# Patient Record
Sex: Male | Born: 1960 | Race: White | Hispanic: No | Marital: Single | Smoking: Former smoker
Health system: Southern US, Community
[De-identification: ages and names within clinical notes are randomized; demographics above are authoritative.]

## PROBLEM LIST (undated history)

## (undated) DIAGNOSIS — T50902A Poisoning by unspecified drugs, medicaments and biological substances, intentional self-harm, initial encounter: Secondary | ICD-10-CM

## (undated) DIAGNOSIS — I10 Essential (primary) hypertension: Secondary | ICD-10-CM

## (undated) DIAGNOSIS — F329 Major depressive disorder, single episode, unspecified: Secondary | ICD-10-CM

## (undated) DIAGNOSIS — K219 Gastro-esophageal reflux disease without esophagitis: Secondary | ICD-10-CM

## (undated) DIAGNOSIS — F32A Depression, unspecified: Secondary | ICD-10-CM

## (undated) DIAGNOSIS — G8929 Other chronic pain: Secondary | ICD-10-CM

## (undated) DIAGNOSIS — J449 Chronic obstructive pulmonary disease, unspecified: Secondary | ICD-10-CM

## (undated) DIAGNOSIS — G709 Myoneural disorder, unspecified: Secondary | ICD-10-CM

## (undated) DIAGNOSIS — F419 Anxiety disorder, unspecified: Secondary | ICD-10-CM

## (undated) HISTORY — PX: ANKLE SURGERY: SHX546

## (undated) HISTORY — PX: HERNIA REPAIR: SHX51

## (undated) HISTORY — PX: OTHER SURGICAL HISTORY: SHX169

---

## 1999-01-15 ENCOUNTER — Ambulatory Visit (HOSPITAL_COMMUNITY): Admission: RE | Admit: 1999-01-15 | Discharge: 1999-01-15 | Payer: Self-pay | Admitting: Gastroenterology

## 1999-07-02 ENCOUNTER — Other Ambulatory Visit: Admission: RE | Admit: 1999-07-02 | Discharge: 1999-07-02 | Payer: Self-pay

## 1999-11-01 ENCOUNTER — Encounter: Payer: Self-pay | Admitting: Neurosurgery

## 1999-11-05 ENCOUNTER — Encounter: Payer: Self-pay | Admitting: Neurological Surgery

## 1999-11-05 ENCOUNTER — Inpatient Hospital Stay (HOSPITAL_COMMUNITY): Admission: RE | Admit: 1999-11-05 | Discharge: 1999-11-05 | Payer: Self-pay | Admitting: Neurological Surgery

## 1999-11-21 ENCOUNTER — Encounter: Payer: Self-pay | Admitting: Neurological Surgery

## 1999-11-21 ENCOUNTER — Encounter: Admission: RE | Admit: 1999-11-21 | Discharge: 1999-11-21 | Payer: Self-pay | Admitting: Neurological Surgery

## 1999-12-27 ENCOUNTER — Encounter: Payer: Self-pay | Admitting: Neurological Surgery

## 1999-12-27 ENCOUNTER — Encounter: Admission: RE | Admit: 1999-12-27 | Discharge: 1999-12-27 | Payer: Self-pay | Admitting: Neurological Surgery

## 2000-02-11 ENCOUNTER — Encounter: Payer: Self-pay | Admitting: Neurological Surgery

## 2000-02-11 ENCOUNTER — Ambulatory Visit (HOSPITAL_COMMUNITY): Admission: RE | Admit: 2000-02-11 | Discharge: 2000-02-11 | Payer: Self-pay | Admitting: Neurological Surgery

## 2000-11-13 ENCOUNTER — Encounter: Admission: RE | Admit: 2000-11-13 | Discharge: 2000-11-13 | Payer: Self-pay | Admitting: Neurological Surgery

## 2000-11-13 ENCOUNTER — Encounter: Payer: Self-pay | Admitting: Neurological Surgery

## 2000-11-28 ENCOUNTER — Ambulatory Visit (HOSPITAL_COMMUNITY): Admission: RE | Admit: 2000-11-28 | Discharge: 2000-11-28 | Payer: Self-pay | Admitting: Neurological Surgery

## 2000-11-28 ENCOUNTER — Encounter: Payer: Self-pay | Admitting: Neurological Surgery

## 2001-07-07 ENCOUNTER — Encounter: Admission: RE | Admit: 2001-07-07 | Discharge: 2001-10-05 | Payer: Self-pay

## 2001-09-12 ENCOUNTER — Encounter: Payer: Self-pay | Admitting: Emergency Medicine

## 2001-09-12 ENCOUNTER — Emergency Department (HOSPITAL_COMMUNITY): Admission: EM | Admit: 2001-09-12 | Discharge: 2001-09-12 | Payer: Self-pay | Admitting: Emergency Medicine

## 2001-09-16 ENCOUNTER — Ambulatory Visit (HOSPITAL_BASED_OUTPATIENT_CLINIC_OR_DEPARTMENT_OTHER): Admission: RE | Admit: 2001-09-16 | Discharge: 2001-09-16 | Payer: Self-pay | Admitting: Urology

## 2001-09-16 ENCOUNTER — Encounter: Payer: Self-pay | Admitting: Urology

## 2002-12-12 ENCOUNTER — Emergency Department (HOSPITAL_COMMUNITY): Admission: EM | Admit: 2002-12-12 | Discharge: 2002-12-12 | Payer: Self-pay | Admitting: Emergency Medicine

## 2003-03-14 ENCOUNTER — Emergency Department (HOSPITAL_COMMUNITY): Admission: EM | Admit: 2003-03-14 | Discharge: 2003-03-14 | Payer: Self-pay | Admitting: Emergency Medicine

## 2003-03-23 ENCOUNTER — Emergency Department (HOSPITAL_COMMUNITY): Admission: EM | Admit: 2003-03-23 | Discharge: 2003-03-23 | Payer: Self-pay | Admitting: Emergency Medicine

## 2003-09-28 ENCOUNTER — Emergency Department (HOSPITAL_COMMUNITY): Admission: EM | Admit: 2003-09-28 | Discharge: 2003-09-28 | Payer: Self-pay

## 2003-10-11 ENCOUNTER — Emergency Department (HOSPITAL_COMMUNITY): Admission: EM | Admit: 2003-10-11 | Discharge: 2003-10-11 | Payer: Self-pay | Admitting: Emergency Medicine

## 2003-10-17 ENCOUNTER — Encounter: Admission: RE | Admit: 2003-10-17 | Discharge: 2003-10-17 | Payer: Self-pay | Admitting: Urology

## 2003-10-18 ENCOUNTER — Ambulatory Visit (HOSPITAL_BASED_OUTPATIENT_CLINIC_OR_DEPARTMENT_OTHER): Admission: RE | Admit: 2003-10-18 | Discharge: 2003-10-18 | Payer: Self-pay | Admitting: Urology

## 2003-10-18 ENCOUNTER — Ambulatory Visit (HOSPITAL_COMMUNITY): Admission: RE | Admit: 2003-10-18 | Discharge: 2003-10-18 | Payer: Self-pay | Admitting: Urology

## 2004-09-16 ENCOUNTER — Emergency Department (HOSPITAL_COMMUNITY): Admission: EM | Admit: 2004-09-16 | Discharge: 2004-09-16 | Payer: Self-pay | Admitting: *Deleted

## 2004-09-18 ENCOUNTER — Emergency Department (HOSPITAL_COMMUNITY): Admission: EM | Admit: 2004-09-18 | Discharge: 2004-09-18 | Payer: Self-pay | Admitting: Family Medicine

## 2004-09-23 ENCOUNTER — Emergency Department (HOSPITAL_COMMUNITY): Admission: EM | Admit: 2004-09-23 | Discharge: 2004-09-23 | Payer: Self-pay | Admitting: Emergency Medicine

## 2004-10-03 ENCOUNTER — Emergency Department (HOSPITAL_COMMUNITY): Admission: EM | Admit: 2004-10-03 | Discharge: 2004-10-03 | Payer: Self-pay | Admitting: Emergency Medicine

## 2004-10-13 ENCOUNTER — Emergency Department (HOSPITAL_COMMUNITY): Admission: EM | Admit: 2004-10-13 | Discharge: 2004-10-13 | Payer: Self-pay | Admitting: Emergency Medicine

## 2005-11-04 ENCOUNTER — Observation Stay (HOSPITAL_COMMUNITY): Admission: EM | Admit: 2005-11-04 | Discharge: 2005-11-04 | Payer: Self-pay | Admitting: Emergency Medicine

## 2006-01-21 ENCOUNTER — Emergency Department (HOSPITAL_COMMUNITY): Admission: EM | Admit: 2006-01-21 | Discharge: 2006-01-21 | Payer: Self-pay | Admitting: Emergency Medicine

## 2006-02-15 ENCOUNTER — Emergency Department (HOSPITAL_COMMUNITY): Admission: EM | Admit: 2006-02-15 | Discharge: 2006-02-15 | Payer: Self-pay | Admitting: Emergency Medicine

## 2006-03-29 ENCOUNTER — Emergency Department (HOSPITAL_COMMUNITY): Admission: EM | Admit: 2006-03-29 | Discharge: 2006-03-29 | Payer: Self-pay | Admitting: Emergency Medicine

## 2006-06-05 ENCOUNTER — Emergency Department (HOSPITAL_COMMUNITY): Admission: EM | Admit: 2006-06-05 | Discharge: 2006-06-06 | Payer: Self-pay | Admitting: Emergency Medicine

## 2006-06-20 ENCOUNTER — Emergency Department (HOSPITAL_COMMUNITY): Admission: EM | Admit: 2006-06-20 | Discharge: 2006-06-21 | Payer: Self-pay | Admitting: Emergency Medicine

## 2006-07-13 ENCOUNTER — Ambulatory Visit (HOSPITAL_COMMUNITY): Admission: RE | Admit: 2006-07-13 | Discharge: 2006-07-13 | Payer: Self-pay | Admitting: Gastroenterology

## 2006-07-13 ENCOUNTER — Encounter (INDEPENDENT_AMBULATORY_CARE_PROVIDER_SITE_OTHER): Payer: Self-pay | Admitting: Specialist

## 2006-07-13 ENCOUNTER — Emergency Department (HOSPITAL_COMMUNITY): Admission: EM | Admit: 2006-07-13 | Discharge: 2006-07-13 | Payer: Self-pay | Admitting: Emergency Medicine

## 2006-10-08 ENCOUNTER — Emergency Department (HOSPITAL_COMMUNITY): Admission: EM | Admit: 2006-10-08 | Discharge: 2006-10-09 | Payer: Self-pay | Admitting: Emergency Medicine

## 2006-11-18 ENCOUNTER — Emergency Department (HOSPITAL_COMMUNITY): Admission: EM | Admit: 2006-11-18 | Discharge: 2006-11-18 | Payer: Self-pay | Admitting: Emergency Medicine

## 2007-06-21 ENCOUNTER — Emergency Department (HOSPITAL_COMMUNITY): Admission: EM | Admit: 2007-06-21 | Discharge: 2007-06-22 | Payer: Self-pay | Admitting: Emergency Medicine

## 2007-12-13 ENCOUNTER — Emergency Department (HOSPITAL_COMMUNITY): Admission: EM | Admit: 2007-12-13 | Discharge: 2007-12-13 | Payer: Self-pay | Admitting: Emergency Medicine

## 2008-01-05 ENCOUNTER — Emergency Department (HOSPITAL_COMMUNITY): Admission: EM | Admit: 2008-01-05 | Discharge: 2008-01-06 | Payer: Self-pay | Admitting: Emergency Medicine

## 2008-01-06 ENCOUNTER — Ambulatory Visit: Payer: Self-pay | Admitting: Psychiatry

## 2008-01-06 ENCOUNTER — Inpatient Hospital Stay (HOSPITAL_COMMUNITY): Admission: RE | Admit: 2008-01-06 | Discharge: 2008-01-17 | Payer: Self-pay | Admitting: Psychiatry

## 2008-04-13 ENCOUNTER — Emergency Department (HOSPITAL_COMMUNITY): Admission: EM | Admit: 2008-04-13 | Discharge: 2008-04-13 | Payer: Self-pay | Admitting: *Deleted

## 2008-06-22 ENCOUNTER — Emergency Department (HOSPITAL_COMMUNITY): Admission: EM | Admit: 2008-06-22 | Discharge: 2008-06-23 | Payer: Self-pay | Admitting: *Deleted

## 2008-06-23 ENCOUNTER — Ambulatory Visit: Payer: Self-pay | Admitting: Psychiatry

## 2008-06-23 ENCOUNTER — Inpatient Hospital Stay (HOSPITAL_COMMUNITY): Admission: RE | Admit: 2008-06-23 | Discharge: 2008-07-07 | Payer: Self-pay | Admitting: Psychiatry

## 2008-07-11 ENCOUNTER — Emergency Department (HOSPITAL_COMMUNITY): Admission: EM | Admit: 2008-07-11 | Discharge: 2008-07-11 | Payer: Self-pay | Admitting: Emergency Medicine

## 2008-07-11 ENCOUNTER — Inpatient Hospital Stay (HOSPITAL_COMMUNITY): Admission: RE | Admit: 2008-07-11 | Discharge: 2008-07-26 | Payer: Self-pay | Admitting: Psychiatry

## 2009-04-03 ENCOUNTER — Emergency Department (HOSPITAL_COMMUNITY): Admission: EM | Admit: 2009-04-03 | Discharge: 2009-04-03 | Payer: Self-pay | Admitting: Emergency Medicine

## 2009-04-03 ENCOUNTER — Ambulatory Visit: Payer: Self-pay | Admitting: *Deleted

## 2009-04-04 ENCOUNTER — Inpatient Hospital Stay (HOSPITAL_COMMUNITY): Admission: RE | Admit: 2009-04-04 | Discharge: 2009-04-11 | Payer: Self-pay | Admitting: *Deleted

## 2009-04-11 ENCOUNTER — Emergency Department (HOSPITAL_COMMUNITY): Admission: EM | Admit: 2009-04-11 | Discharge: 2009-04-12 | Payer: Self-pay | Admitting: Emergency Medicine

## 2009-04-12 ENCOUNTER — Inpatient Hospital Stay (HOSPITAL_COMMUNITY): Admission: RE | Admit: 2009-04-12 | Discharge: 2009-04-25 | Payer: Self-pay | Admitting: *Deleted

## 2009-04-12 ENCOUNTER — Ambulatory Visit: Payer: Self-pay | Admitting: *Deleted

## 2009-06-01 ENCOUNTER — Emergency Department (HOSPITAL_COMMUNITY): Admission: EM | Admit: 2009-06-01 | Discharge: 2009-06-02 | Payer: Self-pay | Admitting: Emergency Medicine

## 2009-06-06 ENCOUNTER — Ambulatory Visit: Payer: Self-pay | Admitting: Vascular Surgery

## 2009-06-06 ENCOUNTER — Encounter (INDEPENDENT_AMBULATORY_CARE_PROVIDER_SITE_OTHER): Payer: Self-pay | Admitting: Internal Medicine

## 2009-06-06 ENCOUNTER — Inpatient Hospital Stay (HOSPITAL_COMMUNITY): Admission: EM | Admit: 2009-06-06 | Discharge: 2009-06-08 | Payer: Self-pay | Admitting: Emergency Medicine

## 2011-01-10 LAB — COMPREHENSIVE METABOLIC PANEL
ALT: 26 U/L (ref 0–53)
ALT: 27 U/L (ref 0–53)
AST: 18 U/L (ref 0–37)
AST: 19 U/L (ref 0–37)
Albumin: 3.8 g/dL (ref 3.5–5.2)
Alkaline Phosphatase: 65 U/L (ref 39–117)
BUN: 17 mg/dL (ref 6–23)
CO2: 28 mEq/L (ref 19–32)
CO2: 31 mEq/L (ref 19–32)
Calcium: 8.7 mg/dL (ref 8.4–10.5)
Calcium: 8.8 mg/dL (ref 8.4–10.5)
Chloride: 101 mEq/L (ref 96–112)
Chloride: 102 mEq/L (ref 96–112)
Creatinine, Ser: 1.05 mg/dL (ref 0.4–1.5)
GFR calc Af Amer: >60 mL/min (ref >60)
GFR calc Af Amer: >60 mL/min (ref >60)
GFR calc non Af Amer: >60 mL/min (ref >60)
GFR calc non Af Amer: >60 mL/min (ref >60)
Glucose, Bld: 103 mg/dL — ABNORMAL HIGH (ref 70–99)
Glucose, Bld: 106 mg/dL — ABNORMAL HIGH (ref 70–99)
Potassium: 3.8 mEq/L (ref 3.5–5.1)
Sodium: 137 mEq/L (ref 135–145)
Sodium: 138 mEq/L (ref 135–145)
Total Bilirubin: 0.5 mg/dL (ref 0.3–1.2)
Total Bilirubin: 0.6 mg/dL (ref 0.3–1.2)
Total Protein: 6.9 g/dL (ref 6.0–8.3)

## 2011-01-10 LAB — CARDIOLIPIN ANTIBODIES, IGG, IGM, IGA
Anticardiolipin IgA: <10 APL U/mL — ABNORMAL LOW (ref <10)
Anticardiolipin IgG: <10 GPL U/mL — ABNORMAL LOW (ref <10)
Anticardiolipin IgM: <10 MPL U/mL — ABNORMAL LOW (ref <10)

## 2011-01-10 LAB — CBC
HCT: 44.6 % (ref 39.0–52.0)
Hemoglobin: 14.2 g/dL (ref 13.0–17.0)
Hemoglobin: 14.9 g/dL (ref 13.0–17.0)
MCHC: 33.4 g/dL (ref 30.0–36.0)
MCHC: 34.1 g/dL (ref 30.0–36.0)
MCV: 91 fL (ref 78.0–100.0)
MCV: 91.3 fL (ref 78.0–100.0)
Platelets: 199 10*3/uL (ref 150–400)
RBC: 4.58 MIL/uL (ref 4.22–5.81)
RBC: 4.88 MIL/uL (ref 4.22–5.81)
RDW: 13.5 % (ref 11.5–15.5)
WBC: 5.7 10*3/uL (ref 4.0–10.5)
WBC: 5.9 10*3/uL (ref 4.0–10.5)

## 2011-01-10 LAB — LUPUS ANTICOAGULANT PANEL
DRVVT: 46.3 secs — ABNORMAL HIGH (ref 34.7–40.5)
Lupus Anticoagulant: NOT DETECTED
PTT Lupus Anticoagulant: 200 secs — ABNORMAL HIGH (ref 32.0–43.4)
PTTLA 4:1 Mix: 141.8 secs — ABNORMAL HIGH (ref 36.3–48.8)
PTTLA Confirmation: 0.4 secs (ref <8.0)
dRVVT Incubated 1:1 Mix: 36.8 secs (ref 36.1–47.0)

## 2011-01-10 LAB — BETA-2-GLYCOPROTEIN I ABS, IGG/M/A
Beta-2 Glyco I IgG: <9 SGU (ref <=20)
Beta-2-Glycoprotein I IgA: <9 SAU (ref <=20)
Beta-2-Glycoprotein I IgM: <9 SMU (ref <=20)

## 2011-01-10 LAB — LIPID PANEL
Cholesterol: 210 mg/dL — ABNORMAL HIGH (ref 0–200)
HDL: 32 mg/dL — ABNORMAL LOW (ref >39)
Total CHOL/HDL Ratio: 6.6 RATIO

## 2011-01-10 LAB — ANTITHROMBIN III: AntiThromb III Func: 110 % (ref 76–126)

## 2011-01-10 LAB — PROTIME-INR
INR: 0.9 (ref 0.00–1.49)
Prothrombin Time: 12.3 seconds (ref 11.6–15.2)
Prothrombin Time: 12.7 seconds (ref 11.6–15.2)
Prothrombin Time: 13.2 seconds (ref 11.6–15.2)

## 2011-01-10 LAB — TSH: TSH: 1.283 u[IU]/mL (ref 0.350–4.500)

## 2011-01-10 LAB — PROTEIN C ACTIVITY: Protein C Activity: 190 % — ABNORMAL HIGH (ref 75–133)

## 2011-01-10 LAB — APTT: aPTT: 28 seconds (ref 24–37)

## 2011-01-10 LAB — GLUCOSE, CAPILLARY: Glucose-Capillary: 114 mg/dL — ABNORMAL HIGH (ref 70–99)

## 2011-01-10 LAB — HEMOGLOBIN A1C
Hgb A1c MFr Bld: 5.5 % (ref 4.6–6.1)
Mean Plasma Glucose: 111 mg/dL

## 2011-01-10 LAB — FACTOR 5 LEIDEN

## 2011-01-10 LAB — HOMOCYSTEINE: Homocysteine: 10.2 umol/L (ref 4.0–15.4)

## 2011-01-10 LAB — PROTEIN S ACTIVITY: Protein S Activity: 102 % (ref 69–129)

## 2011-01-10 LAB — PROTEIN C, TOTAL: Protein C, Total: 120 % (ref 70–140)

## 2011-01-10 LAB — PROTEIN S, TOTAL: Protein S Ag, Total: 95 % (ref 70–140)

## 2011-01-11 LAB — DIFFERENTIAL
Basophils Absolute: 0.1 10*3/uL (ref 0.0–0.1)
Basophils Relative: 2 % — ABNORMAL HIGH (ref 0–1)
Eosinophils Absolute: 0.2 10*3/uL (ref 0.0–0.7)
Lymphocytes Relative: 23 % (ref 12–46)
Lymphocytes Relative: 31 % (ref 12–46)
Lymphs Abs: 3 10*3/uL (ref 0.7–4.0)
Monocytes Relative: 5 % (ref 3–12)
Neutro Abs: 4.8 10*3/uL (ref 1.7–7.7)
Neutrophils Relative %: 70 % (ref 43–77)
Neutrophils Relative %: 56 % (ref 43–77)

## 2011-01-11 LAB — URINALYSIS, ROUTINE W REFLEX MICROSCOPIC
Bilirubin Urine: NEGATIVE
Glucose, UA: NEGATIVE mg/dL
Hgb urine dipstick: NEGATIVE
Specific Gravity, Urine: 1.009 (ref 1.005–1.030)
pH: 5.5 (ref 5.0–8.0)

## 2011-01-11 LAB — RAPID URINE DRUG SCREEN, HOSP PERFORMED
Amphetamines: NOT DETECTED
Barbiturates: NOT DETECTED
Benzodiazepines: NOT DETECTED
Cocaine: NOT DETECTED
Opiates: NOT DETECTED
Tetrahydrocannabinol: NOT DETECTED

## 2011-01-11 LAB — POCT CARDIAC MARKERS
CKMB, poc: 1.5 ng/mL (ref 1.0–8.0)
Myoglobin, poc: 99.2 ng/mL (ref 12–200)
Troponin i, poc: <0.05 ng/mL (ref 0.00–0.09)

## 2011-01-11 LAB — COMPREHENSIVE METABOLIC PANEL
Albumin: 3.6 g/dL (ref 3.5–5.2)
Alkaline Phosphatase: 61 U/L (ref 39–117)
BUN: 16 mg/dL (ref 6–23)
CO2: 26 mEq/L (ref 19–32)
Chloride: 106 mEq/L (ref 96–112)
Creatinine, Ser: 1.12 mg/dL (ref 0.4–1.5)
GFR calc non Af Amer: >60 mL/min (ref >60)
Glucose, Bld: 104 mg/dL — ABNORMAL HIGH (ref 70–99)
Total Bilirubin: 0.4 mg/dL (ref 0.3–1.2)

## 2011-01-11 LAB — CBC
HCT: 45.5 % (ref 39.0–52.0)
HCT: 44.6 % (ref 39.0–52.0)
Hemoglobin: 15.2 g/dL (ref 13.0–17.0)
MCV: 90.5 fL (ref 78.0–100.0)
MCV: 90.5 fL (ref 78.0–100.0)
Platelets: 200 10*3/uL (ref 150–400)
RBC: 5.03 MIL/uL (ref 4.22–5.81)
WBC: 13.1 10*3/uL — ABNORMAL HIGH (ref 4.0–10.5)
WBC: 8.5 10*3/uL (ref 4.0–10.5)

## 2011-01-11 LAB — BASIC METABOLIC PANEL
Chloride: 105 mEq/L (ref 96–112)
Creatinine, Ser: 1.13 mg/dL (ref 0.4–1.5)
Potassium: 4 mEq/L (ref 3.5–5.1)

## 2011-01-11 LAB — LIPASE, BLOOD: Lipase: 41 U/L (ref 11–59)

## 2011-01-12 LAB — DIFFERENTIAL
Basophils Absolute: 0.1 10*3/uL (ref 0.0–0.1)
Eosinophils Relative: 1 % (ref 0–5)
Lymphocytes Relative: 30 % (ref 12–46)
Lymphs Abs: 2.4 10*3/uL (ref 0.7–4.0)
Monocytes Absolute: 0.5 10*3/uL (ref 0.1–1.0)
Monocytes Relative: 7 % (ref 3–12)

## 2011-01-12 LAB — BASIC METABOLIC PANEL
Calcium: 9.2 mg/dL (ref 8.4–10.5)
GFR calc non Af Amer: 54 mL/min — ABNORMAL LOW (ref >60)
Glucose, Bld: 116 mg/dL — ABNORMAL HIGH (ref 70–99)
Potassium: 4 mEq/L (ref 3.5–5.1)
Sodium: 138 mEq/L (ref 135–145)

## 2011-01-12 LAB — RAPID URINE DRUG SCREEN, HOSP PERFORMED
Amphetamines: NOT DETECTED
Tetrahydrocannabinol: NOT DETECTED

## 2011-01-12 LAB — CBC
HCT: 47 % (ref 39.0–52.0)
Hemoglobin: 15.8 g/dL (ref 13.0–17.0)
RDW: 13.4 % (ref 11.5–15.5)

## 2011-01-12 LAB — ETHANOL: Alcohol, Ethyl (B): <5 mg/dL (ref 0–10)

## 2011-01-13 LAB — CBC
MCHC: 33.7 g/dL (ref 30.0–36.0)
MCV: 88.7 fL (ref 78.0–100.0)
Platelets: 229 10*3/uL (ref 150–400)
RDW: 12.7 % (ref 11.5–15.5)

## 2011-01-13 LAB — URINALYSIS, ROUTINE W REFLEX MICROSCOPIC
Glucose, UA: NEGATIVE mg/dL
Leukocytes, UA: NEGATIVE
Nitrite: NEGATIVE
Protein, ur: 30 mg/dL — AB

## 2011-01-13 LAB — BASIC METABOLIC PANEL
BUN: 18 mg/dL (ref 6–23)
CO2: 22 mEq/L (ref 19–32)
Chloride: 110 mEq/L (ref 96–112)
Creatinine, Ser: 1.56 mg/dL — ABNORMAL HIGH (ref 0.4–1.5)
Glucose, Bld: 117 mg/dL — ABNORMAL HIGH (ref 70–99)

## 2011-01-13 LAB — DIFFERENTIAL
Eosinophils Absolute: 0 10*3/uL (ref 0.0–0.7)
Lymphs Abs: 2.1 10*3/uL (ref 0.7–4.0)
Monocytes Absolute: 0.7 10*3/uL (ref 0.1–1.0)
Neutrophils Relative %: 71 % (ref 43–77)

## 2011-01-13 LAB — RAPID URINE DRUG SCREEN, HOSP PERFORMED
Barbiturates: NOT DETECTED
Cocaine: NOT DETECTED
Opiates: NOT DETECTED
Tetrahydrocannabinol: NOT DETECTED

## 2011-01-13 LAB — URINE MICROSCOPIC-ADD ON

## 2011-02-18 NOTE — Discharge Summary (Signed)
NAME:  Jeffrey [name], Jeffrey [name]             ACCOUNT NO.:  [id number]   MEDICAL RECORD NO.:  [id number]         PATIENT TYPE:  COBC   LOCATION:                               FACILITY:  [facility]   PHYSICIAN:  Peggye Pitt, M.D. DATE OF BIRTH:  1961/08/19   DATE OF ADMISSION:  06/06/2009  DATE OF DISCHARGE:  06/07/2009                               DISCHARGE SUMMARY   DISCHARGE DIAGNOSES:  1. Acute pulmonary embolism.  2. Hypertension.  3. Major depressive disorder.  4. Chronic back and neck pain.  5. History of cocaine abuse.  6. Hyperlipidemia.   His discharge medications include:  1. Coumadin 10 mg daily until instructed differently by Dr. Earlene Plater.  2. Lovenox 150 mg injected subcutaneously daily until directed by Dr.      Earlene Plater.  3. Oxycodone 5 mg every 6 hours as needed for pain, I have dispensed      #15 tablets.  4. Neurontin 800 mg 3 times a day.  5. Aspirin 81 mg daily.  6. Hydroxyzine 25 mg daily and 50 mg at bedtime.  7. Abilify 2 mg daily.  8. Asacol 400 mg 3 times a day.  9. Pristiq 100 mg daily.  10.Trazodone 50 mg at bedtime.  11.Omeprazole 20 mg daily.  12.Multivitamin 1 tablet daily.  13.Fish oil 1200 mg 1 tablet daily.  14.Vitamin D 5000 units weekly on Thursdays.  15.Senokot 2 tablets at bedtime.  16.Tramadol 50 mg every 8 hours as needed.  17.Soma 350 mg 1 tablet 3 times a day as needed for muscle spasm.  18.Red yeast rice 600 mg 2 times a day.  19.Melatonin 3 mg at bedtime.  20.Hydrocortisone cream 2.5% apply rectally to hemorrhoids as needed.   DISPOSITION AND FOLLOWUP:  Jeffrey [name] will be discharged home today in  stable condition.  Via our case manager, we have arranged home health to  come out to his house to draw his PT/INRs every other day starting  tomorrow.  These will be called in to his primary care physician, Dr.  Louanna Raw, who will then give further instructions regarding the  Lovenox and Coumadin dosing.   CONSULTATION IN THIS  HOSPITALIZATION:  None.   Images and procedures include:  1. A chest x-ray on June 05, 2009, that showed bibasilar atelectasis      and COPD.  2. A CT angiogram of the chest on June 06, 2009, that showed      pulmonary emboli in the right lower lobe.  Stable, scattered,      subcentimeter pulmonary nodules stable for 3-1/2 years and      therefore, are likely benign.   HISTORY AND PHYSICAL EXAMINATION:  For full details, please refer to  dictation on June 06, 2009, by Dr. Ninfa Linden, but in brief, Jeffrey [name]  is a pleasant 50 year old Caucasian gentleman with a history of major  depressive disorder and multiple admissions to behavioral health, who  presented to the hospital with a 3-hour history of a right-sided chest  pain that he described as acute onset, worse with exertion that improved  by sublingual nitroglycerin given in the emergency department.  A D-  dimer was drawn which was elevated and hence his chest was scanned which  showed evidence of pulmonary emboli and hence we were called to admit  him for further evaluation and management.   HOSPITAL COURSE:  1. Pulmonary embolism.  He has been started on therapeutic doses of      Lovenox at 150 mg injected subcutaneously once daily as well as      Coumadin 10 mg daily.  His INR on the date of discharge is still      subtherapeutic at 1.0.  He is on Lovenox day #2.  I would continue      the Lovenox for at least 5 days, but also would continue 2 days      after INR has become therapeutic on Coumadin.  As stated above,      [facility] will be drawing the PT/INRs every other day      starting tomorrow and we will be calling this in to his primary      care physician, Dr. Earlene Plater, who will be making the dosing      adjustments.  2. His hypertension has been well controlled throughout this      hospitalization.  3. For his depression, we have continued all of his psychiatric      medications.  Rest of chronic medical  conditions have been stable      in this hospitalization.  None of his home medications have been      altered.   VITAL SIGNS ON DAY OF DISCHARGE:  Blood pressure 132/76, heart rate 73,  respirations 20, O2 sats 95% on room air with a temperature of 97.6.   LABORATORY DATA ON DAY OF DISCHARGE:  Sodium 137, potassium 4.3,  chloride 101, bicarb 31, BUN 14, creatinine 1.06 with a glucose of 106  and albumin of 3.3.  WBCs 5.7, hemoglobin 14.2, and a platelet count of  179.  An INR is 1.0.      Peggye Pitt, M.D.  Electronically Signed     EH/MEDQ  D:  06/07/2009  T:  06/08/2009  Job:  [id number]   cc:   Louanna Raw
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
## 2011-02-18 NOTE — H&P (Signed)
NAME:  Jeffrey [name], BOVEY NO.:  [id number]   MEDICAL RECORD NO.:  [id number]          PATIENT TYPE:  IPS   LOCATION:  0304                          FACILITY:  [facility]   PHYSICIAN:  Geoffery Lyons, M.D.      DATE OF BIRTH:  [birth year]   DATE OF ADMISSION:  06/23/2008  DATE OF DISCHARGE:                       PSYCHIATRIC ADMISSION ASSESSMENT   Jeffrey [name] presented to the emergency department at [facility].  This  was a little after midnight last night.  Jeffrey [name] stated that his depression  was increasing.  Jeffrey [name] states Jeffrey [name] cannot sleep, eat, concentrate. Jeffrey [name]  appeared very anxious and nervous.  Jeffrey [name] denied any suicidal ideation but  Jeffrey [name] acknowledged feeling homicidal towards this brother.  Jeffrey [name] denied any  auditory or visual hallucinations. Jeffrey [name] denied any substance abuse.  At  that point in time Jeffrey [name] denied any symptoms of migraine and Jeffrey [name] stated that  his depression started increasing approximately three weeks ago.  Mr.  Schaner was last with [place] 01/06/2008 to 01/17/2008.  Jeffrey [name] was at [facility] back in October of 2008. Jeffrey [name] states at that time Jeffrey [name] was treated  with Prozac and Celexa.  Jeffrey [name] stated that these medications made him feel  nervous. Jeffrey [name] was discharged from our facility on Effexor.  The dose has  been lowered however Jeffrey [name] states the Effexor was very helpful towards his  anxiety. Jeffrey [name] acknowledges that Jeffrey [name] may in fact need a mood stabilizer and  we will be looking at that on this admission.   SOCIAL HISTORY:  Jeffrey [name] reports having two B.S. degrees. Jeffrey [name] was awarded  [employer] disability for an injury nine years ago. Jeffrey [name]  continues to have neck pain and migraines from this injury.  Jeffrey [name] did have  a C6/C7 fusion. Currently Jeffrey [name] is without income. Jeffrey [name] states Jeffrey [name] has had no  income since 07/01/2006. Jeffrey [name] has a 17 year old son and Jeffrey [name] lives with his  mother whom Jeffrey [name] helps take care of. Jeffrey [name] reports his mother has dementia.   FAMILY HISTORY:  Jeffrey [name] denies alcohol and drug history.  Jeffrey [name] denies using  any  substances.   His primary care Eliska Hamil is Dr. Louanna Raw.  Jeffrey [name] is followed  psychiatrically at [facility] under the direct of Dr.  Lelon Perla and his neurologist is Dr. Isabell Jarvis in [place].   MEDICAL PROBLEMS:  Chronic pain and migraine headaches.   DRUG ALLERGIES:  Jeffrey [name] states that Valium did not agree with him,  Levaquin gave him diarrhea, acetaminophen, ketoprofen and the Levaquin  also gave him GI bleeds.   POSITIVE PHYSICAL FINDINGS:  Jeffrey [name] was medically cleared with the Emergency  Department at [facility]. Jeffrey [name] had no alcohol. His UDS was negative.  His  vital signs on admission to the unit show that Jeffrey [name] is 5'11, weighs 201.  His temperature is 98.3, blood pressure was 136/96 to 139/99.  Respirations were 16 and pulse was 89.  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 In 1972 and 1974 Jeffrey [name] had  orthopedic stabilization of his right ankle. In 1986 right ankle  and  right leg fracture.  In 2002, 2004, 2007 kidney stones and 2001 was his  cervical fusion.  Today Jeffrey [name] reports that although Jeffrey [name] has had his prostate  evaluated by Dr. Earlene Plater, Jeffrey [name] is still having issues with his stream and Jeffrey [name]  wonders if this is not a side effect to the Effexor.   MENTAL STATUS EXAM:  Today Jeffrey [name] is alert and oriented.  Jeffrey [name] is  appropriately groomed, dressed and nourished. His speech is not  pressured. His mood is somewhat irritable and anxious. Jeffrey [name] reports that  Jeffrey [name] had a migraine headache earlier today and Jeffrey [name] is finally getting  relief from his Frova.  Thought processes are clear, rational and goal  oriented. Jeffrey [name] knows Jeffrey [name] needs to get his meds adjusted.  Judgment and  insight are intact. Concentration and memory superficially at least are  intact.  Intelligence is at least average.  Jeffrey [name] denies being suicidal. Jeffrey [name]  does report having felt that Jeffrey [name] could be homicidal towards his brother  and Jeffrey [name] denies any auditory or visual hallucinations.   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  Axis II:  Rule out personality disorder.  Axis III: C6/C7 fusion, history for multiple kidney stones, migraine  headaches.  Axis V:  Economic issues, occupational issues, problems with primary  support.  Axis V:  39.   The plan is to admit for safety and stabilization, to evaluate starting  a mood stabilizer and to evaluate whether to continue with Effexor or  not as the patient thinks that Jeffrey [name] has side effects.  Estimated length of  stay is three to five days.      Mickie Leonarda Salon, P.A.-C.      Geoffery Lyons, M.D.  Electronically Signed    MD/MEDQ  D:  06/24/2008  T:  06/24/2008  Job:  [id number]
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
## 2011-02-21 NOTE — Consult Note (Signed)
[facility]  Patient:    SAVION, WASHAM Visit Number: [id number] MRN: [id number]          Service Type: PMG Location: TPC Attending Physician:  Sondra Come Dictated by:   Jewel Baize Stevphen Rochester, M.D. Admit Date:  07/07/2001   CC:         Varney Biles, [facility] rehabilitation case manager; fax [phone]                          Consultation Report  Andrew Blasius is a patient referred to [place] by Dr. Dorna Bloom and I have personally discussed him with Dr. Quintella Reichert.  Specifically, Commodore Bellew was seen on August 03, 2001 and after an extensive consultation and agreement as to directed goal as well as our treatment profile, he reported to his primary care physicians office and requested pain medications.  We had decided within the benefit of the doubt to provide him with ______, perform an interventional procedure, and move through our decision tree to enhance functional indexes and quality of life indexes, but more importantly goal directed care.  Inappropriate illness behavior is demonstrated as the patient is self directing his own care.  This is a persistent pattern in Mr. Andersons presentation and he has been noncompliant with our intentions at his best interest.  As I discussed with Dr. Quintella Reichert, Mr. Hammond had signed a patient care agreement and had verbalized with [place] that he understood the implications of this agreement and he had expressly stated that he would not move in a self directed fashion.  When he did not receive his hydrocodone he apparently sought this drug and this is in violation of our patient/physician relationship.  Mr. Mckeithan did accept a prescription from Dr. Quintella Reichert and I am not sure that he was forthright with Dr. Quintella Reichert in this regard.  We have only the best interest of Mr. Hudspeth in mind.  I do think that Mr. Rossman should consider our efforts to return him to work, as clearly he is not a disability candidate, and elements  of self directed care and symptom magnification appear to be present.  It is also mandatory that anger management or some type of psychological assistance be integrated in our plan as he has shown his unpredictable nature on many occasions and has been aggressive toward staff as well as well meaning and intention to individuals such as his case Production designer, theatre/television/film.  We will certainly leave the door open to him but as I understand, Mr. Sorg is seeking care outside this clinic, specifically looking toward Dr. Metta Clines to be performing interventional procedures and I think that this is outside of the workmans compensation arena.  Dr. Metta Clines is a fine practitioner and can offer many services to this individual but as the importance of a workmans compensation patient/physician relationship, especially one involving prescription medications dictates, a reliable and consistent treatment plan should be enforced.  We would strongly support return to work environment and as it appears that Mr. Crocket is self directing care and working outside of our [employer], I would imagine we could only state that he is at [facility].  We will see him in follow-up if he requests. Dictated by:   Jewel Baize Stevphen Rochester, M.D. Attending Physician:  Sondra Come DD:  08/24/01 TD:  08/24/01 Job: [id number]

## 2011-02-21 NOTE — Discharge Summary (Signed)
[facility]  Patient:    [name]                     MRN: [id number] Adm. Date:  [id number] Disc. Date: [id number] Attending:  Jonne Ply Dictator:   Stefani Dama, M.D.                           Discharge Summary  ADMITTING DIAGNOSIS: 1.  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  DISCHARGE DIAGNOSIS: 1.  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  OPERATION: 1.  Anterior cervical diskectomy and arthrodesis, C5-6 and C6-7.  DISCHARGE CONDITION:  Improving.  HOSPITAL COURSE:  The patient is a 50 year old individual who complained of neck, shoulder, arm pain, and tinnitus since a work-related accident several months ago. The patient had significant spondylitic disease at C5-6 and C6-7 with an additional herniated nucleus pulposus and cervical radiculopathic complaints.  He underwent anterior diskectomy and arthrodesis and tolerated the procedure well.  He notes  postoperatively that his tinnitus is gone from his ear.  He is pleased with his  progress at this time.  He is taking oral medication and ambulating.  He has voided without difficulty.  DISCHARGE MEDICATIONS: 1.  Prescription for Valium and Vicodin as-needed for pain.  DISPOSITION:  He has been advised as to his activities and will be seen in the office in three weeks time. DD:  11/05/99 TD:  11/06/99 Job: [id number]

## 2011-02-21 NOTE — Op Note (Signed)
NAME:  [name], [name]                       ACCOUNT NO.:  [id number]   MEDICAL RECORD NO.:  [id number]                   PATIENT TYPE:  AMB   LOCATION:  NESC                                 FACILITY:  [facility]   PHYSICIAN:  Rozanna Boer., M.D.      DATE OF BIRTH:  January 23, 1961   DATE OF PROCEDURE:  10/18/2003  DATE OF DISCHARGE:                                 OPERATIVE REPORT   PREOPERATIVE DIAGNOSIS:  Left distal ureteral stone with obstruction.   POSTOPERATIVE DIAGNOSIS:  Left distal ureteral stone with obstruction.   OPERATION:  Left ureteroscopy with stone removal.   BRIEF HISTORY:  This 50 year old disabled white male is admitted with  persistent left distal ureteral stone that has been present since September 28, 2003, slowly moving down the ureter.  He has had two visits to the ER,  and for non-movement of the stone he enters now for removal.  He had a C6-7  fusion in 2001 for a work-related injury and is disabled from this at this  time.  He had a previous stone December 2002.   The patient was placed on the operating table in a dorsal lithotomy  position, after satisfactory induction of general anesthesia was prepped and  draped with Betadine in the usual sterile fashion.  He was given IV  antibiotics.  No anterior strictures were seen.  The posterior urethra was  nonobstructing and the bladder was entered.  No bladder mucosal lesions were  seen.  A stone was seen just inside the orifice about a centimeter or two up  in the orifice.  I could not pass a guidewire past the stone, so I then  passed a Glidewire and then passed the open-ended catheter beyond that and  was able to remove the Glidewire and inserted a guidewire.  In removing the  scope the guidewire inadvertently fell out but was able to be reinserted.  I  then dilated the distal ureter with a 4 cm ureteral balloon at 12  atmospheres pressure for three timed minutes.  After this the short 6  ureteroscope  was able to be negotiated past the stone and then with a basket  was able to entrap the stone and remove it intact.  The scope easily passed  in and out from the orifice, which had been dilated by the balloon, and I  did not think he needed  a stent placement.  The bladder was drained, a B&O  suppository inserted, and the patient taken to the recovery room in good  condition, and to be later discharged as an outpatient.                                               Rozanna Boer., M.D.    HMK/MEDQ  D:  10/18/2003  T:  10/18/2003  Job:  [id number]

## 2011-02-21 NOTE — Discharge Summary (Signed)
NAME:  [name], [name] NO.:  [id number]   MEDICAL RECORD NO.:  [id number]          PATIENT TYPE:  IPS   LOCATION:  0503                          FACILITY:  [facility]   PHYSICIAN:  Geoffery Lyons, M.D.      DATE OF BIRTH:  Sep 02, 1961   DATE OF ADMISSION:  01/06/2008  DATE OF DISCHARGE:  01/17/2008                               DISCHARGE SUMMARY   CHIEF COMPLAINT AND HISTORY OF PRESENT ILLNESS:  This was the first  admission to [facility] for this 50 year old male  divorced, voluntarily admitted.  Presented being in the ED complaining  of significant depression.  He could not be safe at home, has been  having suicidal thoughts on and off for about a week.  Endorsed for the  past two weeks increasing insomnia, feeling more edgy, irritable,  concentration decreased, generally dysphoric.  Some psychosocial  stressors, continued unemployment since workman's comp accident in 2000,  some financial stressors, difficulty with the workers compensation  company setting his case, caring for his 23 year old mother, history of  prior suicidal thoughts with an active attempt in October 2008. At that  time, he was taking Prozac which initially worked and then cause  sleepiness.   PAST PSYCHIATRIC HISTORY:  First time at [employer].  He was in  [facility] from October 15 to July 24, 2007.  At that time, he had  become agitated with his brother, anxious, fear he would hurt him, was  started on Prozac which subsequently he stopped.  He is also taking  Celexa and Lexapro.  He had a good response to Effexor. He was also  taking Seroquel in the past in 2003 with Effexor and seemed to have  worked well.  He denies active use of any substances.   MEDICAL HISTORY:  Chronic neck pain status post cervical fusion,  migraine headaches, ulcerative colitis.   MEDICATIONS:  Prevacid 30 mg per day, Ultram 300 mg daily, Asacol 400 mg  3 pills twice a day, Neurontin 300 mg two  tabs three times a day, Soma  250 one three times a day as needed, aspirin 81 mg per day, Crestor 20  mg per day, tramadol 50 mg one every 6-8 hours as needed for break  through pain, melatonin 3 mg at bedtime as needed,  Frova 2.5 one tab at  onset of headache, Proctosol HC foam, Omega fish oil, Peri-Colace 2 tabs  at night.   PHYSICAL EXAMINATION:  Physical exam failed to show any acute findings.   LABORATORY WORK:  CBC with white blood cells 8.2, hemoglobin 17.  UDS  negative for all substances.  Sodium 140, potassium 4, BUN 15,  creatinine 1.4, glucose 100, hemoglobin 18.4.   MENTAL STATUS EXAM:  Reveals an alert cooperative male.  Speech is  normal in rate, ten point production.  Mood is depressed.  Affect is  anxious.  Thought process logical, coherent, and relevant.  He is  deliberate in his review of his medications.  Lots of concerns about  going on new medication given the fact that he is on a lot.  Feeling  depressed, becomes irritable easy.  Endorsing he has some irritability.  He has had base suicidal thoughts in the course of the past two weeks.  No active delusions.  No active homicidal ideations.  No hallucinations.  Cognition well preserved.   ADMISSION DIAGNOSIS:  AXIS I:       Major depression, recurrent.  AXIS II:    No diagnosis.  AXIS III:   Chronic neck pain, migraine headaches.  AXIS IV:    Moderate.  AXIS V:     On admission 48, highest in the last year 70.   COURSE IN THE HOSPITAL:  The patient was admitted.  He was started in  individual and group psychotherapy.  He was maintained on his  medication. He was restarted on Effexor XR that he started tolerating  quite well. As already stated, he was in a job injury cervical whiplash,  cervical fusion, has had kidney stones, placed on opiates, mood  relaxants, headache medications, anticonvulsants, no income for over two  years, has nerve damage.  He continued to deal with the depression  dealing with the  frustration of having to deal with the workman's comp  people. Would like to continue to see a counselor to deal with his  issues.  We worked on Pharmacologist. As the hospitalization progressed,  he started feeling better mood wise and less anxious.  He continued to  worry about the depression.  He wanted to get the medications adjusted.  He had some swelling of his feet.  He felt that he overdid it activity  wise. He continued to tolerate the Effexor, only some blurred vision but  he started getting used to it. Continued to ruminate about his life, his  pain, his trauma, his demands. We increased the Effexor. On April 9,  endorsed that he might be starting to get a little bit more clear  headed, not as much difficulty with the memory Effexor was up to 112.5  mg per day.  Continued to ruminate about the antidepressants. There was  some sweating but over admission, got better. Continued to ruminate  about the situation with the workman's comp.  Eventually, the swelling  got better.  We were able to get up to Effexor 150 mg per day. On April  13, he was in full contact with reality.  There were no active suicidal  ideas, no hallucinations or delusions.  Objectively better, less worry,  less ruminative, mood improved, affect brighter.  We went ahead and  discharged to outpatient follow-up.   DISCHARGE DIAGNOSIS:  AXIS I:       Major depression, recurrent, anxiety  disorder not otherwise specified.  AXIS II:    No diagnosis.  AXIS III:   Chronic neck pain, migraine headaches.  AXIS IV:    Moderate.  AXIS V:     On discharge, 55-60.   Discharged on Effexor XR 150 mg per day, Prevacid 30 mg per day, Ultram  ER 300 mg per day, Asacol 400 mg three times a day, Neurontin 300, 2-3  times a day, aspirin 81 mg per day, Crestor 20 mg per day, Omega fish  oil, Peri-Colace two at night, melatonin 3-6 mg at night, Metamucil,  Soma as needed, Frova for headaches. Follow up with Dr. Lang Snow and Francie Massing for counseling.      Geoffery Lyons, M.D.  Electronically Signed     IL/MEDQ  D:  02/16/2008  T:  02/16/2008  Job:  [id number]

## 2011-02-21 NOTE — Op Note (Signed)
NAME:  [name], Jeffrey [name]             ACCOUNT NO.:  [id number]   MEDICAL RECORD NO.:  [id number]          PATIENT TYPE:  AMB   LOCATION:  ENDO                         FACILITY:  [facility]   PHYSICIAN:  John C. Madilyn Fireman, M.D.    DATE OF BIRTH:  11/16/1960   DATE OF PROCEDURE:  07/13/2006  DATE OF DISCHARGE:  07/13/2006                                 OPERATIVE REPORT   INDICATIONS FOR PROCEDURE:  Dyspepsia refractory proton-pump inhibitors, and  the patient also undergoing colonoscopy today due to rectal bleeding and  family history of colon cancer.   PROCEDURE:  The patient was placed in the left lateral decubitus position  and placed on the pulse monitor with continuous low-flow oxygen delivered by  nasal cannula.  He was sedated with 100 mcg IV fentanyl, 8 mg IV Versed and  25 mg IV Phenergan.  The Olympus video endoscope was advanced under direct  vision into the oropharynx and esophagus.  The esophagus was straight and of  normal caliber with the squamocolumnar line at 38 cm.  There was no visible  hiatal hernia, ring stricture or other abnormality of the GE junction.  Stomach was entered, and a small amount of liquid secretions were suctioned  from the fundus.  Retroflexed view of the cardia was unremarkable.  The  fundus and body appeared normal.  The antrum showed some mild patchy  erythema and granularity consistent with mild antral gastritis with no focal  erosions or ulcers.  The pylorus was not deformed and easily allowed passage  of the endoscope tip into the duodenum.  Both the bulb and second portion  were well inspected and appeared be within normal limits.  The scope was  then withdrawn back into the stomach and a CLO-test obtained.  The scope was  then withdrawn, and the patient returned to the recovery room in stable  condition.  He tolerated the procedure well.  There were no immediate  complications.   IMPRESSION:  Antral gastritis.   PLAN:  Await CLO-test.     ______________________________  Everardo All. Madilyn Fireman, M.D.     JCH/MEDQ  D:  07/13/2006  T:  07/14/2006  Job:  [id number]   cc:   Louanna Raw

## 2011-02-21 NOTE — Discharge Summary (Signed)
NAME:  [name], Jeffrey [name] NO.:  [id number]   MEDICAL RECORD NO.:  [id number]          PATIENT TYPE:  IPS   LOCATION:  0500                          FACILITY:  [facility]   PHYSICIAN:  Jasmine Pang, M.D. DATE OF BIRTH:  02/09/1961   DATE OF ADMISSION:  04/12/2009  DATE OF DISCHARGE:  04/25/2009                               DISCHARGE SUMMARY   IDENTIFICATION:  This is a 50 year old single white male who was  admitted on a voluntary basis on April 12, 2009.   HISTORY OF PRESENT ILLNESS:  This is one of several [facility] admissions for  this 50 year old with a history of major depression with anxiety  features.  He is followed as an outpatient by [facility].  He presented in the emergency room 2 hours after being  discharged from [facility].  He had been on our unit for a week dealing with  anxiety and depression.  He was also having some anger towards his  brother.  He had been feeling that his medications were not working and  needed to be adjusted.  We added 37.5 mg of Effexor a day to his daily  dose of Pristiq 50 mg daily.  After discharge, he went home and found  out that his brother with him he had significant conflict, had moved out  all of his belongings to the street. His mother with him he lives was  being transferred to an assisted living.  The patient fears now that he  is homeless and unable to have access to the home that he has occupied.  There is no history of substance abuse.  The patient is followed by  Belva Agee at [facility].  He was recently  discharged from [facility] on April 11, 2009.  He has a history of major  depression with anxiety features.  He has a history of previous suicide  attempts including suicide attempt by overdose, depression onset dates  back to a work accident in 2001, which left him disabled due to a neck  injury.  For further admission information, see psychiatric admission  assessment.  Initially an axis  I diagnosis of major depression,  recurrent, severe without psychosis was given.  On axis III, he was  diagnosed with migraine headache and gastroesophageal reflux disease.   HOSPITAL COURSE:  Upon admission, the patient was restarted on his home  medications of Ultram ER 300 mg daily; Frova 2.5 mg 1 at the onset of  headache and 1 in 2 hours if headache continues, not to exceed 2 in 24  hours; he was also restarted on his Proctosol-HC 2.5% cream b.i.d.;  Asacol 400 mg 1 in the morning and 1 at bedtime; gabapentin 800 mg p.o.  t.i.d., Soma 350 mg p.o. t.i.d.; Prevacid 30 mg daily; Colace 100 mg 2  tablets at night; Tramadol 50 mg q.8 h. p.r.n. pain; Vistaril 50 mg  b.i.d. p.r.n. anxiety; multivitamin 1 tablet p.o. daily; aspirin enteric-  coated 81 mg daily; melatonin 3 mg p.o. 2 tablets at bedtime; Metamucil  6 capsules daily; red yeast and fish  oils; trazodone 50 mg p.o. q.h.s.;  he was also restarted on Pristiq 50 mg daily and started on Effexor XR  37.5 mg daily; however, the Pristiq was later discontinued and he was  started on Effexor XR 75 mg p.o. q. a.m. and 37.5 mg p.o. q.3 p.m.  He  was also started on Vistaril 25 mg p.o. b.i.d. p.r.n. anxiety.  Initially in individual sessions, the patient was disheveled.  He had  absent eye contact.  He was lying in bed and refused to look at me.  There was positive psychomotor retardation.  His speech was normal rate  and flow.  He was very drowsy and lethargic.  He was oriented x4,  however.  Mood was depressed and anxious.  Affect was consistent with  mood.  He denied suicidal ideation.  At this point, he also denied  homicidal ideation.  There was no evidence of a thought disorder or  psychosis.  As hospitalization progressed, he continued to be depressed  and anxious.  He was very worried about where he will live.  He was  upset about not seeing his mother since she is being moved into an  assisted living.  He was quite anxious and his  Vistaril was increased to  25 mg p.o. q. a.m. and 50 mg p.o. q.h.s.  He continued be depressed and  anxious on April 17, 2009.  On the stay, he stated he would kill himself  if he left the hospital by jumping in front of car or jumping off a  bridge or overdosing.  He continued to feel hopeless.  He continued to  deny that he had been threatening or harm to his mother, which the  brother was claiming.  He was very upset and felt hopeless about his  situation.  On April 23, 2009, mood was improving somewhat.  He was less  anxious.  He still had a lot of concerns about where he was going to  live.  On April 25, 2009, mental status had improved.  The patient was  less depressed, less anxious.  Psychomotor activity was within normal  limits.  Speech was normal, rate and flow.  Eye contact was good.  He  was casually and neatly dressed.  He had no suicidal or homicidal  ideation.  No auditory or visual hallucinations.  No paranoia or  delusions.  Thoughts were logical and goal directed.  Thought content,  no predominant theme.  A taxi was arranged to take him to homeless  shelter in [place].  The patient called and spoke with the staff.  He had some concerns about keeping his pain medications secure at night  while in the shelter, but the staff assured him that his medications can  be locked up at night.  He was given directions to mental health center  in public health in [facility] for medication management.  The  patient was felt to be safe for discharge and was agreeable with this  transition to the homeless shelter.   DISCHARGE DIAGNOSES:  Axis I:.  Major depressive disorder, recurrent,  severe without psychosis.  Axis II:  Borderline personality disorder.  Axis III:  Migraine headache, gastroesophageal reflux disease.  Axis IV:  Severe (issues with housing and sibling conflict, burden of  psychiatric illness, and burden of medical problems).  Axis V:  Global assessment of  functioning was 50 upon discharge.  Global  assessment of functioning was 40 upon admission.  Global assessment of  functioning highest  past year was 13.   DISCHARGE PLAN:  There were no specific activity level or dietary  restrictions.   POST HOSPITAL CARE PLANS:  The patient will go to [facility] in [name] on April 27, 2009, at 8 a.m.   DISCHARGE MEDICATIONS:  1. Colace 100 mg at bedtime.  2. Prevacid 30 mg daily.  3. Gabapentin 800 mg t.i.d.  4. Aspirin 81 mg daily.  5. Continue Metamucil and melatonin as he was taking at home.  6. Multivitamins daily.  7. Pristiq 50 mg every a.m.  8. Effexor XR 37.5 mg at 3 p.m.  9. Asacol 400 mg 3 times a day.  10.Vistaril 50 mg at bedtime and 25 mg in the morning  11.Ultram 100 mg at bedtime and twice a day.  12.Frova 2.5 mg continue as prescribed.  13.Anusol cream continue as prescribed for hemorrhoids.  14.Continue Soma as prescribed.  15.Trazodone 50 mg at bedtime.      Jasmine Pang, M.D.  Electronically Signed     BHS/MEDQ  D:  05/02/2009  T:  05/03/2009  Job:  [id number]

## 2011-02-21 NOTE — Op Note (Signed)
[facility]  Patient:    Jeffrey [name], Jeffrey [name] Visit Number: [id number] MRN: [id number]          Service Type: NES Location: NESC Attending Physician:  Monica Becton Dictated by:   Claudette Laws, M.D. Proc. Date: 09/16/01 Admit Date:  09/16/2001                             Operative Report  PREOPERATIVE DIAGNOSIS:  1 cm distal right ureteral calculus with renal colic.  POSTOPERATIVE DIAGNOSIS:  1 cm distal right ureteral calculus with renal colic.  OPERATION PERFORMED:  1. Cystoscopy and right retrograde pyeloureterogram.  2. Rigid ureteroscopy and holmium laser lithotripsy of right ureteral     stone.  3. Insertion of a 6 French 26 cm double J stent.  SURGEON:  Claudette Laws, M.D.  HISTORY OF PRESENT ILLNESS:  This is a 50 year old man who presented in the emergency room over the weekend with an obstructing 1 cm distal right ureteral calculus visualized by CAT scan. I saw him the next day in the office. A KUB x-ray confirmed persistent stone. He was having some colic and desired removal of the stone. We discussed treatment options and I thought the best option would be ureteroscopy with holmium laser lithotripsy. I explained the procedure to him carefully, how we do including complications such as postop bleeding, rare instance of ureteral stricture, inadvertent perforation of the ureter and sometimes inability to break up the stone and the fact that he would need a double J stent postop for one to two days postop and that I would leave the string on the end and he could remove the stent himself in about two days anticipating that whatever residual gravel we generated would pass spontaneously. He understands and agrees to the proposed surgery.  DESCRIPTION OF PROCEDURE:  The patient was prepped and draped in the dorsal lithotomy position under LMA anesthesia. Cystoscopy was performed with a 22 French rigid cystoscopy. There is a normal  anterior urethra, small prostate, normal appearing bladder, no tumors, no calculi. There was some edema around the right ureteral orifice no doubt due to the stone.  Initially I passed up a 0.038 Glidewire through a 6 [place] open ended ureteral catheter using fluoroscopic control. We then obtained retrograde studies showing a moderate right hydroureteral nephrosis. The guidewire was then positioned in the renal pelvis. I backed out the open ended catheter and then along side the guidewire, I passed up a long 6.5 [place] rigid Wolf ureteroscope. This was passed up into the ureter without much difficulty. We engaged the stone and then using the 325 laser fiber, holmium laser lithotripsy was performed upon the calculus breaking it up into multiple fine particles. Appropriate pictures were taken.  I backed out the ureteroscope and then backloaded the guidewire through an open ended 6 French catheter through a cystoscope and then backed out the open ended catheter and over the guidewire passed a 6 French 26 cm double J ureteral stent with the string attached. This was positioned and at the end it appeared to be either at the right renal pelvis or possibly in the right proximal ureter but in any event I thought it was in satisfactory position for our purposes. The bladder was emptied. The string was then brought out the urethra and attached to the penis with tape. Then 20 cc of xylocaine jelly was passed up the urethra for urethral anesthesia and a  B&O suppository was placed per rectum for bladder spasms.  He was then taken back to the recovery room in satisfactory condition. Dictated by:   Claudette Laws, M.D. Attending Physician:  Monica Becton DD:  09/16/01 TD:  09/16/01 Job: [id number]

## 2011-02-21 NOTE — Op Note (Signed)
[place]  Patient:    Jeffrey [name]                     MRN: [id number] Proc. Date: 11/05/99 Adm. Date:  [id number] Disc. Date: [id number] Attending:  Jonne Ply                           Operative Report  PREOPERATIVE DIAGNOSIS:  C5-6 and C6-7 cervical spondylosis with herniated nucleus pulposus and cervical radiculopathy.  DISCHARGE DIAGNOSIS:  C5-6 and C6-7 cervical spondylosis with herniated nucleus  pulposus and cervical radiculopathy.  PROCEDURE:  Anterior cervical diskectomy and arthrodesis C5-6 and C6-7.  SURGEON:  Stefani Dama, M.D.  ASSISTANT:  Alanson Aly. Roxan Hockey, M.D.  ANESTHESIA:  General endotracheal.  INDICATIONS:   The patient is a 50 year old individual, who has had significant  neck, shoulder and arm pain with dysesthesias into the fingertips.  He has had significant trials of conservative management with physical therapy, having failed this, he was advised regarding a two level anterior diskectomy and arthrodesis.   PROCEDURE:  The patient is brought to the operating room, placed on table in supine position.  After smooth induction of general endotracheal anesthesia, he was placed in 5 pounds of halter traction.  The neck was shaved, prepped with Duraprep and  draped in a sterile fashion.  Transverse incision was created on the left side f the neck and carried down through the platysma and a plane between the sternocleidomastoid and the strap muscles was dissected bluntly until the prevertebral space was reached.  First identifiable disk space was noted to be hat of C5-6.  Diskectomy was then performed at C5-6 after removing ventral osteophytes. A self-retaining retractor was placed in the wound to facilitate retraction of he soft tissues and the esophagus.  Diskectomy was completed and as the posterior longitudinal ligament was reached on the left side, there was noted to be significant  subligamentous disk material herniated into the region of the foramen. This was resected, the foramen was opened by removing some small uncinate spurs  that were evident in the left lateral recess and also in the right lateral recess. The disk was decompressed well in these lateral recesses.  The common dural tube was decompressed centrally.  Once this decompression was obtained, the endplates were ground smooth.  A round 6 mm fibular graft was placed into the interspace. A similar procedure was then carried out at C6-7.  Diskectomy was performed and the space was decompressed.  The interspace was dissected free and again, subligamentous disk material was encountered more on the left side than on the right side.  Once this was decompressed, the area was checked for hemostasis and then a 6 mm round fibular graft was placed into the interspace.  Traction was removed and the neck was placed in slight flexion.  A 34 mm Synthes plate was placed over the vertebral bodies and contoured appropriately.  Six locking 4 x 14 mm screws were placed and locked appropriately.  The area was then copiously irrigated with antibiotic irrigating solution.  The cervical fascia was then closed while I closed the platysma with 2-0 Vicryl interrupted fashion and 3-0 Vicryl as used in the subcuticularly.  The patient tolerated the procedure well. DD:  11/05/99 TD:  11/05/99 Job: [id number]

## 2011-02-21 NOTE — H&P (Signed)
NAME:  VONTAE, COURT NO.:  [id number]   MEDICAL RECORD NO.:  [id number]          PATIENT TYPE:  EMS   LOCATION:  MAJO                         FACILITY:  [facility]   PHYSICIAN:  Hettie Holstein, D.O.    DATE OF BIRTH:  1961/04/04   DATE OF ADMISSION:  11/03/2005  DATE OF DISCHARGE:                                HISTORY & PHYSICAL   PRIMARY CARE PHYSICIAN:  Dr. Louanna Raw   CHIEF COMPLAINT:  Chest pain.   HISTORY OF PRESENT ILLNESS:  Mr. Saldivar is a 50 year old Caucasian male  who lives with his mother who has been in his usual state of health up until  around 8:30 p.m. this past evening where he developed some left parasternal  chest sharp stabbing pain while at rest that subsequently radiated to his  right arm down into his right hand and fourth and fifth digits.  He stated  that this was severe and lasted two to three hours.  Did experience some  relief with nitroglycerin.  He did not state he had any pleuritic component  and he expressed this to his mother who subsequently called 911.  In the  emergency department he was given nitroglycerin and subsequently had relief  with the nitroglycerin.  In the emergency department his EKG revealed normal  sinus rhythm.  He underwent CT scan.  There was no evidence of PE.  Mr.  Loper is quite anxious and concerned.  He is pain-free now.  He continues  to be quite anxious.  He is being admitted for 23-hour observation and  possibly if he does rule out has requested this be scheduled to follow up  with Dr. Patty Sermons for further risk stratification studies as this is his  mom's cardiologist.   PAST MEDICAL HISTORY:  1.  As noted above.  2.  Chronic neck problems status post anterior cervical diskectomy and      arthrodesis at C5-6 and C6-7 by Dr. Danielle Dess in 2001.  3.  History of cluster headaches.  4.  History of ureteral stones managed by Dr. Aldean Ast in the outpatient      setting.  5.  History of right leg injury  as a child, right ankle surgery in the      distant past.  6.  He has undergone no stress testing before.  7.  He has had a history of GI bleed related to nonsteroidal anti-      inflammatories.  He has been scoped by Dr. Dorena Cookey in 2000 which he      stated he had polyps at the time.   MEDICATIONS:  1.  Ultram ER 100 mg daily.  2.  Urocit K 1080 mg t.i.d.  3.  Gabapentin 100 mg t.i.d.  4.  Propecia 1 mg daily.  5.  Zomig 5 mg p.r.n. for cluster headaches.   ALLERGIES:  He has no known drug allergies but he does report some drug  intolerances including NSAIDs and TYLENOL that causes GI bleed and VALIUM he  states just causes him to feel unwell.   SOCIAL HISTORY:  Patient smoked tobacco  over 20 years ago; however, he  denies it at this point.  Denies alcohol.  He is single.  He lives with his  mom.  He is currently on disability due to work-related injury.   FAMILY HISTORY:  His mother is alive and well at age 64 with heart problems  and colon cancer.  His father died at age 66 with an MI.   REVIEW OF SYSTEMS:  Been in his usual state of health with the exception of  the above described in the HPI.  Has had no nausea, vomiting, or diarrhea.  No appetite change or weight loss.   PHYSICAL EXAMINATION:  VITAL SIGNS:  Blood pressure 117/74, heart rate 74,  temperature 96.9, O2 saturation 98%.  GENERAL:  Patient is alert, in no acute distress.  Oriented x3.  NECK:  Supple, nontender.  No palpable thyromegaly or mass.  CARDIOVASCULAR:  Normal S1, S2 without S3, S4, appreciable murmur.  LUNGS:  Clear.  Exhibited normal effort with no dullness to percussion.  ABDOMEN:  Mildly obese.  There was no tenderness or rebound.  Bowel sounds  were normoactive.  EXTREMITIES:  Lower extremities revealed no evidence of edema.  NEUROLOGIC:  Euthymic.  He was a bit anxious; however, there was no focal  neurologic deficit.   LABORATORY DATA:  His point of care markers were unremarkable.  His  EKG  revealed normal sinus rhythm.  Urine drug screen revealed only positive  opiates.  Urinalysis was negative.  Sodium 139, potassium 3.7, BUN 10,  creatinine 1.1, glucose 91.  WBC 7.1, hemoglobin 16.2, platelet count 186,  MCV 88.  INR 0.9.  CT of chest as noted above.  Revealed no evidence of PE.  There was small bilateral upper lobe nodules with radiologist's  recommendations for follow-up CT scan in three months to reassess.   ASSESSMENT:  1.  Chest pain.  2.  Chronic neck pain.  3.  Abnormal chest CT.   PLAN:  Will cycle markers.  Place Mr. Rease on 23-hour observation.  Check his fasting lipid panel and potentially set up outpatient further  cardiac risk stratification if he rules out for acute ischemia and we will  recommend follow-up CT scan in three months.      Hettie Holstein, D.O.  Electronically Signed     ESS/MEDQ  D:  11/04/2005  T:  11/04/2005  Job:  [id number]   cc:   Louanna Raw  Fax: [phone]   Cassell Clement, M.D.  Fax: [phone]

## 2011-02-21 NOTE — Discharge Summary (Signed)
NAME:  Jeffrey [name], Jeffrey [name]             ACCOUNT NO.:  [id number]   MEDICAL RECORD NO.:  [id number]          PATIENT TYPE:  IPS   LOCATION:  0506                          FACILITY:  [facility]   PHYSICIAN:  Geoffery Lyons, M.D.      DATE OF BIRTH:  [birth year]   DATE OF ADMISSION:  06/23/2008  DATE OF DISCHARGE:  07/07/2008                               DISCHARGE SUMMARY   CHIEF COMPLAINT/PRESENT ILLNESS:  This was the second admission for Mr.  Bursch to [facility], who was admitted due to  increased depression.  Endorsed he could not sleep, eat, concentrate.  Appeared very anxious and nervous upon initial assessment.  He denied  any suicidal thoughts, but did acknowledge feeling homicidal towards his  brother.  The depression has been getting worse for 3 weeks.   PAST PSYCHIATRIC HISTORY:  He was at [facility] January 06, 2008 to January 17, 2008 and he was in [facility] back in  October 2008.  He was treated with Prozac and Celexa.  These medications  made him feel that abuse.  He was discharged from [facility] on  Effexor.   Alcohol:  No history.  Denies history of any substance abuse.   MEDICAL HISTORY:  Migraine headache, chronic pain, status post injury 9  years ago at work.  He had a C6-C7 fusion.   MEDICATIONS:  1. He was taking Ultram ER 300 mg daily.  2. Tramadol 50 mg 3 times as needed.  3. Carisoprodol 350 mg 3 times a day.  4. Neurontin 300 mg 3 times a day.  5. Proctosol 2.5% using it as needed.  6. Prevacid 30 mg per day.  7. Asacort 400 mg 3 twice a day.  8. Effexor XR 112.5 mg per day.  9. Crestor 10 mg per day.  10.Also uses Frova 2.5, one tablet at the onset of headache,  1 in 2      hours if needed.   Physical showed to fail show any acute findings.   LABORATORY WORKUP:  Results are not available in the chart.   Remainder of  exam reveals an alert, cooperative male appropriately  groomed, dressed and nourished.  Speech  was normal in rate, tempo and  production.  Mood was irritable, anxious and though he had been having a  migraine headache earlier, he was finally getting some relief from the  Frova.  Thought processes were clear, rational and goal oriented.  He  was requesting that his medications were adjusted.  No active delusions.  No suicidal ideas, but did endorsed thoughts of hurting the brother, who  he claims is a substance abuser.  Also endorsed a lot of stress taking  care of his mother that she can he claims has dementia.  No delusions.  No hallucinations.  Cognition well-preserved.   AXIS I: Major depressive disorder.  AXIS II: No diagnosis.  AXIS III:  C6-C7 fusion, migraine headaches, chronic pain.  AXIS IV: Moderate.  AXIS:  On admission 35, GAF in the last year 60.   COURSE IN THE HOSPITAL:  He  was admitted and started on individual and  group psychotherapy.  As already stated, a 50 year old male who endorsed  that the depression has been getting worse.  Endorsed that the anxiety  was better, but his mood continued to go down at home, mostly in bed.  He said that he is dealing with his 77 year old mother who has dementia.  Also endorsed that his brother is creating a lot of stress for them.  He  is also dealing with pain in his neck, foot, as well as acute migraine  headaches.  Endorsed that he could not continued to feel like this.  A  lot of ruminations.  Effexor was decreased from 150-112.5 and then to  25.  He was trying to switch antidepressants.  He did endorse that the  Effexor dosages were causing urinary retention, so it was decreased to  possibly changing to Pristiq.  This was with his outpatient Martice Doty  plan and we went ahead and pursued this plan.  We decreased the Effexor  to 37.5, then tried Pristiq.  We used melatonin for sleep.  There was  some restlessness as he switched from Effexor to Pristiq, as well as  some confusion.  There was also increased sweating.  We  continued to  adjust the medications.  Gradually, the side effects got better.  Upon  discharge, he was going to stay with his mom.  Eventually, he wanted to  get a place for himself.  In the next 48 hours, he continued to improve.  No side effects to the Pristiq, endorsed improvement, so we went ahead  and discharged to outpatient follow-up.   AXIS I:  Anxiety disorder not otherwise specified.  AXIS II:  No diagnosis.  AXIS III:  Migraine headache.  C6-C7 fusion, chronic pain.  AXIS IV: Moderate.  AXIS V:  Upon discharge 55-60.   Discharged on:  1. Ultram ER 300 mg in the morning.  2. Prevacid 30 mg per day, Asacol 1200 mg twice a day.  3. Neurontin 900 mg twice a day.  4. Crestor 10 mg per day.  5. Pristiq 50 mg per day.  6. Ultram 50 mg 3 times a day.  7. Soma 350 mg 3 times a day.  8. Colace 100 mg twice a day.  9. Melatonin 3 mg two at night.  10.Frova 2.5 one as needed for headache.  11.Ambien 10 mg as needed for sleep.      Geoffery Lyons, M.D.  Electronically Signed     IL/MEDQ  D:  08/02/2008  T:  08/02/2008  Job:  [id number]   cc:   [facility]

## 2011-02-21 NOTE — Op Note (Signed)
NAME:  Jeffrey [name], Jeffrey [name]             ACCOUNT NO.:  [id number]   MEDICAL RECORD NO.:  [id number]          PATIENT TYPE:  AMB   LOCATION:  ENDO                         FACILITY:  [facility]   PHYSICIAN:  John C. Madilyn Fireman, M.D.    DATE OF BIRTH:  1960-10-20   DATE OF PROCEDURE:  07/13/2006  DATE OF DISCHARGE:                                 OPERATIVE REPORT   INDICATIONS FOR PROCEDURE:  Rectal bleeding, family history of colon cancer  in 2 second-degree relatives.   PROCEDURE:  The patient was placed in the left lateral decubitus position  and placed on the pulse monitor with continuous low-flow oxygen delivered by  nasal cannula.  He was sedated with 50 mcg IV fentanyl and 2 mg IV Versed.  The Olympus video colonoscope was inserted into the rectum and advanced to  cecum, confirmed by transillumination of McBurney's point and visualization  of ileocecal valve and appendiceal orifice.  Prep was excellent.  The cecum,  ascending, transverse and descending colon all appeared normal with no  masses, polyps, diverticula or other mucosal abnormalities.  There were a  few scattered sigmoid diverticula.  In the sigmoid colon and proximal  rectum, there were patches of erythema, granularity and small punctate  erosions with exudate in a patchy distribution.  It was consistent with a  mild focal colitis.  The distal rectum seemed relatively spared.  Retroflexed view of the anus did reveal some small internal hemorrhoids.  Biopsies were taken of the apparently inflamed areas.  The scope was then  withdrawn, and the patient returned to the recovery room in stable  condition.  He tolerated the procedure well.  There were no immediate  complications.   IMPRESSION:  1. Apparent mild colitis of the sigmoid and rectum.  2. Scattered diverticulosis.  3. Small internal hemorrhoids.   PLAN:  Await biopsy results to confirm or exclude colitis.           ______________________________  Everardo All. Madilyn Fireman,  M.D.     JCH/MEDQ  D:  07/13/2006  T:  07/14/2006  Job:  [id number]
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
## 2011-02-21 NOTE — Consult Note (Signed)
[facility]  Patient:    Jeffrey [name], Jeffrey [name] Visit Number: [id number] MRN: [id number]          Service Type: PMG Location: TPC Attending Physician:  Sondra Come Dictated by:   Jewel Baize Stevphen Rochester, M.D. Admit Date:  07/07/2001   CC:         Wayne C. Dorna Bloom, M.D.   Consultation Report  HISTORY OF PRESENT ILLNESS:  Jeffrey [name] comes to the center of pain management today.  I reviewed health and history form and 14-point review of systems.  This is an extensive consultation in excess of 30 minutes face-to-face.  1. The patient comes in today head-on prescription, claiming that he has    discussed with the nurses and was told it was okay.  In fact, extensive    documentation refutes this.  In a compassionate care arena,    nonconfrontational, but directed at his best interest and with safety    considerations foremost, we informed him that self-directed care will not    be tolerated. 2. We also discussed issues that might be related to some variant    pseudoaddiction.  I do not think that he is really an individual that I    would want to escalate too far on his narcotics, but I am going to go ahead    and give him the benefit of the doubt at this time and I do think a    pharmacokinetically long-acting drug will help [place] here.  In fact, I do    think he needs to return to the work arena, and at most a month or two of    narcotic-based pain medication should be considered, and I will trial    Kadian and full disclosure of this medication is given, he understands the    implications of misusing this medication, abusing this medication, and    elements of patient care agreement. 3. His case manager is introduced to the patient after the patient is    discussed after medication issues have been discussed with the patient.  At    no time did I violate appropriate statutes or patient confidentiality as    the patient requests, as it relates to his Workmans  Compensation case    issues.  Objectively, he has no significant change in neurologic or musculoskeletal presentation.  He has diffuse suprascapular paracervical myofascial discomfort, impaired flexion-extension in the cervical spine.  Positive cervical facetal compression test but he is distractable.  No new neurological features.  Motor/sensory reflexes.  IMPRESSION:  Cervicalgia and myofascial pain syndrome.  PLAN: 1. I think it reasonable to perform an interventional procedure at this point,    as really we need to move this case forward.  I do not think he is a    surgical candidate and I believe either a facetal injection followed by RF    or possibly even something as straight forward as a cervical epidural would    be helpful here.  I will review his MRI. 2. Kadian will be prescribed, at least for the next month to month and a half    with full intent to move to non-narcotic medication alternatives. 3. Biomechanical assessment, consider muscle stimulator. 4. I do not believe this patient is a disability candidate.  In fact, quite    the opposite.  He needs to be enabled.  He has quite a bit of anger and    hostility and he needs to control this, and  this is really not a Pharmacist, hospital.  It is not even a pain management issue, this    individual is demonstrating elements of personality that I would wonder if    were in a borderline category.  I again emphasized that I would like him to    see a psychologist or psychiatrist to help with anger and stress    management, and he declines.  We will see him in follow-up.  Consider interventional procedure and any further injections based on need and responsiveness.  His behavior is more appropriate at discharge, but be mindful for explosive bursts of anger, and management in a proactive role, emphasizing compliance and health-related issues.  Patient care agreement reviewed.  Extensive consultation with  nurses sitting discussing elements of their documentation with the patient, and this is a chaperoned interview. Dictated by:   Jewel Baize Stevphen Rochester, M.D. Attending Physician:  Sondra Come DD:  08/03/01 TD:  08/04/01 Job: [id number]

## 2011-06-30 LAB — URINALYSIS, ROUTINE W REFLEX MICROSCOPIC
Glucose, UA: NEGATIVE
Nitrite: NEGATIVE
Protein, ur: NEGATIVE
Urobilinogen, UA: 0.2

## 2011-06-30 LAB — CBC
HCT: 47.6
MCHC: 34.8
MCV: 88.7
Platelets: 186
RDW: 13.6
WBC: 7.2

## 2011-06-30 LAB — BASIC METABOLIC PANEL
BUN: 12
Chloride: 105
Creatinine, Ser: 1.18
Glucose, Bld: 99
Potassium: 3.9

## 2011-06-30 LAB — DIFFERENTIAL
Basophils Absolute: 0
Basophils Relative: 0
Eosinophils Absolute: 0.1
Eosinophils Relative: 1
Lymphs Abs: 3.1
Neutrophils Relative %: 48

## 2011-07-01 LAB — DIFFERENTIAL
Basophils Absolute: 0
Basophils Relative: 1
Lymphocytes Relative: 37
Monocytes Relative: 7
Neutro Abs: 4.5
Neutrophils Relative %: 56

## 2011-07-01 LAB — POCT CARDIAC MARKERS
CKMB, poc: <1 — ABNORMAL LOW
Myoglobin, poc: 46.6
Operator id: 257131

## 2011-07-01 LAB — URINALYSIS, ROUTINE W REFLEX MICROSCOPIC
Glucose, UA: NEGATIVE
Nitrite: NEGATIVE
Specific Gravity, Urine: 1.027
pH: 7

## 2011-07-01 LAB — POCT I-STAT, CHEM 8
BUN: 15
Chloride: 105
HCT: 54 — ABNORMAL HIGH
Potassium: 4

## 2011-07-01 LAB — HEPATIC FUNCTION PANEL
ALT: 31
Bilirubin, Direct: 0.1
Indirect Bilirubin: 0.6
Total Protein: 7

## 2011-07-01 LAB — RAPID URINE DRUG SCREEN, HOSP PERFORMED: Tetrahydrocannabinol: NOT DETECTED

## 2011-07-01 LAB — ETHANOL: Alcohol, Ethyl (B): <5

## 2011-07-01 LAB — CBC
Hemoglobin: 17
MCHC: 33.5
RBC: 5.61

## 2011-07-03 LAB — BASIC METABOLIC PANEL
Chloride: 104
Creatinine, Ser: 1.21
GFR calc Af Amer: >60
Potassium: 4.2
Sodium: 137

## 2011-07-03 LAB — POCT I-STAT, CHEM 8
Creatinine, Ser: 1.1
Glucose, Bld: 119 — ABNORMAL HIGH
Hemoglobin: 18 — ABNORMAL HIGH
TCO2: 20

## 2011-07-03 LAB — CBC
HCT: 50.5
Hemoglobin: 17.1 — ABNORMAL HIGH
MCV: 89.3
RBC: 5.66
WBC: 13.9 — ABNORMAL HIGH

## 2011-07-03 LAB — URINALYSIS, ROUTINE W REFLEX MICROSCOPIC
Bilirubin Urine: NEGATIVE
Hgb urine dipstick: NEGATIVE
Ketones, ur: 15 — AB
Nitrite: NEGATIVE
pH: 8.5 — ABNORMAL HIGH

## 2011-07-03 LAB — RAPID URINE DRUG SCREEN, HOSP PERFORMED
Cocaine: POSITIVE — AB
Opiates: NOT DETECTED
Tetrahydrocannabinol: NOT DETECTED

## 2011-07-03 LAB — POCT CARDIAC MARKERS
CKMB, poc: 1.3
CKMB, poc: 1.6
Troponin i, poc: <0.05

## 2011-07-03 LAB — D-DIMER, QUANTITATIVE: D-Dimer, Quant: 0.24

## 2011-07-03 LAB — PROTIME-INR: INR: 0.9

## 2011-07-07 LAB — RAPID URINE DRUG SCREEN, HOSP PERFORMED
Amphetamines: NOT DETECTED
Benzodiazepines: NOT DETECTED
Cocaine: NOT DETECTED
Cocaine: NOT DETECTED
Opiates: NOT DETECTED
Opiates: NOT DETECTED
Tetrahydrocannabinol: NOT DETECTED
Tetrahydrocannabinol: NOT DETECTED

## 2011-07-07 LAB — BASIC METABOLIC PANEL
CO2: 28
Calcium: 9.3
GFR calc Af Amer: >60
GFR calc non Af Amer: 59 — ABNORMAL LOW
Sodium: 140

## 2011-07-07 LAB — DIFFERENTIAL
Lymphocytes Relative: 27
Lymphs Abs: 2.6
Monocytes Absolute: 0.7
Monocytes Relative: 7
Neutro Abs: 6
Neutrophils Relative %: 64

## 2011-07-07 LAB — CBC
Hemoglobin: 17.6 — ABNORMAL HIGH
RBC: 5.74
WBC: 9.4

## 2011-07-07 LAB — POCT I-STAT, CHEM 8
BUN: 11
Calcium, Ion: 1.26
Chloride: 110
Glucose, Bld: 91
TCO2: 22

## 2011-07-07 LAB — URINALYSIS, ROUTINE W REFLEX MICROSCOPIC
Hgb urine dipstick: NEGATIVE
Ketones, ur: NEGATIVE
Nitrite: NEGATIVE
Urobilinogen, UA: 0.2

## 2011-07-07 LAB — POCT CARDIAC MARKERS: Troponin i, poc: <0.05

## 2011-07-07 LAB — ETHANOL
Alcohol, Ethyl (B): <5
Alcohol, Ethyl (B): <5

## 2011-07-07 LAB — TRICYCLICS SCREEN, URINE: TCA Scrn: NOT DETECTED

## 2011-07-17 LAB — RAPID URINE DRUG SCREEN, HOSP PERFORMED
Amphetamines: NOT DETECTED
Barbiturates: NOT DETECTED
Benzodiazepines: NOT DETECTED
Cocaine: NOT DETECTED
Opiates: NOT DETECTED
Tetrahydrocannabinol: NOT DETECTED

## 2011-07-17 LAB — POCT CARDIAC MARKERS
CKMB, poc: <1 — ABNORMAL LOW
Myoglobin, poc: 85.5
Operator id: 277751
Troponin i, poc: <0.05

## 2013-03-29 ENCOUNTER — Encounter (HOSPITAL_COMMUNITY): Payer: Self-pay

## 2013-03-29 ENCOUNTER — Emergency Department (HOSPITAL_COMMUNITY)
Admission: EM | Admit: 2013-03-29 | Discharge: 2013-03-30 | Disposition: A | Discharge status: Other Acute Inpatient Hospital | Payer: Medicaid Other | Attending: Emergency Medicine | Admitting: Emergency Medicine

## 2013-03-29 DIAGNOSIS — M542 Cervicalgia: Secondary | ICD-10-CM | POA: Insufficient documentation

## 2013-03-29 DIAGNOSIS — F319 Bipolar disorder, unspecified: Secondary | ICD-10-CM | POA: Insufficient documentation

## 2013-03-29 DIAGNOSIS — F332 Major depressive disorder, recurrent severe without psychotic features: Secondary | ICD-10-CM

## 2013-03-29 DIAGNOSIS — R45851 Suicidal ideations: Secondary | ICD-10-CM | POA: Insufficient documentation

## 2013-03-29 DIAGNOSIS — Z79899 Other long term (current) drug therapy: Secondary | ICD-10-CM | POA: Insufficient documentation

## 2013-03-29 DIAGNOSIS — F322 Major depressive disorder, single episode, severe without psychotic features: Secondary | ICD-10-CM | POA: Diagnosis present

## 2013-03-29 DIAGNOSIS — R413 Other amnesia: Secondary | ICD-10-CM | POA: Insufficient documentation

## 2013-03-29 DIAGNOSIS — K219 Gastro-esophageal reflux disease without esophagitis: Secondary | ICD-10-CM | POA: Insufficient documentation

## 2013-03-29 DIAGNOSIS — G4700 Insomnia, unspecified: Secondary | ICD-10-CM | POA: Insufficient documentation

## 2013-03-29 DIAGNOSIS — F3289 Other specified depressive episodes: Secondary | ICD-10-CM | POA: Insufficient documentation

## 2013-03-29 DIAGNOSIS — F411 Generalized anxiety disorder: Secondary | ICD-10-CM | POA: Insufficient documentation

## 2013-03-29 DIAGNOSIS — F329 Major depressive disorder, single episode, unspecified: Secondary | ICD-10-CM | POA: Insufficient documentation

## 2013-03-29 HISTORY — DX: Other chronic pain: G89.29

## 2013-03-29 HISTORY — DX: Anxiety disorder, unspecified: F41.9

## 2013-03-29 HISTORY — DX: Major depressive disorder, single episode, unspecified: F32.9

## 2013-03-29 HISTORY — DX: Depression, unspecified: F32.A

## 2013-03-29 HISTORY — DX: Poisoning by unspecified drugs, medicaments and biological substances, intentional self-harm, initial encounter: T50.902A

## 2013-03-29 LAB — COMPREHENSIVE METABOLIC PANEL
ALT: 28 U/L (ref 0–53)
Alkaline Phosphatase: 68 U/L (ref 39–117)
BUN: 10 mg/dL (ref 6–23)
CO2: 26 mEq/L (ref 19–32)
Calcium: 9.4 mg/dL (ref 8.4–10.5)
GFR calc Af Amer: >90 mL/min (ref >90)
GFR calc non Af Amer: >90 mL/min (ref >90)
Glucose, Bld: 89 mg/dL (ref 70–99)
Sodium: 139 mEq/L (ref 135–145)
Total Protein: 7.4 g/dL (ref 6.0–8.3)

## 2013-03-29 LAB — CBC
HCT: 44.5 % (ref 39.0–52.0)
Hemoglobin: 15.4 g/dL (ref 13.0–17.0)
MCH: 30.4 pg (ref 26.0–34.0)
MCHC: 34.6 g/dL (ref 30.0–36.0)
RBC: 5.06 MIL/uL (ref 4.22–5.81)

## 2013-03-29 LAB — RAPID URINE DRUG SCREEN, HOSP PERFORMED
Barbiturates: NOT DETECTED
Opiates: NOT DETECTED
Tetrahydrocannabinol: NOT DETECTED

## 2013-03-29 LAB — ETHANOL: Alcohol, Ethyl (B): <11 mg/dL (ref 0–11)

## 2013-03-29 MED ORDER — HYDROXYZINE HCL 25 MG PO TABS
50.0000 mg | ORAL_TABLET | Freq: Every day | ORAL | Status: DC
  Administered 2013-03-29: 50 mg via ORAL
  Filled 2013-03-29 (×2): qty 1

## 2013-03-29 MED ORDER — SIMVASTATIN 10 MG PO TABS
10.0000 mg | ORAL_TABLET | Freq: Every day | ORAL | Status: DC
  Administered 2013-03-29: 10 mg via ORAL
  Filled 2013-03-29 (×2): qty 1

## 2013-03-29 MED ORDER — IBUPROFEN 600 MG PO TABS: 600.0000 mg | ORAL_TABLET | Freq: Three times a day (TID) | ORAL | Status: DC | PRN

## 2013-03-29 MED ORDER — TRAMADOL HCL 50 MG PO TABS
50.0000 mg | ORAL_TABLET | Freq: Every day | ORAL | Status: DC
  Administered 2013-03-29: 50 mg via ORAL
  Filled 2013-03-29: qty 1

## 2013-03-29 MED ORDER — TRAZODONE HCL 50 MG PO TABS
150.0000 mg | ORAL_TABLET | Freq: Every day | ORAL | Status: DC
  Administered 2013-03-29: 150 mg via ORAL
  Filled 2013-03-29: qty 1

## 2013-03-29 MED ORDER — ONDANSETRON HCL 4 MG PO TABS: 4.0000 mg | ORAL_TABLET | Freq: Three times a day (TID) | ORAL | Status: DC | PRN

## 2013-03-29 MED ORDER — SUMATRIPTAN SUCCINATE 50 MG PO TABS
50.0000 mg | ORAL_TABLET | ORAL | Status: DC | PRN
  Filled 2013-03-29: qty 1

## 2013-03-29 MED ORDER — LIDOCAINE 5 % EX PTCH
1.0000 | MEDICATED_PATCH | CUTANEOUS | Status: DC
  Filled 2013-03-29 (×2): qty 1

## 2013-03-29 MED ORDER — LIDOCAINE 5 % EX PTCH
1.0000 | MEDICATED_PATCH | CUTANEOUS | Status: DC
  Administered 2013-03-30: 1 via TRANSDERMAL
  Filled 2013-03-29: qty 1

## 2013-03-29 MED ORDER — NICOTINE 21 MG/24HR TD PT24
21.0000 mg | MEDICATED_PATCH | Freq: Every day | TRANSDERMAL | Status: DC
  Filled 2013-03-29: qty 1

## 2013-03-29 MED ORDER — VENLAFAXINE HCL ER 150 MG PO CP24
150.0000 mg | ORAL_CAPSULE | Freq: Every day | ORAL | Status: DC
  Administered 2013-03-30: 150 mg via ORAL
  Filled 2013-03-29: qty 1

## 2013-03-29 MED ORDER — ACETAMINOPHEN 325 MG PO TABS: 650.0000 mg | ORAL_TABLET | ORAL | Status: DC | PRN

## 2013-03-29 MED ORDER — PROPRANOLOL HCL 10 MG PO TABS
10.0000 mg | ORAL_TABLET | Freq: Every day | ORAL | Status: DC
  Administered 2013-03-30: 10 mg via ORAL
  Filled 2013-03-29 (×3): qty 1

## 2013-03-29 MED ORDER — GABAPENTIN 400 MG PO CAPS
800.0000 mg | ORAL_CAPSULE | Freq: Three times a day (TID) | ORAL | Status: DC
  Administered 2013-03-29 – 2013-03-30 (×2): 800 mg via ORAL
  Filled 2013-03-29 (×5): qty 2

## 2013-03-29 MED ORDER — ALUM & MAG HYDROXIDE-SIMETH 200-200-20 MG/5ML PO SUSP: 30.0000 mL | ORAL | Status: DC | PRN

## 2013-03-29 MED ORDER — LORAZEPAM 1 MG PO TABS: 1.0000 mg | ORAL_TABLET | Freq: Three times a day (TID) | ORAL | Status: DC | PRN

## 2013-03-29 MED ORDER — HYDROXYZINE HCL 25 MG PO TABS
25.0000 mg | ORAL_TABLET | Freq: Every day | ORAL | Status: DC
  Administered 2013-03-30: 25 mg via ORAL
  Filled 2013-03-29: qty 1

## 2013-03-29 MED ORDER — CYCLOBENZAPRINE HCL 10 MG PO TABS
5.0000 mg | ORAL_TABLET | Freq: Three times a day (TID) | ORAL | Status: DC | PRN
  Administered 2013-03-29: 5 mg via ORAL
  Filled 2013-03-29: qty 1

## 2013-03-29 MED ORDER — PANTOPRAZOLE SODIUM 40 MG PO TBEC
80.0000 mg | DELAYED_RELEASE_TABLET | Freq: Every day | ORAL | Status: DC
  Administered 2013-03-30: 80 mg via ORAL
  Filled 2013-03-29: qty 2

## 2013-03-29 MED ORDER — ZOLPIDEM TARTRATE 5 MG PO TABS: 5.0000 mg | ORAL_TABLET | Freq: Every evening | ORAL | Status: DC | PRN

## 2013-03-29 MED ORDER — DIPHENHYDRAMINE HCL 25 MG PO TABS
25.0000 mg | ORAL_TABLET | Freq: Every day | ORAL | Status: DC
  Filled 2013-03-29: qty 1

## 2013-03-29 MED ORDER — HYDROXYZINE PAMOATE 25 MG PO CAPS: 25.0000 mg | ORAL_CAPSULE | Freq: Two times a day (BID) | ORAL | Status: DC

## 2013-03-29 MED ORDER — GABAPENTIN 800 MG PO TABS: 800.0000 mg | ORAL_TABLET | Freq: Three times a day (TID) | ORAL | Status: DC

## 2013-03-29 MED ORDER — DIPHENHYDRAMINE HCL 25 MG PO CAPS
25.0000 mg | ORAL_CAPSULE | Freq: Every day | ORAL | Status: DC
  Administered 2013-03-29: 25 mg via ORAL
  Filled 2013-03-29: qty 1

## 2013-03-29 MED ORDER — LORATADINE 10 MG PO TABS
10.0000 mg | ORAL_TABLET | Freq: Every day | ORAL | Status: DC
  Administered 2013-03-29 – 2013-03-30 (×2): 10 mg via ORAL
  Filled 2013-03-29 (×2): qty 1
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
## 2013-03-29 NOTE — Consult Note (Signed)
Reason for Consult:Eval for IP psychiatric Eval Referring Physician: [facility] ED[facility]  Jeffrey [name] is an 52 y.o. male.  HPI: Pt is a 52 y/o male with long standing hx of mood d/o, last IP admission at [facility] 08/2011, presenting voluntarily with SI ": thoughts of taking a drug O/D" and HI towards a person with whom he resides with. Pt does endorse some substance abuse lately i.e. crack cocaine. Pt denies any AVH or paranoia but cannot contract for safety at this time.  Past Medical History  Diagnosis Date  . Chronic pain   . Depression   . Anxiety   . Suicidal overdose     Past Surgical History  Procedure Laterality Date  . Ankle surgery    . Hernia repair    . Neck fusion      No family history on file.  Social History:  reports that he has been smoking.  He does not have any smokeless tobacco history on file. He reports that he does not drink alcohol or use illicit drugs.  Allergies:  Allergies  Allergen Reactions  . Abilify (Aripiprazole) Swelling  . Ativan (Lorazepam)     Extreme nervousness  . Celexa (Citalopram)     Nervousness  . Effexor (Venlafaxine)     Trouble urinating  . Levaquin (Levofloxacin) Diarrhea  . Lipitor (Atorvastatin)     Muscle weakness  . Prozac (Fluoxetine)     Difficulty walking  . Toradol (Ketorolac Tromethamine) Nausea And Vomiting  . Tylenol (Acetaminophen)     GI Bleeds, tolerates ibuprofen  . Valium (Diazepam)     Nervousness    Medications: I have reviewed the patient's current medications.  Results for orders placed during the hospital encounter of 03/29/13 (from the past 48 hour(s))  ACETAMINOPHEN LEVEL     Status: None   Collection Time    03/29/13  5:20 PM      Result Value Range   Acetaminophen (Tylenol), Serum <15.0  10 - 30 ug/mL   Comment:            THERAPEUTIC CONCENTRATIONS VARY     SIGNIFICANTLY. A RANGE OF 10-30     ug/mL MAY BE AN EFFECTIVE     CONCENTRATION FOR MANY PATIENTS.     HOWEVER, SOME ARE BEST  TREATED     AT CONCENTRATIONS OUTSIDE THIS     RANGE.     ACETAMINOPHEN CONCENTRATIONS     >150 ug/mL AT 4 HOURS AFTER     INGESTION AND >50 ug/mL AT 12     HOURS AFTER INGESTION ARE     OFTEN ASSOCIATED WITH TOXIC     REACTIONS.  CBC     Status: None   Collection Time    03/29/13  5:20 PM      Result Value Range   WBC 6.2  4.0 - 10.5 K/uL   RBC 5.06  4.22 - 5.81 MIL/uL   Hemoglobin 15.4  13.0 - 17.0 g/dL   HCT 16.1  09.6 - 04.5 %   MCV 87.9  78.0 - 100.0 fL   MCH 30.4  26.0 - 34.0 pg   MCHC 34.6  30.0 - 36.0 g/dL   RDW 40.9  81.1 - 91.4 %   Platelets 193  150 - 400 K/uL  COMPREHENSIVE METABOLIC PANEL     Status: None   Collection Time    03/29/13  5:20 PM      Result Value Range   Sodium 139  135 - 145  mEq/L   Potassium 3.8  3.5 - 5.1 mEq/L   Chloride 104  96 - 112 mEq/L   CO2 26  19 - 32 mEq/L   Glucose, Bld 89  70 - 99 mg/dL   BUN 10  6 - 23 mg/dL   Creatinine, Ser 1.61  0.50 - 1.35 mg/dL   Calcium 9.4  8.4 - 09.6 mg/dL   Total Protein 7.4  6.0 - 8.3 g/dL   Albumin 3.7  3.5 - 5.2 g/dL   AST 22  0 - 37 U/L   ALT 28  0 - 53 U/L   Alkaline Phosphatase 68  39 - 117 U/L   Total Bilirubin 0.3  0.3 - 1.2 mg/dL   GFR calc non Af Amer >90  >90 mL/min   GFR calc Af Amer >90  >90 mL/min   Comment:            The eGFR has been calculated     using the CKD EPI equation.     This calculation has not been     validated in all clinical     situations.     eGFR's persistently     <90 mL/min signify     possible Chronic Kidney Disease.  ETHANOL     Status: None   Collection Time    03/29/13  5:20 PM      Result Value Range   Alcohol, Ethyl (B) <11  0 - 11 mg/dL   Comment:            LOWEST DETECTABLE LIMIT FOR     SERUM ALCOHOL IS 11 mg/dL     FOR MEDICAL PURPOSES ONLY  SALICYLATE LEVEL     Status: Abnormal   Collection Time    03/29/13  5:20 PM      Result Value Range   Salicylate Lvl <2.0 (*) 2.8 - 20.0 mg/dL  URINE RAPID DRUG SCREEN (HOSP PERFORMED)     Status:  None   Collection Time    03/29/13  6:05 PM      Result Value Range   Opiates NONE DETECTED  NONE DETECTED   Cocaine NONE DETECTED  NONE DETECTED   Benzodiazepines NONE DETECTED  NONE DETECTED   Amphetamines NONE DETECTED  NONE DETECTED   Tetrahydrocannabinol NONE DETECTED  NONE DETECTED   Barbiturates NONE DETECTED  NONE DETECTED   Comment:            DRUG SCREEN FOR MEDICAL PURPOSES     ONLY.  IF CONFIRMATION IS NEEDED     FOR ANY PURPOSE, NOTIFY LAB     WITHIN 5 DAYS.                LOWEST DETECTABLE LIMITS     FOR URINE DRUG SCREEN     Drug Class       Cutoff (ng/mL)     Amphetamine      1000     Barbiturate      200     Benzodiazepine   200     Tricyclics       300     Opiates          300     Cocaine          300     THC              50    No results found.  Review of Systems  Constitutional: Negative.   HENT: Negative.  Eyes: Negative.   Respiratory: Negative.   Cardiovascular: Negative.   Gastrointestinal: Negative.   Genitourinary: Negative.   Musculoskeletal: Negative.   Skin: Negative.   Neurological: Negative for weakness.  Endo/Heme/Allergies: Negative.   Psychiatric/Behavioral: Positive for depression, suicidal ideas and memory loss. Negative for hallucinations and substance abuse. The patient is nervous/anxious and has insomnia.        Patient endorsing HI and SI cannot contract for safety   Blood pressure 133/83, pulse 90, temperature 98.4 F (36.9 C), temperature source Oral, resp. rate 20, SpO2 94.00%. Physical Exam  Nursing note and vitals reviewed. Constitutional: He is oriented to person, place, and time. He appears well-developed and well-nourished.  HENT:  Head: Normocephalic.  Eyes: Pupils are equal, round, and reactive to light.  Neck: No thyromegaly present.  Cardiovascular: Normal rate.   Respiratory: Breath sounds normal.  GI: Soft. Bowel sounds are normal.  Genitourinary:  Exam deferred  Neurological: He is alert and oriented to  person, place, and time.  Skin: Skin is warm and dry.  Psychiatric:  Endorses SI may take O/D of pills, also HI towards someone living in the home where he resides    Assessment/Plan: 1) recommend placement at [facility] 500 hall pending bed for safety, crises mgmt and stabilization 2) Intensive IP psychotherapy, and psychotropic mgmt 3) Social work to facilitate and aid in OP stabilization and support services to decrease chance for relapse and frequent re admissions. 4) mgmt of co-morbid conditions  [name],Jeffrey E 03/29/2013, 11:10 PM   Follow up evaluation for inpatient treatment:  Face to face interview and consult with Dr. Lolly Mustache  Assessment/Plan:  Axis I: Major Depression, Recurrent severe Axis II: Deferred Axis IV: other psychosocial or environmental problems and problems related to social environment Axis V: 11-20 some danger of hurting self or others possible OR occasionally fails to maintain minimal personal hygiene OR gross impairment in communication  Patient states the he continues to be depressed with suicidal thoughts; with plan to overdose  Recommendation:  Inpatient treatment.  Agree with previous assessment by Donell Sievert PA-C Patient accepted to [facility]  Shuvon B. Rankin FNP-BC Family Nurse Practitioner, Board Certified  03/30/2013 11:30 AM

## 2013-03-30 ENCOUNTER — Inpatient Hospital Stay (HOSPITAL_COMMUNITY)
Admission: AD | Admit: 2013-03-30 | Discharge: 2013-04-06 | DRG: 885 | Disposition: A | Discharge status: Home / Self Care | Payer: Medicaid Other | Source: Intra-hospital | Attending: Psychiatry | Admitting: Psychiatry

## 2013-03-30 ENCOUNTER — Encounter (HOSPITAL_COMMUNITY): Payer: Self-pay | Admitting: Intensive Care

## 2013-03-30 ENCOUNTER — Encounter (HOSPITAL_COMMUNITY): Payer: Self-pay | Admitting: Registered Nurse

## 2013-03-30 DIAGNOSIS — F322 Major depressive disorder, single episode, severe without psychotic features: Secondary | ICD-10-CM | POA: Diagnosis present

## 2013-03-30 DIAGNOSIS — F411 Generalized anxiety disorder: Secondary | ICD-10-CM | POA: Diagnosis present

## 2013-03-30 DIAGNOSIS — F332 Major depressive disorder, recurrent severe without psychotic features: Principal | ICD-10-CM | POA: Diagnosis present

## 2013-03-30 DIAGNOSIS — R45851 Suicidal ideations: Secondary | ICD-10-CM

## 2013-03-30 DIAGNOSIS — G894 Chronic pain syndrome: Secondary | ICD-10-CM | POA: Diagnosis present

## 2013-03-30 DIAGNOSIS — K219 Gastro-esophageal reflux disease without esophagitis: Secondary | ICD-10-CM | POA: Diagnosis present

## 2013-03-30 DIAGNOSIS — Z79899 Other long term (current) drug therapy: Secondary | ICD-10-CM

## 2013-03-30 DIAGNOSIS — R45850 Homicidal ideations: Secondary | ICD-10-CM

## 2013-03-30 HISTORY — DX: Gastro-esophageal reflux disease without esophagitis: K21.9

## 2013-03-30 MED ORDER — CYCLOBENZAPRINE HCL 10 MG PO TABS
10.0000 mg | ORAL_TABLET | Freq: Three times a day (TID) | ORAL | Status: DC | PRN
  Administered 2013-03-31: 10 mg via ORAL
  Filled 2013-03-30: qty 1

## 2013-03-30 MED ORDER — TRAZODONE HCL 150 MG PO TABS
150.0000 mg | ORAL_TABLET | Freq: Every day | ORAL | Status: DC
  Administered 2013-03-30: 150 mg via ORAL
  Filled 2013-03-30: qty 1
  Filled 2013-03-30: qty 3
  Filled 2013-03-30 (×2): qty 1

## 2013-03-30 MED ORDER — DIPHENHYDRAMINE HCL 25 MG PO CAPS
25.0000 mg | ORAL_CAPSULE | Freq: Every evening | ORAL | Status: DC | PRN
  Administered 2013-03-30 – 2013-03-31 (×2): 25 mg via ORAL

## 2013-03-30 MED ORDER — ALUM & MAG HYDROXIDE-SIMETH 200-200-20 MG/5ML PO SUSP: 30.0000 mL | ORAL | Status: DC | PRN

## 2013-03-30 MED ORDER — NICOTINE 21 MG/24HR TD PT24
21.0000 mg | MEDICATED_PATCH | Freq: Every day | TRANSDERMAL | Status: DC
  Filled 2013-03-30 (×4): qty 1

## 2013-03-30 MED ORDER — MAGNESIUM HYDROXIDE 400 MG/5ML PO SUSP: 30.0000 mL | Freq: Every day | ORAL | Status: DC | PRN

## 2013-03-30 NOTE — ED Notes (Signed)
D/C to [facility]. Belongings bag x2 given to hospital security. Ambulatory without difficulty. Pain score is a 4-states this is his normal. He has chronic neck pain. Escorted by hospital security and mental health tech to [facility].

## 2013-03-30 NOTE — BHH Counselor (Signed)
This pt has been evaluated by Donell Sievert, PA and accepted to [facility] for treatment, Dr Elsie Saas attending, [phone]

## 2013-03-30 NOTE — BHH Counselor (Signed)
Patient accepted to [facility] by Donell Sievert Room to Dr. Elsie Saas # [phone]. EDP-[facility] was notified of patient's disposition. Patient's nurse was also notified of patient's disposition. The nursing report # at [facility] is [phone]. Support paperwork was completed and faxed to [facility].
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
## 2013-03-30 NOTE — Tx Team (Signed)
Initial Interdisciplinary Treatment Plan  PATIENT STRENGTHS: (choose at least two) Ability for insight Active sense of humor Average or above average intelligence Capable of independent living Communication skills General fund of knowledge  PATIENT STRESSORS: Financial difficulties Health problems Medication change or noncompliance Occupational concerns Substance abuse   PROBLEM LIST: Problem List/Patient Goals Date to be addressed Date deferred Reason deferred Estimated date of resolution  Depression 03/30/13                                                      DISCHARGE CRITERIA:  Ability to meet basic life and health needs Adequate post-discharge living arrangements Improved stabilization in mood, thinking, and/or behavior Medical problems require only outpatient monitoring  PRELIMINARY DISCHARGE PLAN: Attend aftercare/continuing care group Outpatient therapy  PATIENT/FAMIILY INVOLVEMENT: This treatment plan has been presented to and reviewed with the patient, Jeffrey [name].  The patient has been given the opportunity to ask questions and make suggestions.  Nestor Ramp [facility] 03/30/2013, 5:35 PM

## 2013-03-30 NOTE — BH Assessment (Signed)
Assessment Note   Jeffrey [name] is a 52 y.o. male presenting to [facility] with depression and SI.  Pt has plan to overdose on medications.  Pt states he's been SI for 1 month.  Pt reports the following precip event: pt is living with a difficult roommate and doesn't feel safe in this current situation.  Pt admits that he had thoughts of harming his roommate, but no longer endorses those thoughts.  Pt says he feels hopeless and helpless in his living arrangement. Pt has 2 past suicide attempts by overdose and has several inpt admissions with [facility], [facility], [facility] and [place].  Pt has current outpatient services with [facility] mental health.    Pt denies any SA, however admitted to using crack 3 wks ago.  Pt endorses: hopelessness, helplessness, poor appetite and insomnia for 3-4 wks.  Pt is unable to contract for safety.      Axis I: Major Depression, Recurrent severe Axis II: Deferred Axis III:  Past Medical History  Diagnosis Date  . Chronic pain   . Depression   . Anxiety   . Suicidal overdose    Axis IV: housing problems, other psychosocial or environmental problems, problems related to social environment and problems with primary support group Axis V: 31-40 impairment in reality testing  Past Medical History:  Past Medical History  Diagnosis Date  . Chronic pain   . Depression   . Anxiety   . Suicidal overdose     Past Surgical History  Procedure Laterality Date  . Ankle surgery    . Hernia repair    . Neck fusion      Family History: No family history on file.  Social History:  reports that he has been smoking.  He does not have any smokeless tobacco history on file. He reports that he does not drink alcohol or use illicit drugs.  Additional Social History:  Alcohol / Drug Use Pain Medications: See MAR  Prescriptions: See MAR  Over the Counter: See MAR  History of alcohol / drug use?: No history of alcohol / drug abuse  CIWA: CIWA-Ar BP:  114/77 mmHg Pulse Rate: 80 COWS:    Allergies:  Allergies  Allergen Reactions  . Abilify (Aripiprazole) Swelling  . Ativan (Lorazepam)     Extreme nervousness  . Celexa (Citalopram)     Nervousness  . Effexor (Venlafaxine)     Trouble urinating  . Levaquin (Levofloxacin) Diarrhea  . Lipitor (Atorvastatin)     Muscle weakness  . Prozac (Fluoxetine)     Difficulty walking  . Toradol (Ketorolac Tromethamine) Nausea And Vomiting  . Tylenol (Acetaminophen)     GI Bleeds, tolerates ibuprofen  . Valium (Diazepam)     Nervousness    Home Medications:  (Not in a hospital admission)  OB/GYN Status:  No LMP for male patient.  General Assessment Data [facility]: [facility] Living Arrangements: Other (Comment) (Lives with roommate ) Can pt return to current living arrangement?: Yes Admission Status: Voluntary Is patient capable of signing voluntary admission?: Yes Transfer from: Acute Hospital Referral Source: MD  Education Status Is patient currently in school?: No Current Grade: None  Highest grade of school patient has completed: None  Name of school: None  Contact person: None   Risk to self Suicidal Ideation: Yes-Currently Present Suicidal Intent: Yes-Currently Present Is patient at risk for suicide?: Yes Suicidal Plan?: Yes-Currently Present Specify Current Suicidal Plan: Overdose on medications  Access to Means:  Yes Specify Access to Suicidal Means: Medications  What has been your use of drugs/alcohol within the last 12 months?: Pt denies  Previous Attempts/Gestures: Yes How many times?: 2 Other Self Harm Risks: None  Triggers for Past Attempts: Unpredictable Intentional Self Injurious Behavior: None Family Suicide History: No Recent stressful life event(s): Conflict (Comment) (Issues with current roommate ) Persecutory voices/beliefs?: No Depression: Yes Depression Symptoms: Loss of interest in usual pleasures;Insomnia;Despondent;Feeling  angry/irritable Substance abuse history and/or treatment for substance abuse?: No Suicide prevention information given to non-admitted patients: Not applicable  Risk to Others Homicidal Ideation: No Thoughts of Harm to Others: No Current Homicidal Intent: No Current Homicidal Plan: No Access to Homicidal Means: No Identified Victim: None  History of harm to others?: No Assessment of Violence: None Noted Violent Behavior Description: None  Does patient have access to weapons?: No Criminal Charges Pending?: No Does patient have a court date: No  Psychosis Hallucinations: None noted Delusions: None noted  Mental Status Report Appear/Hygiene: Disheveled Eye Contact: Fair Motor Activity: Unremarkable Speech: Logical/coherent Level of Consciousness: Alert Mood: Depressed;Helpless;Sad Affect: Depressed;Sad Anxiety Level: None Thought Processes: Coherent;Relevant Judgement: Impaired Orientation: Person;Place;Time;Situation Obsessive Compulsive Thoughts/Behaviors: None  Cognitive Functioning Concentration: Normal Memory: Recent Intact;Remote Intact IQ: Average Insight: Poor Impulse Control: Poor Appetite: Fair Weight Loss: 0 Weight Gain: 0 Sleep: Decreased Total Hours of Sleep: 3 Vegetative Symptoms: None  ADLScreening [facility] Assessment Services) Patient's cognitive ability adequate to safely complete daily activities?: Yes Patient able to express need for assistance with ADLs?: Yes Independently performs ADLs?: Yes (appropriate for developmental age)  Abuse/Neglect [employer]) Physical Abuse: Denies Verbal Abuse: Denies Sexual Abuse: Denies  Prior Inpatient Therapy Prior Inpatient Therapy: Yes Prior Therapy Dates: 2012,2010,2009,2004 Prior Therapy Facilty/Provider(s): [facility], [facility], [facility], [facility] Reason for Treatment: SI/Depression/Anxiety   Prior Outpatient Therapy Prior Outpatient Therapy: Yes Prior Therapy Dates: Current  Prior Therapy  Facilty/Provider(s): [facility]  Reason for Treatment: Medication Mgt   ADL Screening (condition at time of admission) Patient's cognitive ability adequate to safely complete daily activities?: Yes Patient able to express need for assistance with ADLs?: Yes Independently performs ADLs?: Yes (appropriate for developmental age) Weakness of Legs: None Weakness of Arms/Hands: None  Home Assistive Devices/Equipment Home Assistive Devices/Equipment: None  Therapy Consults (therapy consults require a physician order) PT Evaluation Needed: No OT Evalulation Needed: No SLP Evaluation Needed: No Abuse/Neglect Assessment (Assessment to be complete while patient is alone) Physical Abuse: Denies Verbal Abuse: Denies Sexual Abuse: Denies Exploitation of patient/patient's resources: Denies Self-Neglect: Denies Values / Beliefs Cultural Requests During Hospitalization: None Spiritual Requests During Hospitalization: None Consults Spiritual Care Consult Needed: No Social Work Consult Needed: No Merchant navy officer (For Healthcare) Advance Directive: Patient does not have advance directive;Patient would not like information Pre-existing out of facility DNR order (yellow form or pink MOST form): No Nutrition Screen- [facility] Adult/[facility] Patient's home diet: Regular Have you recently lost weight without trying?: No Have you been eating poorly because of a decreased appetite?: No Malnutrition Screening Tool Score: 0  Additional Information 1:1 In Past 12 Months?: No CIRT Risk: No Elopement Risk: No Does patient have medical clearance?: Yes     Disposition:  Disposition Initial Assessment Completed for this Encounter: Yes Disposition of Patient: Inpatient treatment program;Referred to (Pt accepted to [facility] by Donell Sievert, PA, pending 500 hall be) Type of inpatient treatment program: Adult Patient referred to: Other (Comment) (Pt accepted by Donell Sievert, PA, pending 500 hall  bed)  On Site Evaluation by:  Reviewed with Physician:     Murrell Redden 03/30/2013 5:20 AM

## 2013-03-30 NOTE — ED Notes (Signed)
Report called to [facility]. Will transfer to [facility] now.

## 2013-03-31 DIAGNOSIS — Z9119 Patient's noncompliance with other medical treatment and regimen: Secondary | ICD-10-CM

## 2013-03-31 DIAGNOSIS — G894 Chronic pain syndrome: Secondary | ICD-10-CM | POA: Diagnosis present

## 2013-03-31 DIAGNOSIS — F1910 Other psychoactive substance abuse, uncomplicated: Secondary | ICD-10-CM

## 2013-03-31 MED ORDER — GABAPENTIN 800 MG PO TABS
800.0000 mg | ORAL_TABLET | Freq: Three times a day (TID) | ORAL | Status: DC
  Administered 2013-03-31 – 2013-04-06 (×18): 800 mg via ORAL
  Filled 2013-03-31 (×23): qty 1

## 2013-03-31 MED ORDER — TRAZODONE HCL 150 MG PO TABS
150.0000 mg | ORAL_TABLET | Freq: Every day | ORAL | Status: DC
  Administered 2013-03-31 – 2013-04-05 (×6): 150 mg via ORAL
  Filled 2013-03-31 (×8): qty 1

## 2013-03-31 MED ORDER — PANTOPRAZOLE SODIUM 40 MG PO TBEC
40.0000 mg | DELAYED_RELEASE_TABLET | Freq: Every day | ORAL | Status: DC
  Administered 2013-03-31 – 2013-04-06 (×7): 40 mg via ORAL
  Filled 2013-03-31 (×9): qty 1

## 2013-03-31 MED ORDER — HYDROXYZINE HCL 50 MG PO TABS
50.0000 mg | ORAL_TABLET | Freq: Four times a day (QID) | ORAL | Status: DC | PRN
  Administered 2013-04-01 – 2013-04-06 (×8): 50 mg via ORAL
  Filled 2013-03-31: qty 1

## 2013-03-31 MED ORDER — CYCLOBENZAPRINE HCL 10 MG PO TABS
5.0000 mg | ORAL_TABLET | Freq: Three times a day (TID) | ORAL | Status: DC | PRN
  Administered 2013-04-01: 5 mg via ORAL
  Filled 2013-03-31: qty 1

## 2013-03-31 MED ORDER — DESVENLAFAXINE SUCCINATE ER 100 MG PO TB24
100.0000 mg | ORAL_TABLET | Freq: Every day | ORAL | Status: DC
  Administered 2013-03-31 – 2013-04-06 (×7): 100 mg via ORAL
  Filled 2013-03-31 (×4): qty 1

## 2013-03-31 MED ORDER — SIMVASTATIN 10 MG PO TABS
10.0000 mg | ORAL_TABLET | Freq: Every day | ORAL | Status: DC
  Administered 2013-03-31 – 2013-04-05 (×6): 10 mg via ORAL
  Filled 2013-03-31 (×8): qty 1

## 2013-03-31 MED ORDER — PROPRANOLOL HCL 10 MG PO TABS: 10.0000 mg | ORAL_TABLET | Freq: Two times a day (BID) | ORAL | Status: DC

## 2013-03-31 MED ORDER — IBUPROFEN 800 MG PO TABS
800.0000 mg | ORAL_TABLET | Freq: Three times a day (TID) | ORAL | Status: DC | PRN
  Administered 2013-04-01: 800 mg via ORAL
  Filled 2013-03-31: qty 1

## 2013-03-31 NOTE — [facility] Suicide Risk Assessment (Signed)
Suicide Risk Assessment  Admission Assessment     Nursing information obtained from:    Demographic factors:    Current Mental Status:    Loss Factors:    Historical Factors:    Risk Reduction Factors:     CLINICAL FACTORS:   Severe Anxiety and/or Agitation Depression:   Aggression Anhedonia Comorbid alcohol abuse/dependence Hopelessness Impulsivity Recent sense of peace/wellbeing Severe Alcohol/Substance Abuse/Dependencies Personality Disorders:   Comorbid depression Chronic Pain Unstable or Poor Therapeutic Relationship Previous Psychiatric Diagnoses and Treatments Medical Diagnoses and Treatments/Surgeries  COGNITIVE FEATURES THAT CONTRIBUTE TO RISK:  Closed-mindedness Loss of executive function Polarized thinking    SUICIDE RISK:   Moderate:  Frequent suicidal ideation with limited intensity, and duration, some specificity in terms of plans, no associated intent, good self-control, limited dysphoria/symptomatology, some risk factors present, and identifiable protective factors, including available and accessible social support.  PLAN OF CARE: Admit to [facility] from [facility] for depression with suicidal and homicidal ideation and non compliance with medication.  I certify that inpatient services furnished can reasonably be expected to improve the patient's condition.  Declyn Delsol,JANARDHAHA R. 03/31/2013, 1:31 PM

## 2013-03-31 NOTE — [facility] Group Notes (Signed)
[facility] LCSW Group Therapy  [facility] 1:15 - 2:30 PM  03/31/2013. 2:40 PM   Type of Therapy:  Group Therapy  Participation Level:  Did Not Attend  Wynn Banker 03/31/2013  2:40 PM

## 2013-03-31 NOTE — Progress Notes (Signed)
D: Patient denies auditory and visual hallucinations. The patient is having passive SI but verbally contracts for safety. The patient is also having HI towards his previous roommate but pt states that he will "go somewhere else to stay" when he is discharged. The patient is attending groups and interacting appropriately within the milieu. The patient also reports to RN that he is having occasional muscle spasms.  A: Patient given emotional support from RN. Patient encouraged to come to staff with concerns and/or questions. Patient's medication routine continued. Patient's orders and plan of care reviewed.  R: Patient remains cooperative. Will continue to monitor patient q15 minutes for safety.
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
## 2013-03-31 NOTE — [facility] Group Notes (Deleted)
[facility] LCSW Group Therapy  [facility] 1:15 - 2:30 PM  03/31/2013. 2:41 PM   Type of Therapy:  Group Therapy  Participation Level:  Active  Participation Quality:  Attentive  Affect:  Appropriate  Cognitive:  Appropriate  Insight:  Developing/Improving and Engaged  Engagement in Therapy:  Developing/Improving Engaged  Modes of Intervention:  Discussion, Education, Exploration, Problem-Solving, Rapport Building, Support   Summary of Progress/Problems:  Patient listened attentively to speaker from [facility]. She asked questions about the speaker's experience and shared she would be interesting in their programming.  Wynn Banker 03/31/2013  2:41 PM

## 2013-03-31 NOTE — Progress Notes (Addendum)
D: Pt is depressed in affect and mood. Pt is also expressing some anxiety.  Pt observed with minimum interaction within the milieu. Pt is concerned about the discontinuation of some of his home meds. A: Writer was able to obtain a prn order for Vistaril and Ibuprofen. Pt was using these medications prior to admission.Writer encouraged pt to speak with his Doctor about his medication concerns. Writer administered scheduled medications to pt. Continued support and availability as needed was extended to this pt. Writer encouraged pt to attend group.  Staff continue to monitor pt with q110min checks.  R: No adverse drug reactions noted. Pt attended group this evening. Pt receptive to treatment. Pt remains safe at this time.

## 2013-03-31 NOTE — BHH Group Notes (Signed)
[facility] LCSW Aftercare Discharge Planning Group Note   03/31/2013 10:29 AM  Participation Quality:  Did not attend group.  Carlea Badour, Joesph July

## 2013-03-31 NOTE — Progress Notes (Signed)
Patient ID: ACEA YAGI, male   DOB: June 15, 1961, 52 y.o.   MRN: [id number] Pt did not attend group.
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
## 2013-04-01 MED ORDER — LIDOCAINE 5 % EX PTCH
1.0000 | MEDICATED_PATCH | CUTANEOUS | Status: DC
  Administered 2013-04-01: 1 via TRANSDERMAL
  Filled 2013-04-01 (×2): qty 1

## 2013-04-01 MED ORDER — PROPRANOLOL HCL 10 MG PO TABS
10.0000 mg | ORAL_TABLET | Freq: Two times a day (BID) | ORAL | Status: DC
  Administered 2013-04-01 – 2013-04-06 (×10): 10 mg via ORAL
  Filled 2013-04-01 (×14): qty 1

## 2013-04-01 MED ORDER — TRAMADOL HCL 50 MG PO TABS
50.0000 mg | ORAL_TABLET | Freq: Every evening | ORAL | Status: DC | PRN
  Administered 2013-04-01 – 2013-04-05 (×5): 50 mg via ORAL
  Filled 2013-04-01 (×5): qty 1

## 2013-04-01 NOTE — BHH Group Notes (Signed)
[facility] LCSW Group Therapy  Feelings Around Relapse 1:15 -2:30        04/01/2013  12:56 PM   Type of Therapy:  Group Therapy  Participation Level:  Appropriate  Participation Quality:  Appropriate  Affect:  Appropriate  Cognitive:  Attentive Appropriate  Insight:  Engaged  Engagement in Therapy:  Engaged  Modes of Intervention:  Discussion Exploration Problem-Solving Supportive  Summary of Progress/Problems:  The topic for today was feelings around relapse.    Patient processed feelings toward relapse and was able to relate to peers. Patient identified coping skills that can be used to prevent a relapse.Patient shares he has to stay away from people, places and things that lead to relapse on alcohol and violence.   Wynn Banker 04/01/2013 12:56 PM

## 2013-04-01 NOTE — Tx Team (Addendum)
Interdisciplinary Treatment Plan Update   Date Reviewed:  04/01/2013  Time Reviewed:  8:40 AM  Progress in Treatment:   Attending groups: Yes Participating in groups: Yes Taking medication as prescribed: Yes  Tolerating medication: Yes Family/Significant other contact made: No, but will ask patient for consent for collateral contact Patient understands diagnosis: Yes  Discussing patient identified problems/goals with staff: Yes Medical problems stabilized or resolved: Yes Denies suicidal/homicidal ideation: Yes Patient has not harmed self or others: Yes  For review of initial/current patient goals, please see plan of care.  Estimated Length of Stay:  3-5 days  Reasons for Continued Hospitalization:  Anxiety Depression Medication stabilization Suicidal ideation Homicidal ideation  New Problems/Goals identified:    Discharge Plan or Barriers:   Home with outpatient follow up at [facility] [address]  Additional Comments:  Patient continues to endorses SI/HI.  He contracts for safety while in the hospital but unable to contract outside of the hospital  Attendees:  Patient:  04/01/2013 8:40 AM   Signature: Mervyn Gay, MD 04/01/2013 8:40 AM  Signature: 04/01/2013 8:40 AM  Signature: Harold Barban, RN 04/01/2013 8:40 AM  Signature:  Nestor Ramp, RN 04/01/2013 8:40 AM  Signature:  Neill Loft RN 04/01/2013 8:40 AM  Signature:  Juline Patch, LCSW 04/01/2013 8:40 AM  Signature:  Dede Query, PA 04/01/2013 8:40 AM  Signature:  Maseta Dorley,Care Coordinator 04/01/2013 8:40 AM  Signature: Fransisca Kaufmann, [facility] 04/01/2013 8:40 AM  Signature:    Signature:    Signature:      Scribe for Treatment Team:   Juline Patch,  04/01/2013 8:40 AM

## 2013-04-01 NOTE — Progress Notes (Signed)
Adult Psychoeducational Group Note  Date:  04/01/2013 Time:  8:00PM Group Topic/Focus:  Wrap-Up Group:   The focus of this group is to help patients review their daily goal of treatment and discuss progress on daily workbooks.  Participation Level:  Active  Participation Quality:  Appropriate and Attentive  Affect:  Appropriate  Cognitive:  Alert and Appropriate  Insight: Appropriate  Engagement in Group:  Engaged  Modes of Intervention:  Discussion  Additional Comments:  Pt. Was attentive and appropriate during tonight's group. Pt stated that he had had visitors today which is his support group. Pt stated that he his feeling good and enjoys today social work group.   Bing Plume D 04/01/2013, 8:53 PM
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
## 2013-04-01 NOTE — Progress Notes (Signed)
Patient ID: Jeffrey [name], male   DOB: 11-08-1960, 52 y.o.   MRN: [id number]  D: Patient pleasant and cooperative on assessment but does endorse depression. Pt also c/o passive SI. Pt attending group session. A: Q 15 minute safety checks, encourage staff/peer interaction and group participation. Administer medications as ordered by MD. R: Pt verbally contracts for safety; no s/s of distress noted.

## 2013-04-01 NOTE — Progress Notes (Signed)
D: Patient denies auditory and visual hallucinations. The patient is having passive SI but verbally contracts for safety. The patient is also still having HI towards his previous roommate but pt states that he "will not act on these thoughts" when he gets discharged. The patient is attending some groups but does isolate to his room at times. The patient is requesting that the MD prescribe him "pain medications and lidocaine patches."  A: Patient given emotional support from RN. Patient encouraged to come to staff with concerns and/or questions. Patient's medication routine continued. Patient's orders and plan of care reviewed. Patient referred to MD for medication requests and NP made aware as well.  R: Patient remains cooperative. Will continue to monitor patient q15 minutes for safety.

## 2013-04-01 NOTE — Progress Notes (Signed)
[facility] MD Progress Note  04/01/2013 2:24 PM Jeffrey [name]  MRN:  [id number] Subjective:   Patient observed resting in bed. He rates his depression at nine and anxiety at seven. Reports passive SI with plan to overdose. Patient talked about how his living situation with his roommate that is Bipolar was very unhealthy for him. He feels as though he needs a new situation and plans to live with another friend after discharge. He admits to being noncompliant with medications "skipping days here and there" prior to admission.   Diagnosis:   Axis I: Major Depression, Recurrent severe and Substance Abuse Axis II: Deferred Axis III:  Past Medical History  Diagnosis Date  . Chronic pain   . Depression   . Anxiety   . Suicidal overdose   . GERD (gastroesophageal reflux disease)    Axis IV: economic problems, educational problems, housing problems, occupational problems, other psychosocial or environmental problems and problems related to social environment Axis V: 41-50 serious symptoms  ADL's:  Intact  Sleep: Fair  Appetite:  Fair  Suicidal Ideation:  Plan:  Overdose on medications Intent:  Present but contracts Means:  Safety maintained in the hospital Homicidal Ideation:  Denies AEB (as evidenced by):  Psychiatric Specialty Exam: Review of Systems  Constitutional: Negative.   HENT: Negative.   Eyes: Negative.   Respiratory: Negative.   Cardiovascular: Negative.   Gastrointestinal: Negative.   Genitourinary: Negative.   Musculoskeletal: Positive for back pain.  Skin: Negative.   Neurological: Negative.   Endo/Heme/Allergies: Negative.   Psychiatric/Behavioral: Positive for depression and suicidal ideas. Negative for hallucinations, memory loss and substance abuse. The patient is nervous/anxious. The patient does not have insomnia.     Blood pressure 148/86, pulse 86, temperature 98.5 F (36.9 C), temperature source Oral, resp. rate 17, height 5\' 10"  (1.778 m), weight  102.059 kg (225 lb), SpO2 97.00%.Body mass index is 32.28 kg/(m^2).  General Appearance: Casual and Disheveled  Eye Contact::  Fair  Speech:  Clear and Coherent  Volume:  Normal  Mood:  Depressed, Dysphoric and Hopeless  Affect:  Depressed and Flat  Thought Process:  Circumstantial  Orientation:  Full (Time, Place, and Person)  Thought Content:  WDL  Suicidal Thoughts:  Yes.  with intent/plan  Homicidal Thoughts:  No  Memory:  Immediate;   Good Recent;   Fair Remote;   Fair  Judgement:  Impaired  Insight:  Shallow  Psychomotor Activity:  Normal  Concentration:  Good  Recall:  Fair  Akathisia:  No  Handed:  Right  AIMS (if indicated):     Assets:  Communication Skills Desire for Improvement Housing Leisure Time Physical Health Resilience  Sleep:  Number of Hours: 6.75   Current Medications: Current Facility-Administered Medications  Medication Dose Route Frequency Provider Last Rate Last Dose  . alum & mag hydroxide-simeth (MAALOX/MYLANTA) 200-200-20 MG/5ML suspension 30 mL  30 mL Oral Q4H PRN Fransisca Kaufmann, NP      . cyclobenzaprine (FLEXERIL) tablet 5 mg  5 mg Oral TID PRN Nehemiah Settle, MD   5 mg at 04/01/13 0750  . desvenlafaxine (PRISTIQ) 24 hr tablet 100 mg  100 mg Oral Daily Nehemiah Settle, MD   100 mg at 04/01/13 0750  . diphenhydrAMINE (BENADRYL) capsule 25 mg  25 mg Oral QHS PRN Kerry Hough, PA-C   25 mg at 03/31/13 2141  . gabapentin (NEURONTIN) tablet 800 mg  800 mg Oral TID Nehemiah Settle, MD   800 mg at  04/01/13 1153  . hydrOXYzine (ATARAX/VISTARIL) tablet 50 mg  50 mg Oral Q6H PRN Kerry Hough, PA-C      . ibuprofen (ADVIL,MOTRIN) tablet 800 mg  800 mg Oral Q8H PRN Kerry Hough, PA-C   800 mg at 04/01/13 1154  . lidocaine (LIDODERM) 5 % 1 patch  1 patch Transdermal Q24H Fransisca Kaufmann, NP      . magnesium hydroxide (MILK OF MAGNESIA) suspension 30 mL  30 mL Oral Daily PRN Fransisca Kaufmann, NP      . pantoprazole (PROTONIX) EC  tablet 40 mg  40 mg Oral Daily Nehemiah Settle, MD   40 mg at 04/01/13 0750  . propranolol (INDERAL) tablet 10 mg  10 mg Oral BID Fransisca Kaufmann, NP      . simvastatin (ZOCOR) tablet 10 mg  10 mg Oral q1800 Nehemiah Settle, MD   10 mg at 03/31/13 1705  . traMADol (ULTRAM) tablet 50 mg  50 mg Oral QHS PRN Fransisca Kaufmann, NP      . traZODone (DESYREL) tablet 150 mg  150 mg Oral QHS Nehemiah Settle, MD   150 mg at 03/31/13 2140    Lab Results: No results found for this or any previous visit (from the past 48 hour(s)).  Physical Findings: AIMS: Facial and Oral Movements Muscles of Facial Expression: None, normal Lips and Perioral Area: None, normal Jaw: None, normal Tongue: None, normal,Extremity Movements Upper (arms, wrists, hands, fingers): None, normal Lower (legs, knees, ankles, toes): None, normal, Trunk Movements Neck, shoulders, hips: None, normal, Overall Severity Severity of abnormal movements (highest score from questions above): None, normal Incapacitation due to abnormal movements: None, normal Patient's awareness of abnormal movements (rate only patient's report): No Awareness, Dental Status Current problems with teeth and/or dentures?: No Does patient usually wear dentures?: No  CIWA:    COWS:     Treatment Plan Summary: Daily contact with patient to assess and evaluate symptoms and progress in treatment Medication management  Plan: Continue crisis management and stabilization.  Medication management: Reviewed with patient who stated no untoward effects. Continue Inderal for high blood pressure. Order Lidocaine patch and Ultram at hs prn pain.  Encouraged patient to attend groups and participate in group counseling sessions and activities.  Discharge plan in progress.  Address health issues: Vitals reviewed and stable.  Continue current treatment plan.   Medical Decision Making Problem Points:  Established problem, stable/improving (1) and  Review of psycho-social stressors (1) Data Points:  Review of medication regiment & side effects (2)  I certify that inpatient services furnished can reasonably be expected to improve the patient's condition.   Ainsley Sanguinetti NP-C 04/01/2013, 2:24 PM

## 2013-04-02 MED ORDER — LIDOCAINE 5 % EX PTCH
1.0000 | MEDICATED_PATCH | Freq: Every day | CUTANEOUS | Status: DC
  Administered 2013-04-02 – 2013-04-06 (×5): 1 via TRANSDERMAL
  Filled 2013-04-02 (×7): qty 1

## 2013-04-02 NOTE — B[facility] Group Notes (Signed)
[facility] Group Notes: (Clinical Social Work)   04/02/2013      Type of Therapy:  Group Therapy   Participation Level:  Did Not Attend    Ambrose Mantle, LCSW 04/02/2013, 4:56 PM

## 2013-04-02 NOTE — Progress Notes (Signed)
Patient ID: Jeffrey [name], male   DOB: 02-10-1961, 52 y.o.   MRN: [id number] [facility] MD Progress Note  04/02/2013 2:19 PM KEYLON LABELLE  MRN:  [id number] Subjective:   Patient reports that he is "doing a little better." He is still reporting SI with plan to overdose and HI toward former roommate. Patient states "The thoughts are decreasing in intensity but they are still there." Today patient is rating depression at seven and anxiety at five. He reports that he talked to his friend today who will help him find a new living situation. Patient reports that his pain is better after the lidocaine patches were reordered. Leondre is observed lying in bed and he does appear depressed.   Diagnosis:   Axis I: Major Depression, Recurrent severe and Substance Abuse Axis II: Deferred Axis III:  Past Medical History  Diagnosis Date  . Chronic pain   . Depression   . Anxiety   . Suicidal overdose   . GERD (gastroesophageal reflux disease)    Axis IV: economic problems, educational problems, housing problems, occupational problems, other psychosocial or environmental problems and problems related to social environment Axis V: 41-50 serious symptoms  ADL's:  Intact  Sleep: Good  Appetite:  Fair  Suicidal Ideation:  Plan:  Overdose on medications Intent:  Present but contracts Means:  Safety maintained in the hospital Homicidal Ideation:  Denies AEB (as evidenced by):  Psychiatric Specialty Exam: Review of Systems  Constitutional: Negative.   HENT: Negative.   Eyes: Negative.   Respiratory: Negative.   Cardiovascular: Negative.   Gastrointestinal: Negative.   Genitourinary: Negative.   Musculoskeletal: Positive for back pain.  Skin: Negative.   Neurological: Negative.   Endo/Heme/Allergies: Negative.   Psychiatric/Behavioral: Positive for depression and suicidal ideas. Negative for hallucinations, memory loss and substance abuse. The patient is nervous/anxious. The patient does not  have insomnia.     Blood pressure 149/79, pulse 68, temperature 97.5 F (36.4 C), temperature source Oral, resp. rate 18, height 5\' 10"  (1.778 m), weight 102.059 kg (225 lb), SpO2 97.00%.Body mass index is 32.28 kg/(m^2).  General Appearance: Casual and Disheveled  Eye Contact::  Fair  Speech:  Clear and Coherent  Volume:  Normal  Mood:  Depressed, Dysphoric and Hopeless  Affect:  Depressed and Flat  Thought Process:  Circumstantial  Orientation:  Full (Time, Place, and Person)  Thought Content:  WDL  Suicidal Thoughts:  Yes.  with intent/plan  Homicidal Thoughts:  No  Memory:  Immediate;   Good Recent;   Fair Remote;   Fair  Judgement:  Impaired  Insight:  Shallow  Psychomotor Activity:  Normal  Concentration:  Good  Recall:  Fair  Akathisia:  No  Handed:  Right  AIMS (if indicated):     Assets:  Communication Skills Desire for Improvement Housing Leisure Time Physical Health Resilience  Sleep:  Number of Hours: 6.5   Current Medications: Current Facility-Administered Medications  Medication Dose Route Frequency Provider Last Rate Last Dose  . alum & mag hydroxide-simeth (MAALOX/MYLANTA) 200-200-20 MG/5ML suspension 30 mL  30 mL Oral Q4H PRN Fransisca Kaufmann, NP      . cyclobenzaprine (FLEXERIL) tablet 5 mg  5 mg Oral TID PRN Nehemiah Settle, MD   5 mg at 04/01/13 0750  . desvenlafaxine (PRISTIQ) 24 hr tablet 100 mg  100 mg Oral Daily Nehemiah Settle, MD   100 mg at 04/02/13 0841  . diphenhydrAMINE (BENADRYL) capsule 25 mg  25 mg Oral QHS PRN  Kerry Hough, PA-C   25 mg at 03/31/13 2141  . gabapentin (NEURONTIN) tablet 800 mg  800 mg Oral TID Nehemiah Settle, MD   800 mg at 04/02/13 1207  . hydrOXYzine (ATARAX/VISTARIL) tablet 50 mg  50 mg Oral Q6H PRN Kerry Hough, PA-C   50 mg at 04/02/13 1018  . ibuprofen (ADVIL,MOTRIN) tablet 800 mg  800 mg Oral Q8H PRN Kerry Hough, PA-C   800 mg at 04/01/13 1154  . lidocaine (LIDODERM) 5 % 1 patch  1  patch Transdermal Daily Nehemiah Settle, MD   1 patch at 04/02/13 0930  . magnesium hydroxide (MILK OF MAGNESIA) suspension 30 mL  30 mL Oral Daily PRN Fransisca Kaufmann, NP      . pantoprazole (PROTONIX) EC tablet 40 mg  40 mg Oral Daily Nehemiah Settle, MD   40 mg at 04/02/13 0841  . propranolol (INDERAL) tablet 10 mg  10 mg Oral BID Fransisca Kaufmann, NP   10 mg at 04/02/13 0841  . simvastatin (ZOCOR) tablet 10 mg  10 mg Oral q1800 Nehemiah Settle, MD   10 mg at 04/01/13 1704  . traMADol (ULTRAM) tablet 50 mg  50 mg Oral QHS PRN Fransisca Kaufmann, NP   50 mg at 04/01/13 2117  . traZODone (DESYREL) tablet 150 mg  150 mg Oral QHS Nehemiah Settle, MD   150 mg at 04/01/13 2116    Lab Results: No results found for this or any previous visit (from the past 48 hour(s)).  Physical Findings: AIMS: Facial and Oral Movements Muscles of Facial Expression: None, normal Lips and Perioral Area: None, normal Jaw: None, normal Tongue: None, normal,Extremity Movements Upper (arms, wrists, hands, fingers): None, normal Lower (legs, knees, ankles, toes): None, normal, Trunk Movements Neck, shoulders, hips: None, normal, Overall Severity Severity of abnormal movements (highest score from questions above): None, normal Incapacitation due to abnormal movements: None, normal Patient's awareness of abnormal movements (rate only patient's report): No Awareness, Dental Status Current problems with teeth and/or dentures?: No Does patient usually wear dentures?: No  CIWA:    COWS:     Treatment Plan Summary: Daily contact with patient to assess and evaluate symptoms and progress in treatment Medication management  Plan: Continue crisis management and stabilization.  Medication management: Reviewed with patient who stated no untoward effects.  Encouraged patient to attend groups and participate in group counseling sessions and activities.  Discharge plan in progress.  Address health  issues: Vitals reviewed and stable.  Continue current treatment plan.   Medical Decision Making Problem Points:  Established problem, stable/improving (1) and Review of psycho-social stressors (1) Data Points:  Review of medication regiment & side effects (2)  I certify that inpatient services furnished can reasonably be expected to improve the patient's condition.   Javanna Patin NP-C 04/02/2013, 2:19 PM
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
## 2013-04-02 NOTE — Progress Notes (Signed)
D Shayan remains UAL on the 500 hall ...tolerated fair. HE is guarded, somewhat suspicious-acting and reserved. HE is tight-lipped. He attends his groups and pays attention to the discussion. HE is trying to process and to understand hais feeligns and related behaviors. ]    A HE is given his own prestique ( he brought from home) due to him refusing hospital's generic prestique. He requested and was given vistaril 50 mg po, for c/o anxiety at 1018 and he noted  Relief 1 hr later.     R Safety is in place, POC includes maintaining therapeutic relationship and continuing to help pt develop healthier coping skills.

## 2013-04-02 NOTE — Progress Notes (Signed)
[facility] Group Notes:  (Nursing/MHT/Case Management/Adjunct)  Date:  04/02/2013  Time:  2000  Type of Therapy:  Psychoeducational Skills  Participation Level:  Minimal  Participation Quality:  Attentive  Affect:  Flat  Cognitive:  Appropriate  Insight:  Limited  Engagement in Group:  Limited  Modes of Intervention:  Education  Summary of Progress/Problems: The patient stated that he had a good day. He attributes his good day due to the fact that he was able to have some positive telephone calls. His goal for tomorrow is to continue with his recovery.   Hazle Coca S 04/02/2013, 9:23 PM

## 2013-04-03 NOTE — [facility] Group Notes (Signed)
[facility] Group Notes:  (Clinical Social Work)  04/03/2013   3:00-4:00PM  Summary of Progress/Problems:   The main focus of today's process group was to   identify the patient's current support system and decide on other supports that can be put in place.  Four definitions/levels of support were discussed.  An emphasis was placed on using counselor, doctor, therapy groups, 12-step groups, and problem-specific support groups to expand supports.  The group also discussed the stigma that goes along with mental illness and how to be self-protective while asking for additional supports from friends/church members/family.  The patient verbalized understanding of the importance of increasing support to prevent relapse.  He wants to add some people he can trust, instead of just people who are dependent upon him and then bolt when he needs something.  Type of Therapy:  Process Group  Participation Level:  Minimal  Participation Quality:  Attentive  Affect:  Blunted and Depressed  Cognitive:  Appropriate  Insight:  Developing/Improving  Engagement in Therapy:  Developing/Improving  Modes of Intervention:  Education,  Support and Processing  Ambrose Mantle, LCSW 04/03/2013, 4:34 PM

## 2013-04-03 NOTE — Progress Notes (Signed)
D Nikita is seen sitting quietly in the dayroom this afternoon. HE remains guarded, he looks suspicious of his environment. HE slept through both morning groups this morning and has not completed his AM self inventory despite this nurse's repeated and gentle encouragement.  A HE takes his meds as ordered. He requested and was given vistaril 50 mg po for c/o anxiety this morning and he stated relief approx 1 hr later.  R Safety is in place and POC maintaiend.

## 2013-04-03 NOTE — Progress Notes (Signed)
Psychoeducational Group Note  Date:  04/03/2013 Time: 1015 Group Topic/Focus:  Making Healthy Choices:   The focus of this group is to help patients identify negative/unhealthy choices they were using prior to admission and identify positive/healthier coping strategies to replace them upon discharge.  Participation Level:  Did Not Attend   Additional Comments:   Rich Brave 1:38 PM. 04/03/2013

## 2013-04-03 NOTE — Progress Notes (Signed)
Adult Psychoeducational Group Note  Date:  04/03/2013 Time:  6:26 PM  Group Topic/Focus:  Emotional Education:   The focus of this group is to discuss what feelings/emotions are, and how they are experienced.  Participation Level:  Did Not Attend  Participation Quality:  Did not attend  Affect:  Did not attend  Cognitive:  Did not attend  Insight: None  Engagement in Group:  Did not attend  Modes of Intervention:  Did not attend  Additional Comments:  Pt did not attend group.  Caswell Corwin 04/03/2013, 6:26 PM

## 2013-04-03 NOTE — Progress Notes (Signed)
[facility] MD Progress Note  04/03/2013 12:34 PM Jeffrey [name]  MRN:  [id number]  Subjective:  6/10 depression with suicidal ideations "on and off", 4/10 anxiety, 4/10 chronic neck pain--medications in place.  Sleep "fair, not as good as usual", tonight he will try a Vistaril if the Trazodone does not work, appetite good.  He did not attend group this morning, lying in bed resting quietly--encouraged him to participate.  Diagnosis:   Axis I: Major Depression, Recurrent severe Axis II: Deferred Axis III:  Past Medical History  Diagnosis Date  . Chronic pain   . Depression   . Anxiety   . Suicidal overdose   . GERD (gastroesophageal reflux disease)    Axis IV: housing problems, other psychosocial or environmental problems, problems related to social environment and problems with primary support group Axis V: 41-50 serious symptoms  ADL's:  Intact  Sleep: Fair  Appetite:  Good  Suicidal Ideation:  Plan:  None Intent:  None Means:  None Homicidal Ideation:  Denies  Psychiatric Specialty Exam: Review of Systems  Constitutional: Negative.   HENT: Positive for neck pain.   Eyes: Negative.   Respiratory: Negative.   Cardiovascular: Negative.   Gastrointestinal: Negative.   Genitourinary: Negative.   Skin: Negative.   Neurological: Negative.   Endo/Heme/Allergies: Negative.   Psychiatric/Behavioral: Positive for depression. The patient is nervous/anxious.     Blood pressure 125/86, pulse 73, temperature 97 F (36.1 C), temperature source Oral, resp. rate 16, height 5\' 10"  (1.778 m), weight 102.059 kg (225 lb), SpO2 97.00%.Body mass index is 32.28 kg/(m^2).  General Appearance: Casual  Eye Contact::  Fair  Speech:  Normal Rate  Volume:  Normal  Mood:  Anxious and Depressed  Affect:  Congruent  Thought Process:  Coherent  Orientation:  Full (Time, Place, and Person)  Thought Content:  WDL  Suicidal Thoughts:  Yes.  without intent/plan  Homicidal Thoughts:  No  Memory:   Immediate;   Fair Recent;   Fair Remote;   Fair  Judgement:  Fair  Insight:  Fair  Psychomotor Activity:  Decreased  Concentration:  Fair  Recall:  Fair  Akathisia:  No  Handed:  Right  AIMS (if indicated):     Assets:  Communication Skills Resilience Social Support  Sleep:  Number of Hours: 6.5   Current Medications: Current Facility-Administered Medications  Medication Dose Route Frequency Provider Last Rate Last Dose  . alum & mag hydroxide-simeth (MAALOX/MYLANTA) 200-200-20 MG/5ML suspension 30 mL  30 mL Oral Q4H PRN Fransisca Kaufmann, NP      . cyclobenzaprine (FLEXERIL) tablet 5 mg  5 mg Oral TID PRN Nehemiah Settle, MD   5 mg at 04/01/13 0750  . desvenlafaxine (PRISTIQ) 24 hr tablet 100 mg  100 mg Oral Daily Nehemiah Settle, MD   100 mg at 04/02/13 0841  . diphenhydrAMINE (BENADRYL) capsule 25 mg  25 mg Oral QHS PRN Kerry Hough, PA-C   25 mg at 03/31/13 2141  . gabapentin (NEURONTIN) tablet 800 mg  800 mg Oral TID Nehemiah Settle, MD   800 mg at 04/03/13 1203  . hydrOXYzine (ATARAX/VISTARIL) tablet 50 mg  50 mg Oral Q6H PRN Kerry Hough, PA-C   50 mg at 04/03/13 0820  . ibuprofen (ADVIL,MOTRIN) tablet 800 mg  800 mg Oral Q8H PRN Kerry Hough, PA-C   800 mg at 04/01/13 1154  . lidocaine (LIDODERM) 5 % 1 patch  1 patch Transdermal Daily Nehemiah Settle, MD  1 patch at 04/03/13 0819  . magnesium hydroxide (MILK OF MAGNESIA) suspension 30 mL  30 mL Oral Daily PRN Fransisca Kaufmann, NP      . pantoprazole (PROTONIX) EC tablet 40 mg  40 mg Oral Daily Nehemiah Settle, MD   40 mg at 04/03/13 0819  . propranolol (INDERAL) tablet 10 mg  10 mg Oral BID Fransisca Kaufmann, NP   10 mg at 04/03/13 0819  . simvastatin (ZOCOR) tablet 10 mg  10 mg Oral q1800 Nehemiah Settle, MD   10 mg at 04/02/13 1725  . traMADol (ULTRAM) tablet 50 mg  50 mg Oral QHS PRN Fransisca Kaufmann, NP   50 mg at 04/02/13 2101  . traZODone (DESYREL) tablet 150 mg  150 mg  Oral QHS Nehemiah Settle, MD   150 mg at 04/02/13 2101    Lab Results: No results found for this or any previous visit (from the past 48 hour(s)).  Physical Findings: AIMS: Facial and Oral Movements Muscles of Facial Expression: None, normal Lips and Perioral Area: None, normal Jaw: None, normal Tongue: None, normal,Extremity Movements Upper (arms, wrists, hands, fingers): None, normal Lower (legs, knees, ankles, toes): None, normal, Trunk Movements Neck, shoulders, hips: None, normal, Overall Severity Severity of abnormal movements (highest score from questions above): None, normal Incapacitation due to abnormal movements: None, normal Patient's awareness of abnormal movements (rate only patient's report): No Awareness, Dental Status Current problems with teeth and/or dentures?: No Does patient usually wear dentures?: No  CIWA:    COWS:     Treatment Plan Summary: Daily contact with patient to assess and evaluate symptoms and progress in treatment Medication management  Plan:  Review of chart, vital signs, medications, and notes. 1-Individual and group therapy 2-Medication management for depression and anxiety:  Medications reviewed with the patient and he stated no untoward effects, no changes made 3-Coping skills for depression and anxiety 4-Continue crisis stabilization and management 5-Address health issues--monitoring vital signs, stable 6-Treatment plan in progress to prevent relapse of depression and anxiety  Medical Decision Making Problem Points:  Established problem, stable/improving (1) and Review of psycho-social stressors (1) Data Points:  Review of medication regiment & side effects (2)  I certify that inpatient services furnished can reasonably be expected to improve the patient's condition.   Nanine Means, PMH-NP 04/03/2013, 12:34 PM  Reviewed the information documented and agree with the treatment plan.  Marsden Zaino,JANARDHAHA  R. 05/10/2013 12:33 PM

## 2013-04-03 NOTE — Progress Notes (Signed)
Goals Group Note  Date:  04/03/2013 Time:  0915 Group Topic/Focus:  Goals Group: This group is centered on helping patients identify goals they want to work towards and identify steps they need to take to achieve these goals.   Participation Level:  Did Not Attend  Participation Quality:  did not attend  Affect :N/A    Insight:  N/A  Engagement in Group:N/A    Additional Comments:    Rich Brave 1:22 PM. 04/03/2013

## 2013-04-04 MED ORDER — TUBERCULIN PPD 5 UNIT/0.1ML ID SOLN
5.0000 [IU] | Freq: Once | INTRADERMAL | Status: AC
  Administered 2013-04-04: 5 [IU] via INTRADERMAL

## 2013-04-04 MED ORDER — MIRTAZAPINE 15 MG PO TABS
7.5000 mg | ORAL_TABLET | Freq: Every day | ORAL | Status: DC
  Administered 2013-04-04 – 2013-04-05 (×2): 7.5 mg via ORAL
  Filled 2013-04-04: qty 1
  Filled 2013-04-04: qty 2
  Filled 2013-04-04: qty 1
  Filled 2013-04-04: qty 2

## 2013-04-04 NOTE — Progress Notes (Signed)
Adult Psychoeducational Group Note  Date:  04/04/2013 Time:  8:00PM Group Topic/Focus:  Wrap-Up Group:   The focus of this group is to help patients review their daily goal of treatment and discuss progress on daily workbooks.  Participation Level:  Active  Participation Quality:  Appropriate and Attentive  Affect:  Appropriate  Cognitive:  Alert and Appropriate  Insight: Appropriate  Engagement in Group:  Engaged  Modes of Intervention:  Discussion  Additional Comments:  Pt. Was appropriate and attentive during tonight's group discussion. Pt stated that he enjoy attending social work group due to finding out helpful information. Pt stated that he was able to talk to good friend today that he haven't talked to in a while. Pt stated that he is working on building a support group.   Bing Plume D 04/04/2013, 9:21 PM

## 2013-04-04 NOTE — Tx Team (Signed)
Interdisciplinary Treatment Plan Update  Date Reviewed: 04/04/2013  Time Reviewed: 9:45 AM   Progress in Treatment:  Attending groups: Yes  Participating in groups: Yes  Taking medication as prescribed: Yes  Tolerating medication: Yes  Family/Significant other contact made: CSW assessing  Patient understands diagnosis: Yes  Discussing patient identified problems/goals with staff: Yes  Medical problems stabilized or resolved: Yes  Denies suicidal/homicidal ideation: Yes Patient has not harmed self or others: Yes   For review of initial/current patient goals, please see plan of care.   Estimated Length of Stay: 1-2 days  Reasons for Continued Hospitalization:  Anxiety  Depression  Medication stabilization   New Problems/Goals identified:   Discharge Plan or Barriers: CSW assessing for appropriate referrals  Additional Comments: N/A  Attendees:  Patient:  04/04/2013 9:55 AM   Signature:  04/04/2013 9:55 AM   Signature:  04/04/2013 9:55 AM   Signature: Dellia Cloud, RN  04/04/2013 9:55 AM   Signature: Neill Loft, RN  04/04/2013 9:55 AM   Signature: Quintella Reichert, RN 04/04/2013 9:55 AM   Signature: Juline Patch, LCSW  04/04/2013 9:55 AM   Signature: Reyes Ivan, LCSW  04/04/2013 9:55 AM   Signature: Sharin Grave Coordinator  04/04/2013 9:55 AM   Signature: Fransisca Kaufmann, [facility]  04/04/2013 9:55 AM   Signature:    Signature:    Signature:    Scribe for Treatment Team:  Reyes Ivan, LCSWA, 04/04/2013 9:55 AM

## 2013-04-04 NOTE — Progress Notes (Signed)
Recreation Therapy Notes  Date: 06.30.2014 Time: 3:00pm Location: 500 Hall Dayroom      Group Topic/Focus: Coping Skills  Participation Level: Did not attend  [employer], LRT/CTRS  Jearl Klinefelter 04/04/2013 5:28 PM

## 2013-04-04 NOTE — Progress Notes (Signed)
Writer observed patient lying in bed resting when entering his room. Patient reports having had an ok day and he still reports passive si and verbally contracts for safety. 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 Patient attended group this evening and participated. Patient voiced no complaints and is compliant with his medication and request they be given at 2100. Support and encouragement offered, safety maintained on unit with 15 min checks, will continue to monitor.

## 2013-04-04 NOTE — Progress Notes (Signed)
Patient ID: Jeffrey [name], male   DOB: 12-22-1960, 52 y.o.   MRN: [id number] [facility] MD Progress Note  04/04/2013 1:42 PM Jeffrey [name]  MRN:  [id number] Subjective:   Patient rates his depression at six today and is still endorsing passive SI. He is often observed lying in bed and needs encouragement to participate on the unit. Patient reports his sleep has been very poor over the last two nights with "lots of tossing and turning." The patient feels this may have affected his mood to make it more depressed.   Diagnosis:   Axis I: Major Depression, Recurrent severe Axis II: Deferred Axis III:  Past Medical History  Diagnosis Date  . Chronic pain   . Depression   . Anxiety   . Suicidal overdose   . GERD (gastroesophageal reflux disease)    Axis IV: housing problems, other psychosocial or environmental problems, problems related to social environment and problems with primary support group Axis V: 41-50 serious symptoms  ADL's:  Intact  Sleep: Fair  Appetite:  Good  Suicidal Ideation:  Passive SI with plan to "swallow a bunch of pills" Homicidal Ideation:  Denies  Psychiatric Specialty Exam: Review of Systems  Constitutional: Negative.   HENT: Positive for neck pain.   Eyes: Negative.   Respiratory: Negative.   Cardiovascular: Negative.   Gastrointestinal: Negative.   Genitourinary: Negative.   Skin: Negative.   Neurological: Negative.   Endo/Heme/Allergies: Negative.   Psychiatric/Behavioral: Positive for depression and suicidal ideas. Negative for hallucinations, memory loss and substance abuse. The patient is nervous/anxious and has insomnia.     Blood pressure 106/68, pulse 78, temperature 98 F (36.7 C), temperature source Oral, resp. rate 18, height 5\' 10"  (1.778 m), weight 102.059 kg (225 lb), SpO2 97.00%.Body mass index is 32.28 kg/(m^2).  General Appearance: Casual  Eye Contact::  Fair  Speech:  Normal Rate  Volume:  Normal  Mood:  Anxious and Depressed   Affect:  Congruent  Thought Process:  Coherent  Orientation:  Full (Time, Place, and Person)  Thought Content:  WDL  Suicidal Thoughts:  Yes with plan  Homicidal Thoughts:  No  Memory:  Immediate;   Fair Recent;   Fair Remote;   Fair  Judgement:  Fair  Insight:  Fair  Psychomotor Activity:  Decreased  Concentration:  Fair  Recall:  Fair  Akathisia:  No  Handed:  Right  AIMS (if indicated):     Assets:  Communication Skills Resilience Social Support  Sleep:  Number of Hours: 6.75   Current Medications: Current Facility-Administered Medications  Medication Dose Route Frequency Provider Last Rate Last Dose  . alum & mag hydroxide-simeth (MAALOX/MYLANTA) 200-200-20 MG/5ML suspension 30 mL  30 mL Oral Q4H PRN Fransisca Kaufmann, NP      . cyclobenzaprine (FLEXERIL) tablet 5 mg  5 mg Oral TID PRN Nehemiah Settle, MD   5 mg at 04/01/13 0750  . desvenlafaxine (PRISTIQ) 24 hr tablet 100 mg  100 mg Oral Daily Nehemiah Settle, MD   100 mg at 04/04/13 0742  . diphenhydrAMINE (BENADRYL) capsule 25 mg  25 mg Oral QHS PRN Kerry Hough, PA-C   25 mg at 03/31/13 2141  . gabapentin (NEURONTIN) tablet 800 mg  800 mg Oral TID Nehemiah Settle, MD   800 mg at 04/04/13 1212  . hydrOXYzine (ATARAX/VISTARIL) tablet 50 mg  50 mg Oral Q6H PRN Kerry Hough, PA-C   50 mg at 04/03/13 2114  . ibuprofen (ADVIL,MOTRIN)  tablet 800 mg  800 mg Oral Q8H PRN Kerry Hough, PA-C   800 mg at 04/01/13 1154  . lidocaine (LIDODERM) 5 % 1 patch  1 patch Transdermal Daily Nehemiah Settle, MD   1 patch at 04/04/13 0740  . magnesium hydroxide (MILK OF MAGNESIA) suspension 30 mL  30 mL Oral Daily PRN Fransisca Kaufmann, NP      . mirtazapine (REMERON) tablet 7.5 mg  7.5 mg Oral QHS Fransisca Kaufmann, NP      . pantoprazole (PROTONIX) EC tablet 40 mg  40 mg Oral Daily Nehemiah Settle, MD   40 mg at 04/04/13 0740  . propranolol (INDERAL) tablet 10 mg  10 mg Oral BID Fransisca Kaufmann, NP   10 mg  at 04/04/13 0739  . simvastatin (ZOCOR) tablet 10 mg  10 mg Oral q1800 Nehemiah Settle, MD   10 mg at 04/03/13 1820  . traMADol (ULTRAM) tablet 50 mg  50 mg Oral QHS PRN Fransisca Kaufmann, NP   50 mg at 04/03/13 2114  . traZODone (DESYREL) tablet 150 mg  150 mg Oral QHS Nehemiah Settle, MD   150 mg at 04/03/13 2113    Lab Results: No results found for this or any previous visit (from the past 48 hour(s)).  Physical Findings: AIMS: Facial and Oral Movements Muscles of Facial Expression: None, normal Lips and Perioral Area: None, normal Jaw: None, normal Tongue: None, normal,Extremity Movements Upper (arms, wrists, hands, fingers): None, normal Lower (legs, knees, ankles, toes): None, normal, Trunk Movements Neck, shoulders, hips: None, normal, Overall Severity Severity of abnormal movements (highest score from questions above): None, normal Incapacitation due to abnormal movements: None, normal Patient's awareness of abnormal movements (rate only patient's report): No Awareness, Dental Status Current problems with teeth and/or dentures?: No Does patient usually wear dentures?: No  CIWA:    COWS:     Treatment Plan Summary: Daily contact with patient to assess and evaluate symptoms and progress in treatment Medication management  Plan:  Review of chart, vital signs, medications, and notes. 1-Individual and group therapy 2-Medication management for depression and anxiety:  Medications reviewed with the patient and he stated no untoward effects. Add Remeron 7.5 mg at hs to help improve sleep and reduce symptoms of depression.  3-Coping skills for depression and anxiety 4-Continue crisis stabilization and management 5-Address health issues--monitoring vital signs, stable 6-Treatment plan in progress to prevent relapse of depression and anxiety  Medical Decision Making Problem Points:  Established problem, stable/improving (1) and Review of psycho-social stressors  (1) Data Points:  Review of medication regiment & side effects (2)  I certify that inpatient services furnished can reasonably be expected to improve the patient's condition.   Fransisca Kaufmann, NP-C 04/04/2013, 1:42 PM

## 2013-04-04 NOTE — Progress Notes (Signed)
Adult Psychoeducational Group Note  Date:  04/04/2013 Time:  11:00 AM  Group Topic/Focus:  Self Care:   The focus of this group is to help patients understand the importance of self-care in order to improve or restore emotional, physical, spiritual, interpersonal, and financial health.  Participation Level:  Did Not Attend  Participation Quality:   Affect:    Cognitive:    Insight:   Engagement in Group:    Modes of Intervention:    Additional Comments: Pt did not attend this group session.  Malachy Moan 04/04/2013, 1:09 PM

## 2013-04-04 NOTE — [facility] Suicide Risk Assessment (Signed)
[facility] INPATIENT:  Family/Significant Other Suicide Prevention Education  Suicide Prevention Education:  Education Completed; [name] - friend [phone]),  (name of family member/significant other) has been identified by the patient as the family member/significant other with whom the patient will be residing, and identified as the person(s) who will aid the patient in the event of a mental health crisis (suicidal ideations/suicide attempt).  With written consent from the patient, the family member/significant other has been provided the following suicide prevention education, prior to the and/or following the discharge of the patient.  The suicide prevention education provided includes the following:  Suicide risk factors  Suicide prevention and interventions  National Suicide Hotline telephone number  [facility] assessment telephone number  [facility] Emergency Assistance 911  [facility] and/or Residential Mobile Crisis Unit telephone number  Request made of family/significant other to:  Remove weapons (e.g., guns, rifles, knives), all items previously/currently identified as safety concern.    Remove drugs/medications (over-the-counter, prescriptions, illicit drugs), all items previously/currently identified as a safety concern.  The family member/significant other verbalizes understanding of the suicide prevention education information provided.  The family member/significant other agrees to remove the items of safety concern listed above.  Carmina Miller 04/04/2013, 3:31 PM

## 2013-04-04 NOTE — Progress Notes (Signed)
D:  Per pt self inventory pt reports sleeping poor, appetite good, energy level low, ability to pay attention improving, rates depression at a 6 out of 10 and hopelessness at a 6 out of 10.  Denies HI/AVH, endorses Passive SI on and off, contracts for safety.  This am pt was flat depressed and isolative in room.    A:  Emotional support provided, Encouraged pt to continue with treatment plan and attend all group activities, q15 min checks maintained for safety.  R:  Pt is receptive, calm and cooperative, brighter this evening than he was this am, has been out of his room and plans to go to cafeteria for dinner.

## 2013-04-04 NOTE — Progress Notes (Signed)
Pt reports he is doing well this evening.  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  He denies SI/HI/AV to this Clinical research associate.  He voices no needs or concerns.  He understands he needs to develop coping skills when things don't go his way and to stay on his medications even when he is feeling better.  Support and encouragement offered.  Pt makes his needs known to staff.  Safety maintained with q15 minute checks.

## 2013-04-04 NOTE — ED Provider Notes (Signed)
Medical screening examination/treatment/procedure(s) were performed by non-physician practitioner and as supervising physician I was immediately available for consultation/collaboration.  Derwood Kaplan, MD 04/04/13 [phone]

## 2013-04-04 NOTE — Progress Notes (Signed)
Adult Psychoeducational Group Note  Date:  04/03/2013 Time:  800 pm  Group Topic/Focus:  Wrap-Up Group:   The focus of this group is to help patients review their daily goal of treatment and discuss progress on daily workbooks.  Participation Level:  Active  Participation Quality:  Appropriate  Affect:  Appropriate  Cognitive:  Appropriate  Insight: Appropriate  Engagement in Group:  Engaged  Modes of Intervention:  Discussion  Additional Comments:  Pt reported that he made several attempts to make contact with an individual to get his personal affects in order. Pt was disappointed but expressed he would continue to try the following day. Pt reported that he had a good day other that not making the necessary arrangementsfor his personal belongings.  Jeffrey [name] A 04/04/2013, 12:29 AM

## 2013-04-04 NOTE — [facility] Group Notes (Signed)
[facility] LCSW Group Therapy  04/04/2013  1:15 PM   Type of Therapy:  Group Therapy  Participation Level:  Active  Participation Quality:  Appropriate and Attentive  Affect:  Appropriate and Calm  Cognitive:  Alert and Appropriate  Insight:  Developing/Improving and Engaged  Engagement in Therapy:  Developing/Improving and Engaged  Modes of Intervention:  Clarification, Confrontation, Discussion, Education, Exploration, Limit-setting, Orientation, Problem-solving, Rapport Building, Dance movement psychotherapist, Socialization and Support  Summary of Progress/Problems: Pt identified obstacles faced currently and processed barriers involved in overcoming these obstacles. Pt identified steps necessary for overcoming these obstacles and explored motivation (internal and external) for facing these difficulties head on. Pt further identified one area of concern in their lives and chose a goal to focus on for today.  Pt shared that the obstacle he is facing is trying to please people and not taking enough time to care for himself.  Pt shared that his living situation prior to admission was stressful and felt they were using him.  Pt states that he plans to change this living environment by moving out and staying with a positive friend.  Pt actively participated and was attentive to group discussion.    Reyes Ivan, LCSWA 04/04/2013 2:42 PM

## 2013-04-04 NOTE — [facility] Group Notes (Signed)
[facility] LCSW Aftercare Discharge Planning Group Note   04/04/2013 8:45 AM  Participation Quality:  Did Not Attend  Chiara Coltrin Horton, LCSWA 04/04/2013 9:44 AM    

## 2013-04-05 MED ORDER — SIMVASTATIN 10 MG PO TABS: 10.0000 mg | ORAL_TABLET | Freq: Every day | ORAL | Status: DC

## 2013-04-05 MED ORDER — MIRTAZAPINE 7.5 MG PO TABS: 7.5000 mg | ORAL_TABLET | Freq: Every day | ORAL | Status: DC

## 2013-04-05 MED ORDER — LIDOCAINE 5 % EX PTCH: 1.0000 | MEDICATED_PATCH | CUTANEOUS | Status: AC

## 2013-04-05 MED ORDER — TRAMADOL HCL 50 MG PO TABS: 50.0000 mg | ORAL_TABLET | Freq: Every day | ORAL | Status: DC

## 2013-04-05 MED ORDER — SUMATRIPTAN SUCCINATE 50 MG PO TABS: 50.0000 mg | ORAL_TABLET | ORAL | Status: AC | PRN

## 2013-04-05 MED ORDER — CETIRIZINE HCL 10 MG PO TABS: 10.0000 mg | ORAL_TABLET | Freq: Every day | ORAL | Status: DC

## 2013-04-05 MED ORDER — GABAPENTIN 800 MG PO TABS: 800.0000 mg | ORAL_TABLET | Freq: Three times a day (TID) | ORAL | Status: DC

## 2013-04-05 MED ORDER — COQ10 100 MG PO CAPS: 1.0000 | ORAL_CAPSULE | Freq: Every day | ORAL | Status: DC

## 2013-04-05 MED ORDER — DIPHENHYDRAMINE HCL 25 MG PO TABS: 25.0000 mg | ORAL_TABLET | Freq: Every day | ORAL | Status: DC

## 2013-04-05 MED ORDER — ESOMEPRAZOLE MAGNESIUM 40 MG PO CPDR: 40.0000 mg | DELAYED_RELEASE_CAPSULE | Freq: Every day | ORAL | Status: DC

## 2013-04-05 MED ORDER — TRAZODONE HCL 150 MG PO TABS: 150.0000 mg | ORAL_TABLET | Freq: Every day | ORAL | Status: DC

## 2013-04-05 MED ORDER — PROPRANOLOL HCL 10 MG PO TABS: 10.0000 mg | ORAL_TABLET | Freq: Every day | ORAL | Status: DC

## 2013-04-05 MED ORDER — DESVENLAFAXINE SUCCINATE ER 100 MG PO TB24: 100.0000 mg | ORAL_TABLET | Freq: Every day | ORAL | Status: DC

## 2013-04-05 NOTE — Clinical Social Work Note (Signed)
CSW received pt on caseload yesterday and at this time was given a friend's name and number, to provide suicide prevention information to, [facility].  Pt states that Ms. Kennith Center is a friend of his that will pick him up and let him live with her at d/c.  CSW contacted Ms. Hines yesterday and was informed that she actually runs a group home and that in order for pt to come there an FL2 and TB test will need to be ordered.  Ms. Kennith Center states that pt told her that he will d/c on Thursday.  CSW explained that pt was actually scheduled to d/c tomorrow (today) and that pt did not inform CSW that this was a group home, not just a friend's home.  CSW and Ms. Hines have been in contact all day, attempting to arrange pt's d/c quicker than Thursday.  CSW has left 2 voicemails at the time of this note with no return call.  CSW notified Fransisca Kaufmann, NP and CSW team lead about this.  D/c to be discontinued, as pt does not have a secure d/c plan and it needs to be finalized.    Reyes Ivan, LCSWA 04/05/2013  3:17 PM

## 2013-04-05 NOTE — Discharge Summary (Signed)
Physician Discharge Summary Note  Patient:  Jeffrey [name] is an 52 y.o., male MRN:  [id number] DOB:  10-Apr-1961 Patient phone:  [phone] (home)  Patient address:   [address] [place] [address]   Date of Admission:  03/30/2013 Date of Discharge: 04/06/13  Discharge Diagnoses: Principal Problem:   MDD (major depressive disorder), severe Active Problems:   Chronic pain syndrome  Axis Diagnosis:  AXIS I: Major Depression, Recurrent severe  AXIS II: Deferred  AXIS III:  Past Medical History   Diagnosis  Date   .  Chronic pain    .  Depression    .  Anxiety    .  Suicidal overdose    .  GERD (gastroesophageal reflux disease)     AXIS IV: economic problems, housing problems, other psychosocial or environmental problems, problems related to social environment and problems with primary support group  AXIS V: 51-60 moderate symptoms  Level of Care:  OP  Hospital Course:   Jeffrey [name] is a 52 y.o. White single male admitted to [facility] from [facility] for symptoms of depression, anxiety, suicidal or homicidal ideations and partially non compliance with his medication management. He has plan to overdose on medications since he is overwhelmed with his current living situation which is not working for him. He reports the following precip event: pt is living with a difficult roommates and doesn't feel safe in this current situation. He was saying in a homeless shelter and working whiles staying in [employer]. He came to [place] about three months ago because his friend contacted him and willing to be roommate. He has thoughts of harming his roommate. He feels sad, depressed, loss of energy, isolation, worthless, hopeless, poor appetite, sleep and helpless since relocated and not able to take his medication due loss of interest. He has history of 2 past suicide attempts by overdose and has several inpt admissions with [facility], [facility], [place] and [place]. Pt has current  outpatient services with [facility] and [facility] mental health. He was seen [facility] one time for medication management. He denies substance abuse history, however admitted to using crack 3 wks ago.   While a patient in this hospital, Jeffrey [name] was enrolled in group counseling and activities as well as received the following medication Current facility-administered medications:alum & mag hydroxide-simeth (MAALOX/MYLANTA) 200-200-20 MG/5ML suspension 30 mL, 30 mL, Oral, Q4H PRN, Fransisca Kaufmann, NP;  cyclobenzaprine (FLEXERIL) tablet 5 mg, 5 mg, Oral, TID PRN, Nehemiah Settle, MD, 5 mg at 04/01/13 0750;  desvenlafaxine (PRISTIQ) 24 hr tablet 100 mg, 100 mg, Oral, Daily, Nehemiah Settle, MD, 100 mg at 04/05/13 0820 diphenhydrAMINE (BENADRYL) capsule 25 mg, 25 mg, Oral, QHS PRN, Kerry Hough, PA-C, 25 mg at 03/31/13 2141;  gabapentin (NEURONTIN) tablet 800 mg, 800 mg, Oral, TID, Nehemiah Settle, MD, 800 mg at 04/05/13 0820;  hydrOXYzine (ATARAX/VISTARIL) tablet 50 mg, 50 mg, Oral, Q6H PRN, Kerry Hough, PA-C, 50 mg at 04/05/13 7846;  ibuprofen (ADVIL,MOTRIN) tablet 800 mg, 800 mg, Oral, Q8H PRN, Mena Goes Simon, PA-C, 800 mg at 04/01/13 1154 lidocaine (LIDODERM) 5 % 1 patch, 1 patch, Transdermal, Daily, Nehemiah Settle, MD, 1 patch at 04/05/13 9629;  magnesium hydroxide (MILK OF MAGNESIA) suspension 30 mL, 30 mL, Oral, Daily PRN, Fransisca Kaufmann, NP;  mirtazapine (REMERON) tablet 7.5 mg, 7.5 mg, Oral, QHS, Fransisca Kaufmann, NP, 7.5 mg at 04/04/13 2137;  pantoprazole (PROTONIX) EC tablet 40 mg, 40 mg, Oral,  Daily, Nehemiah Settle, MD, 40 mg at 04/05/13 0820 propranolol (INDERAL) tablet 10 mg, 10 mg, Oral, BID, Fransisca Kaufmann, NP, 10 mg at 04/05/13 0820;  simvastatin (ZOCOR) tablet 10 mg, 10 mg, Oral, q1800, Nehemiah Settle, MD, 10 mg at 04/04/13 1650;  traMADol (ULTRAM) tablet 50 mg, 50 mg, Oral, QHS PRN, Fransisca Kaufmann, NP, 50 mg at 04/04/13 2138;  traZODone  (DESYREL) tablet 150 mg, 150 mg, Oral, QHS, Nehemiah Settle, MD, 150 mg at 04/04/13 2137 tuberculin injection 5 Units, 5 Units, Intradermal, Once, Fransisca Kaufmann, NP, 5 Units at 04/04/13 1636  Patient attended treatment team meeting this am and met with treatment team members. Pt symptoms, treatment plan and response to treatment discussed. Jeffrey [name] endorsed that their symptoms have improved. Pt also stated that they are stable for discharge.  In other to control Principal Problem:   MDD (major depressive disorder), severe Active Problems:   Chronic pain syndrome , they will continue psychiatric care on outpatient basis. They will follow-up at  Follow-up Information   Follow up with [facility] On 04/07/2013. (Appointment scheduled at 2:30 pm for hospital discharge appointment.)    Contact information:   [address] [place]. [place], [place] [address] Phone: [phone] Fax: [phone]    .  In addition they were instructed to take all your medications as prescribed by your mental healthcare provider, to report any adverse effects and or reactions from your medicines to your outpatient provider promptly, patient is instructed and cautioned to not engage in alcohol and or illegal drug use while on prescription medicines, in the event of worsening symptoms, patient is instructed to call the crisis hotline, 911 and or go to the nearest ED for appropriate evaluation and treatment of symptoms.   Upon discharge, patient adamantly denies suicidal, homicidal ideations, auditory, visual hallucinations and or delusional thinking. They left [facility] with all personal belongings in no apparent distress.  Consults:  See electronic record for details  Significant Diagnostic Studies:  See electronic record for details  Discharge Vitals:   Blood pressure 128/82, pulse 71, temperature 97.5 F (36.4 C), temperature source Oral, resp. rate 18, height 5\' 10"  (1.778 m), weight 102.059 kg (225 lb), SpO2  97.00%..  Mental Status Exam: See Mental Status Examination and Suicide Risk Assessment completed by Attending Physician prior to discharge.  Discharge destination:  Home  Is patient on multiple antipsychotic therapies at discharge:  No  Has Patient had three or more failed trials of antipsychotic monotherapy by history: N/A Recommended Plan for Multiple Antipsychotic Therapies: N/A    Medication List    STOP taking these medications       Ginkgo Biloba 120 MG Tabs     hydrOXYzine 25 MG capsule  Commonly known as:  VISTARIL      TAKE these medications     Indication   cetirizine 10 MG tablet  Commonly known as:  ZYRTEC  Take 1 tablet (10 mg total) by mouth at bedtime.   Indication:  Hayfever     CoQ10 100 MG Caps  Take 1 capsule by mouth at bedtime.   Indication:  High Blood Pressure     cyclobenzaprine 5 MG tablet  Commonly known as:  FLEXERIL  Take 5 mg by mouth 3 (three) times daily as needed for muscle spasms.      desvenlafaxine 100 MG 24 hr tablet  Commonly known as:  PRISTIQ  Take 1 tablet (100 mg total) by mouth daily. For depression   Indication:  Major Depressive Disorder     diphenhydrAMINE 25 MG tablet  Commonly known as:  BENADRYL  Take 1 tablet (25 mg total) by mouth at bedtime.   Indication:  Trouble Sleeping     esomeprazole 40 MG capsule  Commonly known as:  NEXIUM  Take 1 capsule (40 mg total) by mouth daily before breakfast.   Indication:  Gastroesophageal Reflux Disease with Current Symptoms     gabapentin 800 MG tablet  Commonly known as:  NEURONTIN  Take 1 tablet (800 mg total) by mouth 3 (three) times daily.   Indication:  Neuropathic Pain, Pain     ibuprofen 200 MG tablet  Commonly known as:  ADVIL,MOTRIN  Take 400 mg by mouth every 8 (eight) hours as needed for pain.      lidocaine 5 %  Commonly known as:  LIDODERM  Place 1 patch onto the skin daily. Remove & Discard patch within 12 hours or as directed by MD   Indication:  neck  pain     loperamide 2 MG capsule  Commonly known as:  IMODIUM  Take 2 mg by mouth 4 (four) times daily as needed for diarrhea or loose stools.      mirtazapine 7.5 MG tablet  Commonly known as:  REMERON  Take 1 tablet (7.5 mg total) by mouth at bedtime. For sleep and depression.   Indication:  Trouble Sleeping, Major Depressive Disorder     multivitamin with minerals Tabs  Take 1 tablet by mouth daily.      propranolol 10 MG tablet  Commonly known as:  INDERAL  Take 1 tablet (10 mg total) by mouth daily.   Indication:  High Blood Pressure     simvastatin 10 MG tablet  Commonly known as:  ZOCOR  Take 1 tablet (10 mg total) by mouth at bedtime.   Indication:  Type II A Hyperlipidemia     SUMAtriptan 50 MG tablet  Commonly known as:  IMITREX  Take 1 tablet (50 mg total) by mouth every 2 (two) hours as needed for migraine.   Indication:  Headache     traMADol 50 MG tablet  Commonly known as:  ULTRAM  Take 1 tablet (50 mg total) by mouth at bedtime.   Indication:  Moderate to Moderately Severe Pain     traZODone 150 MG tablet  Commonly known as:  DESYREL  Take 1 tablet (150 mg total) by mouth at bedtime.   Indication:  Trouble Sleeping, Major Depressive Disorder           Follow-up Information   Follow up with [facility] On 04/07/2013. (Appointment scheduled at 2:30 pm for hospital discharge appointment.)    Contact information:   [address] [place]. [place], [place] [address] Phone: [phone] Fax: [phone]     Follow-up recommendations:   Activities: Resume typical activities Diet: Resume typical diet Tests: none Other: Follow up with outpatient provider and report any side effects to out patient prescriber.  Comments:  Take all your medications as prescribed by your mental healthcare provider. Report any adverse effects and or reactions from your medicines to your outpatient provider promptly. Patient is instructed and cautioned to not engage in alcohol and or  illegal drug use while on prescription medicines. In the event of worsening symptoms, patient is instructed to call the crisis hotline, 911 and or go to the nearest ED for appropriate evaluation and treatment of symptoms. Follow-up with your primary care provider for your other medical issues, concerns and or  health care needs.  SignedFransisca Kaufmann NP-C 04/06/13 11:24 AM   Patient is seen and personally evaluated and case discussed with physician extender and developed plan of care. Reviewed the information documented and agree with the treatment plan.  Monnie Anspach,JANARDHAHA R. 04/07/2013 1:50 PM

## 2013-04-05 NOTE — Progress Notes (Signed)
D- Jeffrey [name] is out in the milieu interacting with peers and attending groups.  Rates depression and hopelessness at 4. "Off and on" SI and contracts for safety.  States he has had "a good day".  A- Reenforced appropriate boundaries.  Support and encouragement given. Continue corrent POC and evaluation of treatment goals.  Continue 15' checks for safety. R- Safety maintained.

## 2013-04-05 NOTE — Progress Notes (Addendum)
Recreation Therapy Notes   Date: 07.01.2014 Time: 2:45pm Location: [address] Dayroom        Group Topic/Focus: Musician (AAA/T)  Participation Level: Active  Participation Quality: Appropriate and Attentive  Affect: Euthymic  Cognitive: Appropriate  Additional Comments: 07.01.2014 Session = AAA Session ; Dog Team = [facility] and handler  Patient pet and visited with [place]. Patient smiled while interacting with [place]. Patient asked appropriate questions about [facility] and his training. Patient interacted with peers, LRT and dog team appropriately.   Marykay Lex Victorine Mcnee, LRT/CTRS  Smith Mcnicholas L 04/05/2013, 4:56 PM

## 2013-04-05 NOTE — [facility] Group Notes (Signed)
[facility] LCSW Aftercare Discharge Planning Group Note   04/05/2013 8:45 AM  Participation Quality:  Did Not Attend  Allisson Schindel Horton, LCSWA 04/05/2013 9:57 AM   

## 2013-04-05 NOTE — [facility] Group Notes (Signed)
[facility] LCSW Group Therapy  04/05/2013 1:15 PM   Type of Therapy:  Group Therapy  Participation Level:  Active  Participation Quality:  Appropriate and Attentive  Affect:  Appropriate and Calm  Cognitive:  Alert and Appropriate  Insight:  Developing/Improving and Engaged  Engagement in Therapy:  Developing/Improving and Engaged  Modes of Intervention:  Clarification, Confrontation, Discussion, Education, Exploration, Limit-setting, Orientation, Problem-solving, Rapport Building, Dance movement psychotherapist, Socialization and Support  Summary of Progress/Problems: The topic for group therapy was feelings about diagnosis.  Pt actively participated in group discussion on their past and current diagnosis and how they feel towards this.  Pt also identified how society and family members judge them, based on their diagnosis as well as stereotypes and stigmas.   Pt shared that his diagnosis is MDD and anxiety.  Pt states that he has had depression since 2004 and wishes he would have gotten help sooner.  Pt shared that he understands his symptoms and is able to identify when it is time to reach out and ask for help.  Pt actively participated and was attentive to group discussion.    Jeffrey [name], LCSWA 04/05/2013 2:57 PM

## 2013-04-05 NOTE — [facility] Suicide Risk Assessment (Signed)
Suicide Risk Assessment  Discharge Assessment     Demographic Factors:  Male, Adolescent or young adult, Caucasian, Gay, lesbian, or bisexual orientation and Unemployed  Mental Status Per Nursing Assessment::   On Admission:     Current Mental Status by Physician: Mental Status Examination: Patient appeared as per his stated age, casually dressed, and fairly groomed, and maintaining good eye contact. Patient has good mood and his affect was constricted. He has normal rate, rhythm, and volume of speech. His thought process is linear and goal directed. Patient has denied suicidal, homicidal ideations, intentions or plans. Patient has no evidence of auditory or visual hallucinations, delusions, and paranoia. Patient has fair insight judgment and impulse control.  Loss Factors: Financial problems/change in socioeconomic status  Historical Factors: Prior suicide attempts and Family history of mental illness or substance abuse  Risk Reduction Factors:   Religious beliefs about death, Living with another person, especially a relative, Positive therapeutic relationship and Positive coping skills or problem solving skills  Continued Clinical Symptoms:  Depression:   Recent sense of peace/wellbeing Chronic Pain Previous Psychiatric Diagnoses and Treatments Medical Diagnoses and Treatments/Surgeries  Cognitive Features That Contribute To Risk:  Polarized thinking    Suicide Risk:  Minimal: No identifiable suicidal ideation.  Patients presenting with no risk factors but with morbid ruminations; may be classified as minimal risk based on the severity of the depressive symptoms  Discharge Diagnoses:   AXIS I:  Major Depression, Recurrent severe AXIS II:  Deferred AXIS III:   Past Medical History  Diagnosis Date  . Chronic pain   . Depression   . Anxiety   . Suicidal overdose   . GERD (gastroesophageal reflux disease)    AXIS IV:  economic problems, housing problems, other  psychosocial or environmental problems, problems related to social environment and problems with primary support group AXIS V:  51-60 moderate symptoms  Plan Of Care/Follow-up recommendations:  Activity:  as tolerated Diet:  Regular  Is patient on multiple antipsychotic therapies at discharge:  No   Has Patient had three or more failed trials of antipsychotic monotherapy by history:  No  Recommended Plan for Multiple Antipsychotic Therapies: Not applicable  Angelli Baruch,JANARDHAHA R. 04/05/2013, 12:36 PM

## 2013-04-05 NOTE — Progress Notes (Addendum)
[facility] Adult Case Management Discharge Plan :  Will you be returning to the same living situation after discharge: Yes,  pt initially said he wouldnt go back to the home that was stressful but after realizing the group home wouldn't work out and a shelter was his only other option pt states that he has worked it out with roommate and will be returning home there.   At discharge, do you have transportation home?:Yes,  friend will pick pt up Do you have the ability to pay for your medications:Yes,  access to meds  Release of information consent forms completed and in the chart;  Patient's signature needed at discharge.  Patient to Follow up at: Follow-up Information   Follow up with [facility] On 04/07/2013. (Walk in on this date for hospital discharge appointment.  Walk in clinic is Monday - Friday 8 am - 3 pm.)    Contact information:   [address], [place] [address] Phone: [phone] Fax: [phone]      Patient denies SI/HI:   Yes,  denies SI/HI    Safety Planning and Suicide Prevention discussed:  Yes,  discussed with pt and pt's friend.  See suicide prevention note.   Carmina Miller 04/06/2013 12:15 PM

## 2013-04-06 DIAGNOSIS — F332 Major depressive disorder, recurrent severe without psychotic features: Principal | ICD-10-CM

## 2013-04-06 NOTE — BHH Group Notes (Signed)
[facility] LCSW Aftercare Discharge Planning Group Note   04/06/2013 8:45 AM  Participation Quality:  Alert and Appropriate   Mood/Affect:  Appropriate and Calm  Depression Rating:  6  Anxiety Rating:  6  Thoughts of Suicide:  Pt denies SI/HI  Will you contract for safety?   Yes  Current AVH:  Pt denies   Plan for Discharge/Comments:  Pt attended discharge planning group and actively participated in group.  CSW provided pt with today's workbook.  Pt states that he has been trying to reach his friend at the group home yesterday and today with no luck.  CSW also has left voicemails and have been unable to reach anyone from the group home.  CSW explained that pt would be d/c today and would have to return to his home, although pt states it is stressful, or a shelter.  Pt states that he will call some people today to work on living situation.  Pt has follow up scheduled at [facility] but may have to be scheduled for follow up in [place].  CSW will secure pt's d/c plans.  No further needs voiced by pt at this time.    Transportation Means: Pt has access to transportation  Supports: No supports mentioned at this time  Reyes Ivan, LCSWA 04/06/2013 9:59 AM

## 2013-04-06 NOTE — Progress Notes (Signed)
Adult Psychoeducational Group Note  Date:  04/06/2013 Time:  1:22 PM  Group Topic/Focus:  Personal Choices and Values:   The focus of this group is to help patients assess and explore the importance of values in their lives, how their values affect their decisions, how they express their values and what opposes their expression.  Participation Level:  Minimal  Participation Quality:  Appropriate  Affect:  Flat  Cognitive:  Appropriate  Insight: Appropriate  Engagement in Group:  Limited  Modes of Intervention:  Discussion and Exploration  Additional Comments:  Jeffrey [name] mentioned that his goal is to not hang around the same people when he gets out of [facility]. He also wants to stay on his medication and keep positive influences in his life.   Armel, Rabbani K 04/06/2013, 1:22 PM

## 2013-04-06 NOTE — Progress Notes (Signed)
Discharge Note: Discharge instructions/prescriptions/medication samples given to patient. Patient verbalized understanding of discharge instructions and prescriptions. Returned belongings to patient. Denies SI/HI/AVH. Patient d/c without incident to the front lobby and transported by taxi.

## 2013-04-06 NOTE — [facility] Suicide Risk Assessment (Signed)
Suicide Risk Assessment  Discharge Assessment     Demographic Factors:  Male, Adolescent or young adult, Caucasian, Low socioeconomic status and Unemployed  Mental Status Per Nursing Assessment::   On Admission:     Current Mental Status by Physician: Mental Status Examination: Patient appeared as per his stated age, casually dressed, and fairly groomed, and maintaining good eye contact. Patient has good mood and his affect was constricted. He has normal rate, rhythm, and volume of speech. His thought process is linear and goal directed. Patient has denied suicidal, homicidal ideations, intentions or plans. Patient has no evidence of auditory or visual hallucinations, delusions, and paranoia. Patient has fair insight judgment and impulse control.  Loss Factors: Financial problems/change in socioeconomic status  Historical Factors: Prior suicide attempts and Family history of mental illness or substance abuse  Risk Reduction Factors:   Religious beliefs about death, Living with another person, especially a relative, Positive social support, Positive therapeutic relationship and Positive coping skills or problem solving skills  Continued Clinical Symptoms:  Depression:   Anhedonia Hopelessness Insomnia Recent sense of peace/wellbeing Severe Chronic Pain Previous Psychiatric Diagnoses and Treatments Medical Diagnoses and Treatments/Surgeries  Cognitive Features That Contribute To Risk:  Polarized thinking    Suicide Risk:  Minimal: No identifiable suicidal ideation.  Patients presenting with no risk factors but with morbid ruminations; may be classified as minimal risk based on the severity of the depressive symptoms  Discharge Diagnoses:   AXIS I:  Major Depression, Recurrent severe AXIS II:  Deferred AXIS III:   Past Medical History  Diagnosis Date  . Chronic pain   . Depression   . Anxiety   . Suicidal overdose   . GERD (gastroesophageal reflux disease)    AXIS IV:   economic problems, housing problems, other psychosocial or environmental problems, problems related to social environment and problems with primary support group AXIS V:  51-60 moderate symptoms  Plan Of Care/Follow-up recommendations:  Activity:  as tolerated Diet:  Regular  Is patient on multiple antipsychotic therapies at discharge:  No   Has Patient had three or more failed trials of antipsychotic monotherapy by history:  No  Recommended Plan for Multiple Antipsychotic Therapies: Not applicable  Jeffrey [name],Jeffrey R. 04/06/2013, 12:48 PM

## 2013-04-06 NOTE — Progress Notes (Signed)
Recreation Therapy Notes  Date: 07.02.2014 Time: 3:00pm Location: [address] Day[address]      Group Topic/Focus: Decision Making, Communication  Participation Level: Did not attend.  Marykay Lex Darrill Vreeland, LRT/CTRS  Raliyah Montella L 04/06/2013 4:01 PM

## 2013-04-06 NOTE — Tx Team (Addendum)
Interdisciplinary Treatment Plan Update  Date Reviewed: 04/06/2013   Time Reviewed: 9:45 AM   Progress in Treatment:  Attending groups: Yes  Participating in groups: Yes  Taking medication as prescribed: Yes  Tolerating medication: Yes  Family/Significant other contact made: Yes Patient understands diagnosis: Yes  Discussing patient identified problems/goals with staff: Yes  Medical problems stabilized or resolved: Yes  Denies suicidal/homicidal ideation: Yes Patient has not harmed self or others: Yes   For review of initial/current patient goals, please see plan of care.   Estimated Length of Stay: D/C today  Reasons for Continued Hospitalization: Securing d/c plans  New Problems/Goals identified: N/A  Discharge Plan or Barriers: Pt has follow up scheduled at [facility] for medication management and therapy.    Additional Comments: N/A  Attendees:  Patient:    Signature:    Signature: Dr. Carmelina Dane  04/06/2013 10:22 AM   Signature: Waynetta Sandy, RN  04/06/2013 10:21 AM   Signature: Harold Barban, RN  04/06/2013 10:21 AM   Signature: Burnetta Sabin, RN 04/06/2013 10:21 AM   Signature: Juline Patch, LCSW  04/06/2013 10:21 AM   Signature: Reyes Ivan, LCSW  04/06/2013 10:21 AM   Signature: Maseta Dorley,Care Coordinator  04/06/2013 10:21 AM   Signature: Fransisca Kaufmann, [facility]  04/06/2013 10:21 AM   Signature:    Signature:    Signature:    Scribe for Treatment Team:  Reyes Ivan, LCSWA, 04/06/2013 10:21 AM

## 2013-04-06 NOTE — [facility] Group Notes (Signed)
[facility] LCSW Group Therapy  04/06/2013  1:15 PM   Type of Therapy:  Group Therapy  Participation Level:  Did Not Attend  Reyes Ivan, LCSWA 04/06/2013 2:33 PM

## 2013-04-08 ENCOUNTER — Encounter (HOSPITAL_COMMUNITY): Payer: Self-pay | Admitting: *Deleted

## 2013-04-08 ENCOUNTER — Emergency Department (HOSPITAL_COMMUNITY)
Admission: EM | Admit: 2013-04-08 | Discharge: 2013-04-12 | Disposition: A | Discharge status: Home / Self Care | Payer: Medicaid Other | Attending: Emergency Medicine | Admitting: Emergency Medicine

## 2013-04-08 DIAGNOSIS — F329 Major depressive disorder, single episode, unspecified: Secondary | ICD-10-CM | POA: Insufficient documentation

## 2013-04-08 DIAGNOSIS — R45851 Suicidal ideations: Secondary | ICD-10-CM | POA: Insufficient documentation

## 2013-04-08 DIAGNOSIS — F322 Major depressive disorder, single episode, severe without psychotic features: Secondary | ICD-10-CM

## 2013-04-08 DIAGNOSIS — Z79899 Other long term (current) drug therapy: Secondary | ICD-10-CM | POA: Insufficient documentation

## 2013-04-08 DIAGNOSIS — F411 Generalized anxiety disorder: Secondary | ICD-10-CM | POA: Insufficient documentation

## 2013-04-08 DIAGNOSIS — R45850 Homicidal ideations: Secondary | ICD-10-CM | POA: Insufficient documentation

## 2013-04-08 DIAGNOSIS — F17200 Nicotine dependence, unspecified, uncomplicated: Secondary | ICD-10-CM | POA: Insufficient documentation

## 2013-04-08 DIAGNOSIS — F3289 Other specified depressive episodes: Secondary | ICD-10-CM | POA: Insufficient documentation

## 2013-04-08 DIAGNOSIS — K219 Gastro-esophageal reflux disease without esophagitis: Secondary | ICD-10-CM | POA: Insufficient documentation

## 2013-04-08 LAB — COMPREHENSIVE METABOLIC PANEL
ALT: 29 U/L (ref 0–53)
AST: 32 U/L (ref 0–37)
Alkaline Phosphatase: 70 U/L (ref 39–117)
CO2: 26 mEq/L (ref 19–32)
GFR calc Af Amer: 83 mL/min — ABNORMAL LOW (ref >90)
Glucose, Bld: 99 mg/dL (ref 70–99)
Potassium: 4.1 mEq/L (ref 3.5–5.1)
Sodium: 138 mEq/L (ref 135–145)
Total Protein: 7.7 g/dL (ref 6.0–8.3)

## 2013-04-08 LAB — CBC
Hemoglobin: 15.9 g/dL (ref 13.0–17.0)
MCHC: 34.1 g/dL (ref 30.0–36.0)
Platelets: 188 10*3/uL (ref 150–400)
RBC: 5.21 MIL/uL (ref 4.22–5.81)

## 2013-04-08 LAB — RAPID URINE DRUG SCREEN, HOSP PERFORMED
Amphetamines: NOT DETECTED
Barbiturates: NOT DETECTED
Benzodiazepines: NOT DETECTED
Cocaine: POSITIVE — AB
Tetrahydrocannabinol: NOT DETECTED

## 2013-04-08 MED ORDER — PANTOPRAZOLE SODIUM 40 MG PO TBEC
80.0000 mg | DELAYED_RELEASE_TABLET | Freq: Every day | ORAL | Status: DC
  Administered 2013-04-09 – 2013-04-11 (×3): 80 mg via ORAL
  Filled 2013-04-08: qty 1
  Filled 2013-04-08 (×2): qty 2
  Filled 2013-04-08: qty 1

## 2013-04-08 MED ORDER — GABAPENTIN 400 MG PO CAPS
800.0000 mg | ORAL_CAPSULE | Freq: Three times a day (TID) | ORAL | Status: DC
  Administered 2013-04-08 – 2013-04-12 (×12): 800 mg via ORAL
  Filled 2013-04-08 (×15): qty 2

## 2013-04-08 MED ORDER — PROPRANOLOL HCL 10 MG PO TABS
10.0000 mg | ORAL_TABLET | Freq: Every day | ORAL | Status: DC
  Administered 2013-04-09 – 2013-04-12 (×4): 10 mg via ORAL
  Filled 2013-04-08 (×4): qty 1

## 2013-04-08 MED ORDER — TRAZODONE HCL 50 MG PO TABS
150.0000 mg | ORAL_TABLET | Freq: Every day | ORAL | Status: DC
  Administered 2013-04-08 – 2013-04-11 (×4): 150 mg via ORAL
  Filled 2013-04-08 (×4): qty 1

## 2013-04-08 MED ORDER — DIPHENHYDRAMINE HCL 25 MG PO CAPS
25.0000 mg | ORAL_CAPSULE | Freq: Every day | ORAL | Status: DC
  Administered 2013-04-08 – 2013-04-11 (×4): 25 mg via ORAL
  Filled 2013-04-08 (×4): qty 1

## 2013-04-08 MED ORDER — NICOTINE 21 MG/24HR TD PT24
21.0000 mg | MEDICATED_PATCH | Freq: Every day | TRANSDERMAL | Status: DC
  Filled 2013-04-08 (×4): qty 1

## 2013-04-08 MED ORDER — LORATADINE 10 MG PO TABS
10.0000 mg | ORAL_TABLET | Freq: Every day | ORAL | Status: DC
  Administered 2013-04-08 – 2013-04-12 (×5): 10 mg via ORAL
  Filled 2013-04-08 (×5): qty 1

## 2013-04-08 MED ORDER — IBUPROFEN 200 MG PO TABS: 600.0000 mg | ORAL_TABLET | Freq: Three times a day (TID) | ORAL | Status: DC | PRN

## 2013-04-08 MED ORDER — ONDANSETRON HCL 4 MG PO TABS: 4.0000 mg | ORAL_TABLET | Freq: Three times a day (TID) | ORAL | Status: DC | PRN

## 2013-04-08 MED ORDER — LIDOCAINE 5 % EX PTCH
1.0000 | MEDICATED_PATCH | CUTANEOUS | Status: DC
  Administered 2013-04-08 – 2013-04-11 (×4): 1 via TRANSDERMAL
  Filled 2013-04-08 (×5): qty 1

## 2013-04-08 MED ORDER — ADULT MULTIVITAMIN W/MINERALS CH
1.0000 | ORAL_TABLET | Freq: Every day | ORAL | Status: DC
  Administered 2013-04-08 – 2013-04-12 (×5): 1 via ORAL
  Filled 2013-04-08 (×5): qty 1

## 2013-04-08 MED ORDER — ZOLPIDEM TARTRATE 5 MG PO TABS: 5.0000 mg | ORAL_TABLET | Freq: Every evening | ORAL | Status: DC | PRN

## 2013-04-08 MED ORDER — DIPHENHYDRAMINE HCL 25 MG PO TABS
25.0000 mg | ORAL_TABLET | Freq: Every day | ORAL | Status: DC
  Filled 2013-04-08: qty 1

## 2013-04-08 MED ORDER — VENLAFAXINE HCL ER 150 MG PO CP24
150.0000 mg | ORAL_CAPSULE | Freq: Every day | ORAL | Status: DC
  Filled 2013-04-08 (×2): qty 1

## 2013-04-08 MED ORDER — ALUM & MAG HYDROXIDE-SIMETH 200-200-20 MG/5ML PO SUSP: 30.0000 mL | ORAL | Status: DC | PRN

## 2013-04-08 NOTE — Consult Note (Signed)
Reason for Consult Pt presennted to ed with Identical complaints from 03/31/2013-(see Notes/Dr Jeffrey [name])  Referring Physician: Dr Jeffrey [name] ED MD  Jeffrey [name] is an 52 y.o. male.  HPI: Pt back c/o of his living arrangements/roomate-Also admits he has never been treated for his crack cocaine dependency although he has been at [name] under the care of Dr Jeffrey [name] in past( 2009)he does say he has been trying to get in touch with a Mrs Jeffrey [name] to change his living Jeffrey [name] apparently has other houses he can reside at.Pt admits his cocaine use is interfering with his life as well.He admits he was just discharged and feels alternate living arrangements and treatment for his cocaine dependency are really what he requires at this time.He voices no homicidal or suicidal ideas/thoughts at this time.  Past Medical History  Diagnosis Date  . Chronic pain   . Depression   . Anxiety   . Suicidal overdose   . GERD (gastroesophageal reflux disease)     Past Surgical History  Procedure Laterality Date  . Ankle surgery    . Hernia repair    . Neck fusion      History reviewed. No pertinent family history.  Social History:  reports that he has been smoking Cigarettes.  He has been smoking about 1.50 packs per day. He does not have any smokeless tobacco history on file. He reports that he uses illicit drugs ("Crack" cocaine). He reports that he does not drink alcohol.  Allergies:  Allergies  Allergen Reactions  . Abilify (Aripiprazole) Swelling  . Ativan (Lorazepam)     Extreme nervousness  . Celexa (Citalopram)     Nervousness  . Effexor (Venlafaxine)     Trouble urinating  . Levaquin (Levofloxacin) Diarrhea  . Lexapro (Escitalopram Oxalate)   . Lipitor (Atorvastatin)     Muscle weakness  . Prozac (Fluoxetine)     Difficulty walking  . Toradol (Ketorolac Tromethamine) Nausea And Vomiting  . Tylenol (Acetaminophen)     GI Bleeds, tolerates ibuprofen  . Valium (Diazepam)     Nervousness     Medications: Prior to Admission:  (Not in a hospital admission)  Results for orders placed during the hospital encounter of 04/08/13 (from the past 48 hour(s))  URINE RAPID DRUG SCREEN (HOSP PERFORMED)     Status: Abnormal   Collection Time    04/08/13  3:01 PM      Result Value Range   Opiates NONE DETECTED  NONE DETECTED   Cocaine POSITIVE (*) NONE DETECTED   Benzodiazepines NONE DETECTED  NONE DETECTED   Amphetamines NONE DETECTED  NONE DETECTED   Tetrahydrocannabinol NONE DETECTED  NONE DETECTED   Barbiturates NONE DETECTED  NONE DETECTED   Comment:            DRUG SCREEN FOR MEDICAL PURPOSES     ONLY.  IF CONFIRMATION IS NEEDED     FOR ANY PURPOSE, NOTIFY LAB     WITHIN 5 DAYS.                LOWEST DETECTABLE LIMITS     FOR URINE DRUG SCREEN     Drug Class       Cutoff (ng/mL)     Amphetamine      1000     Barbiturate      200     Benzodiazepine   200     Tricyclics       300     Opiates  300     Cocaine          300     THC              50  CBC     Status: None   Collection Time    04/08/13  3:18 PM      Result Value Range   WBC 5.7  4.0 - 10.5 K/uL   RBC 5.21  4.22 - 5.81 MIL/uL   Hemoglobin 15.9  13.0 - 17.0 g/dL   HCT 45.4  09.8 - 11.9 %   MCV 89.4  78.0 - 100.0 fL   MCH 30.5  26.0 - 34.0 pg   MCHC 34.1  30.0 - 36.0 g/dL   RDW 14.7  82.9 - 56.2 %   Platelets 188  150 - 400 K/uL  COMPREHENSIVE METABOLIC PANEL     Status: Abnormal   Collection Time    04/08/13  3:18 PM      Result Value Range   Sodium 138  135 - 145 mEq/L   Potassium 4.1  3.5 - 5.1 mEq/L   Chloride 103  96 - 112 mEq/L   CO2 26  19 - 32 mEq/L   Glucose, Bld 99  70 - 99 mg/dL   BUN 17  6 - 23 mg/dL   Creatinine, Ser 1.30  0.50 - 1.35 mg/dL   Calcium 9.7  8.4 - 86.5 mg/dL   Total Protein 7.7  6.0 - 8.3 g/dL   Albumin 3.9  3.5 - 5.2 g/dL   AST 32  0 - 37 U/L   ALT 29  0 - 53 U/L   Alkaline Phosphatase 70  39 - 117 U/L   Total Bilirubin 0.4  0.3 - 1.2 mg/dL   GFR calc non  Af Amer 72 (*) >90 mL/min   GFR calc Af Amer 83 (*) >90 mL/min   Comment:            The eGFR has been calculated     using the CKD EPI equation.     This calculation has not been     validated in all clinical     situations.     eGFR's persistently     <90 mL/min signify     possible Chronic Kidney Disease.  Jeffrey [name]     Status: None   Collection Time    04/08/13  3:18 PM      Result Value Range   Alcohol, Ethyl (B) <11  0 - 11 mg/dL   Comment:            LOWEST DETECTABLE LIMIT FOR     SERUM ALCOHOL IS 11 mg/dL     FOR MEDICAL PURPOSES ONLY    No results found.  Review of Systems  Constitutional: Negative.   HENT: Negative.   Eyes: Negative.   Respiratory: Negative.   Cardiovascular: Negative.   Gastrointestinal: Negative.   Genitourinary: Negative.   Musculoskeletal: Negative.   Skin: Negative.   Neurological: Negative.   Endo/Heme/Allergies: Negative.   Psychiatric/Behavioral: Positive for depression and substance abuse. Negative for hallucinations and memory loss. The patient is nervous/anxious. The patient does not have insomnia.    Blood pressure 117/77, pulse 91, temperature 99.4 F (37.4 C), temperature source Oral, resp. rate 20, SpO2 95.00%. Physical Exam  Constitutional: He is oriented to person, place, and time. He appears well-developed and well-nourished.  HENT:  Head: Normocephalic and atraumatic.  Right Ear: External ear normal.  Left  Ear: External ear normal.  Eyes: Conjunctivae and EOM are normal. Pupils are equal, round, and reactive to light. Right eye exhibits no discharge. Left eye exhibits no discharge. No scleral icterus.  Neck:  S/P cervical fusion  Cardiovascular: Normal rate and regular rhythm.   Respiratory: Effort normal and breath sounds normal. No respiratory distress. He has no wheezes.  GI:  Deferred  Genitourinary:  Deferred  Musculoskeletal: Normal range of motion.  Lying in bed  Neurological: He is alert and oriented to  person, place, and time. No cranial nerve deficit.  Skin: Skin is warm and dry.  Psychiatric:  Behavior-WNL/comprehensible Orientation-X3 Affect-Quite serious/troubled appearing/does not smile Perception- Limited Judgement-Impaired by cocaine dependency Speech-Normal rate/tone subdued Thought-has been trying to get moved but couldn't get                      thru to Mrs [place] whom he states has other                       possible living arrangements                                                Cocaine use troubling to him-admits he needs                 help     Assessment/Plan:AXIS I: Cocaine dependency;MDD; Hx suicide attempts;Anxiety DO NOS;Nicotene Dependence                               AXIS ZO:[id number]                               Axis III: GERD/Chronic Pain/S/P neck and ankle surgeries                               Axis IV: Living, social,economic problems (?to what extent cocaine related)                               Axis V: GAF 60  Plan: Ask ACT/SW to contact [facility] [phone] to see if pt can move and assist with getting treatment for his cocaine addiction (?[facility]).Do not recommend readmission at this time.                                                          Court Joy 04/08/2013, 11:42 PM

## 2013-04-08 NOTE — ED Notes (Signed)
Pt calm and cooperative; pt reports having SI and HI ideations.  Pt agreed to not act on ideas and receive treatment at this time.  Sitter at bedside.  RN continuing to monitor.
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
## 2013-04-08 NOTE — ED Notes (Signed)
Pt states "I'm extremely suicidal and homicidal against my roommate." pt has hx of depression/anxiety. Pt has multiple plans on how he would commit suicide.
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
## 2013-04-08 NOTE — BH Assessment (Addendum)
Assessment Note   Jeffrey [name] is an 52 y.o. male. Pt presents with C/O  suicidal ideations with a plan to overdose on his medications,ride scooter into traffic,and jump off of a bridge. Pt endorse's Homicidal ideations towards his roommate. Pt reports recent Crack/Cocaine use earlier this week. Pt reports that he smoked $200 worth of crack/cocaine on 04/05/13, pt reports that he has just recently started using crack/cocaine.  Pt reports that his roommate has been off of his psych medications for the past 5 days and has been drinking etoh. Pt reports that he does not feel safe living with his roommate due to his roommates erratic behaviors. Pt reports that his roommate is a totally different person when he drinks etoh and  He reports that his roommate has recently came into his bedroom unannounced barking like a dog. Pt reports that he feels threatend  By his roommates behaviors. Pt reports that he is feeling homicidal toward his roommate and states that if his roommate continues to act the way he does that he will stab him with a kitchen knife. Pt is unable to reliably contract for safety at this time. Consulted with Dr. Margarita Grizzle EDP  and Maryjean Morn physician extender about pt's clinical presentation and disposition. Maryjean Morn agreed that he would follow-up with patient when making his nightly rounds. Pt referred to [facility] at this time for inpatient treatment.   Axis I: Mood Disorder NOS Axis II: Deferred Axis III:  Past Medical History  Diagnosis Date  . Chronic pain   . Depression   . Anxiety   . Suicidal overdose   . GERD (gastroesophageal reflux disease)    Axis IV: other psychosocial or environmental problems and problems related to social environment Axis V: 31-40 impairment in reality testing  Past Medical History:  Past Medical History  Diagnosis Date  . Chronic pain   . Depression   . Anxiety   . Suicidal overdose   . GERD (gastroesophageal reflux disease)     Past  Surgical History  Procedure Laterality Date  . Ankle surgery    . Hernia repair    . Neck fusion      Family History: History reviewed. No pertinent family history.  Social History:  reports that he has been smoking Cigarettes.  He has been smoking about 1.50 packs per day. He does not have any smokeless tobacco history on file. He reports that he uses illicit drugs ("Crack" cocaine). He reports that he does not drink alcohol.  Additional Social History:  Alcohol / Drug Use History of alcohol / drug use?: Yes Substance #1 Name of Substance 1:  (Etoh) 1 - Age of First Use:  (14) 1 - Amount (size/oz):  (Denies current use) 1 - Frequency:  (denies current use) 1 - Duration:  (denies current use) 1 - Last Use / Amount:  (2003) Substance #2 Name of Substance 2:  (Crack/Cocaine) 2 - Age of First Use:  (31) 2 - Amount (size/oz):  (denies using regularly-minimizing) 2 - Frequency:  (pt reports that he does not use that often) 2 - Duration:  (pt reports that he just started back using this past Tuesday) 2 - Last Use / Amount:  (7/1/1/4-$200 worth)  CIWA: CIWA-Ar BP: 117/77 mmHg Pulse Rate: 91 COWS:    Allergies:  Allergies  Allergen Reactions  . Abilify (Aripiprazole) Swelling  . Ativan (Lorazepam)     Extreme nervousness  . Celexa (Citalopram)     Nervousness  . Effexor (Venlafaxine)  Trouble urinating  . Levaquin (Levofloxacin) Diarrhea  . Lexapro (Escitalopram Oxalate)   . Lipitor (Atorvastatin)     Muscle weakness  . Prozac (Fluoxetine)     Difficulty walking  . Toradol (Ketorolac Tromethamine) Nausea And Vomiting  . Tylenol (Acetaminophen)     GI Bleeds, tolerates ibuprofen  . Valium (Diazepam)     Nervousness    Home Medications:  (Not in a hospital admission)  OB/GYN Status:  No LMP for male patient.  General Assessment Data Location of Assessment: [facility] ED ACT Assessment: Yes Living Arrangements: Other (Comment) (Lives with roommate) Can pt return to  current living arrangement?: Yes Admission Status: Voluntary Is patient capable of signing voluntary admission?: Yes Transfer from: Home Referral Source: Other ([facility])     Risk to self Suicidal Ideation: Yes-Currently Present Suicidal Intent: Yes-Currently Present Is patient at risk for suicide?: Yes Suicidal Plan?: Yes-Currently Present Specify Current Suicidal Plan: overdose on meds, jump off of bridge,drive scooter into traffic Access to Means: Yes Specify Access to Suicidal Means: access to highways,traffic and rx medications What has been your use of drugs/alcohol within the last 12 months?: Crack/Cocaine within the past week Previous Attempts/Gestures: Yes How many times?: 2 Other Self Harm Risks: none reported Triggers for Past Attempts: Other (Comment) (previous triggers-being homeless) Intentional Self Injurious Behavior: None Family Suicide History: No Recent stressful life event(s): Conflict (Comment) (Conflict with roommate) Persecutory voices/beliefs?: No Depression: Yes (Pt reports feelings of hopelessness) Substance abuse history and/or treatment for substance abuse?: Yes (Cocaine) Suicide prevention information given to non-admitted patients: Not applicable  Risk to Others Homicidal Ideation: Yes-Currently Present Thoughts of Harm to Others: Yes-Currently Present Comment - Thoughts of Harm to Others: pt reports thoughts of killing his roommate Current Homicidal Intent: Yes-Currently Present Current Homicidal Plan: Yes-Currently Present Describe Current Homicidal Plan: pt reports a plan to stab his roommate with his knife if he keeps acting the way he does Access to Homicidal Means: Yes Describe Access to Homicidal Means: access to kitchen knives Identified Victim: roommate History of harm to others?: No Assessment of Violence: None Noted Violent Behavior Description: None noted Does patient have access to weapons?: Yes (Comment) (pt reports that he has access  to [employer]) Criminal Charges Pending?: No Does patient have a court date: No  Psychosis Hallucinations: None noted Delusions: None noted  Mental Status Report Appear/Hygiene: Other (Comment) (Appropriate) Eye Contact: Fair Motor Activity: Freedom of movement Speech: Logical/coherent Level of Consciousness: Alert Mood: Anxious Affect: Appropriate to circumstance Anxiety Level: Minimal Thought Processes: Coherent Judgement: Impaired Orientation: Person;Place;Time;Situation Obsessive Compulsive Thoughts/Behaviors: None  Cognitive Functioning Concentration: Normal Memory: Recent Intact;Remote Intact IQ: Average Insight: Fair Impulse Control: Fair Appetite: Good Weight Loss: 0 Weight Gain: 0 Sleep: Decreased (has sleep on average 3 hrs nightly d/t roommates erratic beh) Total Hours of Sleep:  (3hrs per night over the past week) Vegetative Symptoms: Staying in bed  ADLScreening [facility] Assessment Services) Patient's cognitive ability adequate to safely complete daily activities?: No Patient able to express need for assistance with ADLs?: No Independently performs ADLs?: Yes (appropriate for developmental age)  Abuse/Neglect [facility]) Physical Abuse: Denies Verbal Abuse: Denies Sexual Abuse: Denies  Prior Inpatient Therapy Prior Inpatient Therapy: Yes Prior Therapy Dates: several years  Prior Therapy Facilty/Provider(s): [facility] Reason for Treatment: Suicidal Ideations  Prior Outpatient Therapy Prior Outpatient Therapy: Yes Prior Therapy Dates: Has been to [facility] 1x in 03/2013, [facility] in [place] Prior Therapy Facilty/Provider(s): [facility] Reason for Treatment: Medication Management/Psychiatry  ADL Screening (condition at time of admission) Patient's cognitive ability adequate to safely complete daily activities?: No Is the patient deaf or have difficulty hearing?: No Does the patient have difficulty seeing, even when  wearing glasses/contacts?: No Does the patient have difficulty concentrating, remembering, or making decisions?: No Patient able to express need for assistance with ADLs?: No Does the patient have difficulty dressing or bathing?: No Independently performs ADLs?: Yes (appropriate for developmental age) Does the patient have difficulty walking or climbing stairs?: No Weakness of Legs: None Weakness of Arms/Hands: None  Home Assistive Devices/Equipment Home Assistive Devices/Equipment: None    Abuse/Neglect Assessment (Assessment to be complete while patient is alone) Physical Abuse: Denies Verbal Abuse: Denies Sexual Abuse: Denies Exploitation of patient/patient's resources: Denies Self-Neglect: Denies Values / Beliefs Cultural Requests During Hospitalization: None Spiritual Requests During Hospitalization: None   Advance Directives (For Healthcare) Advance Directive: Patient does not have advance directive;Patient would not like information    Additional Information 1:1 In Past 12 Months?: No CIRT Risk: No Elopement Risk: No Does patient have medical clearance?: Yes     Disposition:  Disposition Initial Assessment Completed for this Encounter: Yes Disposition of Patient: Referred to (Referred to [facility]) Type of inpatient treatment program: Adult  On Site Evaluation by:   Reviewed with Physician:     Bjorn Pippin 04/08/2013 10:14 PM

## 2013-04-09 DIAGNOSIS — F411 Generalized anxiety disorder: Secondary | ICD-10-CM

## 2013-04-09 MED ORDER — DESVENLAFAXINE SUCCINATE ER 100 MG PO TB24
100.0000 mg | ORAL_TABLET | Freq: Every day | ORAL | Status: DC
  Administered 2013-04-09 – 2013-04-12 (×4): 100 mg via ORAL
  Filled 2013-04-09 (×4): qty 1

## 2013-04-09 NOTE — ED Notes (Signed)
[name] np into see 

## 2013-04-09 NOTE — Progress Notes (Signed)
[facility] MD Progress Note  04/09/2013 1:46 PM Jeffrey [name]  MRN:  [id number] Subjective:  Patient during this encounter is still unable to contract for safety.  He is still suicidal and plans to jump off the bridge or jump on to a moving car.  He reports poor sleep and poor appetite and stated he still need treatment for his cocaine addiction.  Patient was sleeping when this writer woke him up.  He them closed his eyes during the interview and did not look up.  He reports feeling depressed and helpless.  We will keep in the [facility] till appropriate disposition can be determined.  Meanwhile patient is offered his medications and his other needs are met. Diagnosis:   Axis I: Anxiety Disorder NOS and Major Depression, Recurrent severe Axis II: Deferred Axis III:  Past Medical History  Diagnosis Date  . Chronic pain   . Depression   . Anxiety   . Suicidal overdose   . GERD (gastroesophageal reflux disease)    Axis IV: housing problems, other psychosocial or environmental problems and problems related to social environment Axis V: 51-60 moderate symptoms  ADL's:  Intact  Sleep: Poor  Appetite:  Poor  Suicidal Ideation:  Plan:  jump off the bridge or moving car Intent:  yes Means:  jump off a brigde or jump onto a moving vehicle Homicidal Ideation:  Plan:  kill his roomate Intent:  yes Means:  Stab him with a kitchen knife AEB (as evidenced by):  Psychiatric Specialty Exam: Review of Systems  Constitutional: Negative.   HENT: Negative.   Eyes: Negative.   Respiratory: Negative.   Cardiovascular: Negative.   Gastrointestinal: Negative.   Genitourinary: Negative.   Musculoskeletal: Negative.   Skin: Negative.   Neurological: Negative.   Endo/Heme/Allergies: Negative.   Psychiatric/Behavioral: Positive for depression (Rates 8/10), suicidal ideas (Unable to contract for safety.  Reports he plans to jump into traffic or jump off the bridge.) and substance abuse (Want treatment for  cocain). Negative for hallucinations and memory loss. The patient is nervous/anxious (Anxious because of his living arrangement.  He reports not comfortable with roommate.) and has insomnia (Reports poor sleep).     Blood pressure 125/85, pulse 91, temperature 98.5 F (36.9 C), temperature source Oral, resp. rate 15, SpO2 91.00%.There is no weight on file to calculate BMI.  General Appearance: Casual  Eye Contact::  None  Speech:  Slow  Volume:  Decreased  Mood:  Anxious and Depressed  Affect:  Blunt and Depressed  Thought Process:  Linear  Orientation:  Full (Time, Place, and Person)  Thought Content:  WDL  Suicidal Thoughts:  Yes.  with intent/plan  Homicidal Thoughts:  Yes.  without intent/plan  Memory:  Immediate;   Good Recent;   Good Remote;   Good  Judgement:  Poor  Insight:  Lacking  Psychomotor Activity:  Decreased  Concentration:  Good  Recall:  Good  Akathisia:  NA  Handed:  Right  AIMS (if indicated):     Assets:  Desire for Improvement  Sleep:      Current Medications: Current Facility-Administered Medications  Medication Dose Route Frequency Provider Last Rate Last Dose  . alum & mag hydroxide-simeth (MAALOX/MYLANTA) 200-200-20 MG/5ML suspension 30 mL  30 mL Oral PRN Ethelda Chick, MD      . diphenhydrAMINE (BENADRYL) capsule 25 mg  25 mg Oral QHS Gwen Her, RPH   25 mg at 04/08/13 2245  . gabapentin (NEURONTIN) capsule 800 mg  800 mg  Oral TID Ethelda Chick, MD   800 mg at 04/09/13 1004  . ibuprofen (ADVIL,MOTRIN) tablet 600 mg  600 mg Oral Q8H PRN Ethelda Chick, MD      . lidocaine (LIDODERM) 5 % 1 patch  1 patch Transdermal Q24H Ethelda Chick, MD   1 patch at 04/08/13 1813  . loratadine (CLARITIN) tablet 10 mg  10 mg Oral Daily Ethelda Chick, MD   10 mg at 04/09/13 1004  . multivitamin with minerals tablet 1 tablet  1 tablet Oral Daily Ethelda Chick, MD   1 tablet at 04/09/13 1004  . nicotine (NICODERM CQ - dosed in mg/24 hours) patch 21 mg   21 mg Transdermal Daily Ethelda Chick, MD      . ondansetron [facility]) tablet 4 mg  4 mg Oral Q8H PRN Ethelda Chick, MD      . pantoprazole (PROTONIX) EC tablet 80 mg  80 mg Oral Q1200 Ethelda Chick, MD   80 mg at 04/09/13 1218  . propranolol (INDERAL) tablet 10 mg  10 mg Oral Daily Ethelda Chick, MD   10 mg at 04/09/13 1004  . traZODone (DESYREL) tablet 150 mg  150 mg Oral QHS Ethelda Chick, MD   150 mg at 04/08/13 2244  . venlafaxine XR (EFFEXOR-XR) 24 hr capsule 150 mg  150 mg Oral Q breakfast Ethelda Chick, MD      . zolpidem (AMBIEN) tablet 5 mg  5 mg Oral QHS PRN Ethelda Chick, MD       Current Outpatient Prescriptions  Medication Sig Dispense Refill  . cetirizine (ZYRTEC) 10 MG tablet Take 1 tablet (10 mg total) by mouth at bedtime.      . Coenzyme Q10 (COQ10) 100 MG CAPS Take 1 capsule by mouth at bedtime.  30 each    . cyclobenzaprine (FLEXERIL) 5 MG tablet Take 5 mg by mouth 3 (three) times daily as needed for muscle spasms.      Marland Kitchen desvenlafaxine (PRISTIQ) 100 MG 24 hr tablet Take 1 tablet (100 mg total) by mouth daily. For depression  30 tablet  0  . diphenhydrAMINE (BENADRYL) 25 MG tablet Take 1 tablet (25 mg total) by mouth at bedtime.  30 tablet  0  . esomeprazole (NEXIUM) 40 MG capsule Take 1 capsule (40 mg total) by mouth daily before breakfast.      . gabapentin (NEURONTIN) 800 MG tablet Take 1 tablet (800 mg total) by mouth 3 (three) times daily.  90 tablet  0  . lidocaine (LIDODERM) 5 % Place 1 patch onto the skin daily. Remove & Discard patch within 12 hours or as directed by MD  30 patch  0  . loperamide (IMODIUM) 2 MG capsule Take 2 mg by mouth 4 (four) times daily as needed for diarrhea or loose stools.      . Multiple Vitamin (MULTIVITAMIN WITH MINERALS) TABS Take 1 tablet by mouth daily.      . propranolol (INDERAL) 10 MG tablet Take 1 tablet (10 mg total) by mouth daily.      . simvastatin (ZOCOR) 10 MG tablet Take 1 tablet (10 mg total) by mouth at  bedtime.      . SUMAtriptan (IMITREX) 50 MG tablet Take 1 tablet (50 mg total) by mouth every 2 (two) hours as needed for migraine.  10 tablet  0  . traMADol (ULTRAM) 50 MG tablet Take 1 tablet (50 mg total) by mouth at bedtime.  30 tablet    . traZODone (DESYREL) 150 MG tablet Take 1 tablet (150 mg total) by mouth at bedtime.  30 tablet  0    Lab Results:  Results for orders placed during the hospital encounter of 04/08/13 (from the past 48 hour(s))  URINE RAPID DRUG SCREEN (HOSP PERFORMED)     Status: Abnormal   Collection Time    04/08/13  3:01 PM      Result Value Range   Opiates NONE DETECTED  NONE DETECTED   Cocaine POSITIVE (*) NONE DETECTED   Benzodiazepines NONE DETECTED  NONE DETECTED   Amphetamines NONE DETECTED  NONE DETECTED   Tetrahydrocannabinol NONE DETECTED  NONE DETECTED   Barbiturates NONE DETECTED  NONE DETECTED   Comment:            DRUG SCREEN FOR MEDICAL PURPOSES     ONLY.  IF CONFIRMATION IS NEEDED     FOR ANY PURPOSE, NOTIFY LAB     WITHIN 5 DAYS.                LOWEST DETECTABLE LIMITS     FOR URINE DRUG SCREEN     Drug Class       Cutoff (ng/mL)     Amphetamine      1000     Barbiturate      200     Benzodiazepine   200     Tricyclics       300     Opiates          300     Cocaine          300     THC              50  CBC     Status: None   Collection Time    04/08/13  3:18 PM      Result Value Range   WBC 5.7  4.0 - 10.5 K/uL   RBC 5.21  4.22 - 5.81 MIL/uL   Hemoglobin 15.9  13.0 - 17.0 g/dL   HCT 45.4  09.8 - 11.9 %   MCV 89.4  78.0 - 100.0 fL   MCH 30.5  26.0 - 34.0 pg   MCHC 34.1  30.0 - 36.0 g/dL   RDW 14.7  82.9 - 56.2 %   Platelets 188  150 - 400 K/uL  COMPREHENSIVE METABOLIC PANEL     Status: Abnormal   Collection Time    04/08/13  3:18 PM      Result Value Range   Sodium 138  135 - 145 mEq/L   Potassium 4.1  3.5 - 5.1 mEq/L   Chloride 103  96 - 112 mEq/L   CO2 26  19 - 32 mEq/L   Glucose, Bld 99  70 - 99 mg/dL   BUN 17  6 -  23 mg/dL   Creatinine, Ser 1.30  0.50 - 1.35 mg/dL   Calcium 9.7  8.4 - 86.5 mg/dL   Total Protein 7.7  6.0 - 8.3 g/dL   Albumin 3.9  3.5 - 5.2 g/dL   AST 32  0 - 37 U/L   ALT 29  0 - 53 U/L   Alkaline Phosphatase 70  39 - 117 U/L   Total Bilirubin 0.4  0.3 - 1.2 mg/dL   GFR calc non Af Amer 72 (*) >90 mL/min   GFR calc Af Amer 83 (*) >90 mL/min   Comment:  The eGFR has been calculated     using the CKD EPI equation.     This calculation has not been     validated in all clinical     situations.     eGFR's persistently     <90 mL/min signify     possible Chronic Kidney Disease.  ETHANOL     Status: None   Collection Time    04/08/13  3:18 PM      Result Value Range   Alcohol, Ethyl (B) <11  0 - 11 mg/dL   Comment:            LOWEST DETECTABLE LIMIT FOR     SERUM ALCOHOL IS 11 mg/dL     FOR MEDICAL PURPOSES ONLY    Physical Findings: AIMS:  , ,  ,  ,    CIWA:    COWS:     Treatment Plan Summary: Daily contact with patient to assess and evaluate symptoms and progress in treatment Medication management  Plan:  We will keep here until appropriate disposition is determined. SW involvement is needed. Continue care and administer his discharged medications.  Medical Decision Making Problem Points:  Review of psycho-social stressors (1) Data Points:  Review and summation of old records (2)  I certify that inpatient services furnished can reasonably be expected to improve the patient's condition.   Dahlia Byes, C  PMHNP-BC 04/09/2013, 1:46 PM

## 2013-04-10 NOTE — ED Notes (Signed)
[name] NP into see 

## 2013-04-10 NOTE — ED Notes (Signed)
Up to the bathroom 
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
## 2013-04-11 NOTE — BHH Counselor (Signed)
Pt is pending discharge per psychiatry-Dr. Lolly Mustache and NP-[name]. Writer contacted SW to assist with patient's disposition as per their recommendations patient needs assistance from SW to address housing needs. Contacted Toma Copier (SW on duty) @ [phone] and [phone] and phone went straight to voicemail. Writer left voicemail's on each phone. Followed up with ED secretary/case management to inquire about who to call for SW assistance. Per ED secretary the coverage for amion shows the # [phone], which is going straight to voicemail and this number has already been called. Secretary will continue following up to identify a SW that is able to help so that patient is able to appropriately be discharged from the ED.

## 2013-04-11 NOTE — ED Provider Notes (Signed)
The patient was cleared 5 behavioral health in consultation. For discharge. However patient now states that he is suicidal he is not cleared for discharge.     Shelda Jakes, MD 04/11/13 [phone]

## 2013-04-11 NOTE — ED Notes (Signed)
Patient resting quietly with eyes closed. Respirations even and even, no distress noted. Q 15 minute check continues as ordered to maintain safety.

## 2013-04-11 NOTE — ED Notes (Signed)
Pt resting in bed; no s/s of distress noted. Pt denies SI at this time and does verbally contract for safety.

## 2013-04-11 NOTE — ED Notes (Signed)
Patient resting with eyes closed.   No distress noted.

## 2013-04-11 NOTE — ED Notes (Signed)
Patient is resting comfortably. 

## 2013-04-11 NOTE — Progress Notes (Signed)
Patient Discharge Instructions:  After Visit Summary (AVS):   Faxed to:  04/11/13 Discharge Summary Note:   Faxed to:  04/11/13 Psychiatric Admission Assessment Note:   Faxed to:  04/11/13 Suicide Risk Assessment - Discharge Assessment:   Faxed to:  04/11/13 Faxed/Sent to the Next Level Care provider:  04/11/13 Faxed to [facility] @ [phone]  Jerelene Redden, 04/11/2013, 3:09 PM
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
## 2013-04-11 NOTE — BHH Counselor (Signed)
Patient contracted for safety and arrangements were made to go to a [facility]'s shelter in [facility]. Prior to discharging patient sts he wants a longer term placement like [facility]. Pt feeling suicidal again. Patient's discharge was cancelled and he was referred to [facility] for inpatient treatment. Donell Sievert will evaluate patient for a potential [facility] admission.

## 2013-04-12 ENCOUNTER — Encounter (HOSPITAL_COMMUNITY): Payer: Self-pay | Admitting: Registered Nurse

## 2013-04-12 DIAGNOSIS — F332 Major depressive disorder, recurrent severe without psychotic features: Secondary | ICD-10-CM

## 2013-04-12 NOTE — Progress Notes (Signed)
CSW met with patient. Patient is alert and oriented X3. Patient would like to go by [name]. Patient asked questions regarding taking the bus there. CSW provided bus pass and instructions on how to get from the bus stop outside [place] [address] to [name] on [address]. Patient is agreeable to same. No other needs noted.  Vanden Fawaz C. Garey Alleva MSW, LCSW [phone]

## 2013-04-12 NOTE — BHH Counselor (Signed)
Received a call back from SW-Bethany and she will work on patient's homeless shelter follow up. Once she has completed this task patient will be up for discharge from the ED.

## 2013-04-12 NOTE — Progress Notes (Signed)
[facility] MD Progress Note  04/12/2013 10:21 AM Jeffrey [name]  MRN:  [id number] Subjective:  Rounding with Dr Lolly Mustache, patient was able to contract for safety.  Patient states that he was only depressed because he did not want go back to previous home where he was living with a friend whom he is afraid of.  Patient states that he wants to live somewhere else.  Patient states that he is willing to go to shelter until he is able to find another place.   Diagnosis:   Axis I: Major Depression, Recurrent severe Axis II: Deferred Axis III:  Past Medical History  Diagnosis Date  . Chronic pain   . Depression   . Anxiety   . Suicidal overdose   . GERD (gastroesophageal reflux disease)    Axis IV: economic problems, housing problems, occupational problems, other psychosocial or environmental problems and problems related to social environment Axis V: 51-60 moderate symptoms  ADL's:  Intact  Sleep: Good  Appetite:  Good  Suicidal Ideation:  Denies Homicidal Ideation:  Denies  AEB (as evidenced by):  Psychiatric Specialty Exam: Review of Systems  Constitutional: Negative.   HENT: Negative.   Eyes: Negative.   Respiratory: Negative.   Cardiovascular: Negative.   Gastrointestinal: Negative.   Genitourinary: Negative.   Musculoskeletal: Negative.   Skin: Negative.   Endo/Heme/Allergies: Negative.   Psychiatric/Behavioral: Positive for depression (hx of depression on medication),  and substance abuse (Hx of cocaine addiction, UDS positive for cocaine). Negative for hallucinations suicidal ideations (able to contract for safety), homicidal ideations, and memory loss. The patient is nervous/anxious  about housing.). The patient does not have insomnia.     Blood pressure 106/73, pulse 93, temperature 98.3 F (36.8 C), temperature source Oral, resp. rate 16, SpO2 93.00%.There is no weight on file to calculate BMI.  General Appearance: Casual  Eye Contact::  Good  Speech:  Clear and  Coherent  Volume:  Normal  Mood:  Patient denies depression at this time  Affect:  Congruent  Thought Process:  Coherent and Intact  Orientation:  Full (Time, Place, and Person)  Thought Content:  WDL  Suicidal Thoughts:  No  Homicidal Thoughts:  No  Memory:  Immediate;   Good Recent;   Good Remote;   Good  Judgement:  Poor  Insight:  Good  Psychomotor Activity:  Normal  Concentration:  Good  Recall:  Good  Akathisia:  NA  Handed:  Right  AIMS (if indicated):     Assets:  Desire for Improvement  Sleep:      Current Medications: Current Facility-Administered Medications  Medication Dose Route Frequency Provider Last Rate Last Dose  . alum & mag hydroxide-simeth (MAALOX/MYLANTA) 200-200-20 MG/5ML suspension 30 mL  30 mL Oral PRN Ethelda Chick, MD      . desvenlafaxine (PRISTIQ) 24 hr tablet 100 mg  100 mg Oral Daily Earney Navy, NP   100 mg at 04/12/13 0930  . diphenhydrAMINE (BENADRYL) capsule 25 mg  25 mg Oral QHS Gwen Her, RPH   25 mg at 04/11/13 2126  . gabapentin (NEURONTIN) capsule 800 mg  800 mg Oral TID Ethelda Chick, MD   800 mg at 04/12/13 0930  . ibuprofen (ADVIL,MOTRIN) tablet 600 mg  600 mg Oral Q8H PRN Ethelda Chick, MD      . lidocaine (LIDODERM) 5 % 1 patch  1 patch Transdermal Q24H Ethelda Chick, MD   1 patch at 04/11/13 2128  . loratadine (CLARITIN) tablet  10 mg  10 mg Oral Daily Ethelda Chick, MD   10 mg at 04/12/13 0930  . multivitamin with minerals tablet 1 tablet  1 tablet Oral Daily Ethelda Chick, MD   1 tablet at 04/12/13 0930  . ondansetron (ZOFRAN) tablet 4 mg  4 mg Oral Q8H PRN Ethelda Chick, MD      . pantoprazole (PROTONIX) EC tablet 80 mg  80 mg Oral Q1200 Ethelda Chick, MD   80 mg at 04/11/13 1123  . propranolol (INDERAL) tablet 10 mg  10 mg Oral Daily Ethelda Chick, MD   10 mg at 04/12/13 0930  . traZODone (DESYREL) tablet 150 mg  150 mg Oral QHS Ethelda Chick, MD   150 mg at 04/11/13 2129  . zolpidem (AMBIEN)  tablet 5 mg  5 mg Oral QHS PRN Ethelda Chick, MD       Current Outpatient Prescriptions  Medication Sig Dispense Refill  . cetirizine (ZYRTEC) 10 MG tablet Take 1 tablet (10 mg total) by mouth at bedtime.      . Coenzyme Q10 (COQ10) 100 MG CAPS Take 1 capsule by mouth at bedtime.  30 each    . cyclobenzaprine (FLEXERIL) 5 MG tablet Take 5 mg by mouth 3 (three) times daily as needed for muscle spasms.      Marland Kitchen desvenlafaxine (PRISTIQ) 100 MG 24 hr tablet Take 1 tablet (100 mg total) by mouth daily. For depression  30 tablet  0  . diphenhydrAMINE (BENADRYL) 25 MG tablet Take 1 tablet (25 mg total) by mouth at bedtime.  30 tablet  0  . esomeprazole (NEXIUM) 40 MG capsule Take 1 capsule (40 mg total) by mouth daily before breakfast.      . gabapentin (NEURONTIN) 800 MG tablet Take 1 tablet (800 mg total) by mouth 3 (three) times daily.  90 tablet  0  . lidocaine (LIDODERM) 5 % Place 1 patch onto the skin daily. Remove & Discard patch within 12 hours or as directed by MD  30 patch  0  . loperamide (IMODIUM) 2 MG capsule Take 2 mg by mouth 4 (four) times daily as needed for diarrhea or loose stools.      . Multiple Vitamin (MULTIVITAMIN WITH MINERALS) TABS Take 1 tablet by mouth daily.      . propranolol (INDERAL) 10 MG tablet Take 1 tablet (10 mg total) by mouth daily.      . simvastatin (ZOCOR) 10 MG tablet Take 1 tablet (10 mg total) by mouth at bedtime.      . SUMAtriptan (IMITREX) 50 MG tablet Take 1 tablet (50 mg total) by mouth every 2 (two) hours as needed for migraine.  10 tablet  0  . traMADol (ULTRAM) 50 MG tablet Take 1 tablet (50 mg total) by mouth at bedtime.  30 tablet    . traZODone (DESYREL) 150 MG tablet Take 1 tablet (150 mg total) by mouth at bedtime.  30 tablet  0    Lab Results: No results found for this or any previous visit (from the past 48 hour(s)).  Physical Findings: AIMS:  , ,  ,  ,    CIWA:    COWS:     Treatment Plan Summary: Discharge.  SW to assist in finding a  shelter for living arrangement.  Resources for assistance   Plan:  Consult and face to face interview with Dr Lolly Mustache   Plan: SW referral for assistance with housing.  Medical Decision Making  Problem Points:  Established problem, stable/improving (1) Data Points:  Review and summation of old records (2)  I certify that inpatient services furnished can reasonably be expected to improve the patient's condition.   Rankin, Shuvon   FNP-BC 04/12/2013, 10:21 AM  I personally seen the patient agreed with the findings and involved in the treatment plan.

## 2013-04-12 NOTE — ED Provider Notes (Signed)
11:01 AM Patient was seen and evaluated by the psychiatrist Dr. Francella Solian, MD who recommends discharge home.  He believes the patient is a threat to himself or to others and the patient's outpatient resources.  I did not personally evaluate this patient.  Please see consultation note for complete details  Lyanne Co, MD 04/12/13 1101

## 2013-04-12 NOTE — ED Notes (Signed)
Patient is resting comfortably with his eyes closed. Respirations even and unlabored . No distress noted. Q 15 minute check continues as ordered to maintain safety.

## 2013-04-12 NOTE — ED Notes (Signed)
Patient is resting comfortably. Respirations even

## 2013-04-12 NOTE — ED Notes (Signed)
Continues to rest quietly  with eyes closed. Respirations even and unlabored. No distress noted.

## 2013-04-18 ENCOUNTER — Encounter (HOSPITAL_COMMUNITY): Payer: Self-pay | Admitting: Emergency Medicine

## 2013-04-18 ENCOUNTER — Emergency Department (HOSPITAL_COMMUNITY)
Admission: EM | Admit: 2013-04-18 | Discharge: 2013-04-19 | Disposition: A | Discharge status: Home / Self Care | Payer: Medicaid Other | Attending: Emergency Medicine | Admitting: Emergency Medicine

## 2013-04-18 DIAGNOSIS — F17200 Nicotine dependence, unspecified, uncomplicated: Secondary | ICD-10-CM | POA: Insufficient documentation

## 2013-04-18 DIAGNOSIS — K219 Gastro-esophageal reflux disease without esophagitis: Secondary | ICD-10-CM | POA: Insufficient documentation

## 2013-04-18 DIAGNOSIS — Z0289 Encounter for other administrative examinations: Secondary | ICD-10-CM | POA: Insufficient documentation

## 2013-04-18 DIAGNOSIS — F3289 Other specified depressive episodes: Secondary | ICD-10-CM | POA: Insufficient documentation

## 2013-04-18 DIAGNOSIS — F329 Major depressive disorder, single episode, unspecified: Secondary | ICD-10-CM | POA: Insufficient documentation

## 2013-04-18 DIAGNOSIS — R45851 Suicidal ideations: Secondary | ICD-10-CM | POA: Insufficient documentation

## 2013-04-18 DIAGNOSIS — Z79899 Other long term (current) drug therapy: Secondary | ICD-10-CM | POA: Insufficient documentation

## 2013-04-18 DIAGNOSIS — F411 Generalized anxiety disorder: Secondary | ICD-10-CM | POA: Insufficient documentation

## 2013-04-18 DIAGNOSIS — G8929 Other chronic pain: Secondary | ICD-10-CM | POA: Insufficient documentation

## 2013-04-18 DIAGNOSIS — R45850 Homicidal ideations: Secondary | ICD-10-CM | POA: Insufficient documentation

## 2013-04-18 LAB — BASIC METABOLIC PANEL
BUN: 10 mg/dL (ref 6–23)
Calcium: 10.3 mg/dL (ref 8.4–10.5)
Creatinine, Ser: 1.11 mg/dL (ref 0.50–1.35)
GFR calc Af Amer: 86 mL/min — ABNORMAL LOW (ref >90)
GFR calc non Af Amer: 74 mL/min — ABNORMAL LOW (ref >90)

## 2013-04-18 LAB — CBC WITH DIFFERENTIAL/PLATELET
Basophils Relative: 0 % (ref 0–1)
Eosinophils Absolute: 0.1 10*3/uL (ref 0.0–0.7)
Eosinophils Relative: 1 % (ref 0–5)
HCT: 48.4 % (ref 39.0–52.0)
Hemoglobin: 17.1 g/dL — ABNORMAL HIGH (ref 13.0–17.0)
MCH: 30.9 pg (ref 26.0–34.0)
MCHC: 35.3 g/dL (ref 30.0–36.0)
Monocytes Absolute: 0.6 10*3/uL (ref 0.1–1.0)
Monocytes Relative: 8 % (ref 3–12)

## 2013-04-18 LAB — URINALYSIS, ROUTINE W REFLEX MICROSCOPIC
Glucose, UA: NEGATIVE mg/dL
Leukocytes, UA: NEGATIVE
Protein, ur: NEGATIVE mg/dL
pH: 7.5 (ref 5.0–8.0)

## 2013-04-18 LAB — RAPID URINE DRUG SCREEN, HOSP PERFORMED: Amphetamines: NOT DETECTED

## 2013-04-18 NOTE — ED Notes (Signed)
Per pt, angry at roommate's sister-she is living there without paying bills/rent-states he does not feel safe living in that enviornment

## 2013-04-18 NOTE — ED Notes (Signed)
Belongings behind nurses station

## 2013-04-18 NOTE — ED Provider Notes (Signed)
History    CSN: [id number] Arrival date & time 04/18/13  1741  First MD Initiated Contact with Patient 04/18/13 1818     Chief Complaint  Patient presents with  . Medical Clearance    HPI Pt was seen at 1820.  Per pt, c/o gradual onset and worsening of persistent depression, SI and HI for the past 1 week. Pt states he is living with a roommate and his roommate's sister "who are bipolar and drink alcohol and smoke marijuana all day."  Pt states he "doesn't feel safe" in that environment and that "makes me feel suicidal." Pt was evaluated in the ED 1 week ago for same, and was discharged. Denies SA, no hallucinations.    Past Medical History  Diagnosis Date  . Chronic pain   . Depression   . Anxiety   . Suicidal overdose   . GERD (gastroesophageal reflux disease)    Past Surgical History  Procedure Laterality Date  . Ankle surgery    . Hernia repair    . Neck fusion      History  Substance Use Topics  . Smoking status: Current Every Day Smoker -- 1.50 packs/day    Types: Cigarettes  . Smokeless tobacco: Not on file  . Alcohol Use: No    Review of Systems ROS: Statement: All systems negative except as marked or noted in the HPI; Constitutional: Negative for fever and chills. ; ; Eyes: Negative for eye pain, redness and discharge. ; ; ENMT: Negative for ear pain, hoarseness, nasal congestion, sinus pressure and sore throat. ; ; Cardiovascular: Negative for chest pain, palpitations, diaphoresis, dyspnea and peripheral edema. ; ; Respiratory: Negative for cough, wheezing and stridor. ; ; Gastrointestinal: Negative for nausea, vomiting, diarrhea, abdominal pain, blood in stool, hematemesis, jaundice and rectal bleeding. . ; ; Genitourinary: Negative for dysuria, flank pain and hematuria. ; ; Musculoskeletal: Negative for back pain and neck pain. Negative for swelling and trauma.; ; Skin: Negative for pruritus, rash, abrasions, blisters, bruising and skin lesion.; ; Neuro: Negative  for headache, lightheadedness and neck stiffness. Negative for weakness, altered level of consciousness , altered mental status, extremity weakness, paresthesias, involuntary movement, seizure and syncope.; Psych:  +SI, +HI. No SA, no hallucinations.      Allergies  Abilify; Ativan; Celexa; Effexor; Levaquin; Lexapro; Lipitor; Prozac; Toradol; Tylenol; and Valium  Home Medications   Current Outpatient Rx  Name  Route  Sig  Dispense  Refill  . BEE POLLEN PO   Oral   Take 1 capsule by mouth 2 (two) times daily.         . cetirizine (ZYRTEC) 10 MG tablet   Oral   Take 1 tablet (10 mg total) by mouth at bedtime.         . Coenzyme Q10 (COQ10) 100 MG CAPS   Oral   Take 1 capsule by mouth at bedtime.   30 each      . cyclobenzaprine (FLEXERIL) 5 MG tablet   Oral   Take 5 mg by mouth 3 (three) times daily as needed for muscle spasms.         Marland Kitchen desvenlafaxine (PRISTIQ) 100 MG 24 hr tablet   Oral   Take 1 tablet (100 mg total) by mouth daily. For depression   30 tablet   0   . diphenhydrAMINE (BENADRYL) 25 MG tablet   Oral   Take 1 tablet (25 mg total) by mouth at bedtime.   30 tablet   0   .  esomeprazole (NEXIUM) 40 MG capsule   Oral   Take 1 capsule (40 mg total) by mouth daily before breakfast.         . gabapentin (NEURONTIN) 800 MG tablet   Oral   Take 1 tablet (800 mg total) by mouth 3 (three) times daily.   90 tablet   0   . Ginkgo Biloba 40 MG TABS   Oral   Take 120 mg by mouth every morning.         . hydrOXYzine (VISTARIL) 25 MG capsule   Oral   Take 25-50 mg by mouth 2 (two) times daily. Take 1 capsule every morning and 2 capsules every night.         . lidocaine (LIDODERM) 5 %   Transdermal   Place 1 patch onto the skin daily. Remove & Discard patch within 12 hours or as directed by MD   30 patch   0   . Multiple Vitamin (MULTIVITAMIN WITH MINERALS) TABS   Oral   Take 1 tablet by mouth daily.         . propranolol (INDERAL) 10 MG  tablet   Oral   Take 1 tablet (10 mg total) by mouth daily.         . simvastatin (ZOCOR) 10 MG tablet   Oral   Take 1 tablet (10 mg total) by mouth at bedtime.         . traMADol (ULTRAM) 50 MG tablet   Oral   Take 1 tablet (50 mg total) by mouth at bedtime.   30 tablet      . traZODone (DESYREL) 150 MG tablet   Oral   Take 1 tablet (150 mg total) by mouth at bedtime.   30 tablet   0   . loperamide (IMODIUM) 2 MG capsule   Oral   Take 2 mg by mouth 4 (four) times daily as needed for diarrhea or loose stools.         . SUMAtriptan (IMITREX) 50 MG tablet   Oral   Take 1 tablet (50 mg total) by mouth every 2 (two) hours as needed for migraine.   10 tablet   0    There were no vitals taken for this visit.  Physical Exam 1825: Physical examination:  Nursing notes reviewed; Vital signs and O2 SAT reviewed;  Constitutional: Well developed, Well nourished, Well hydrated, In no acute distress; Head:  Normocephalic, atraumatic; Eyes: EOMI, PERRL, No scleral icterus; ENMT: Mouth and pharynx normal, Mucous membranes moist; Neck: Supple, Full range of motion, No lymphadenopathy; Cardiovascular: Regular rate and rhythm, No murmur, rub, or gallop; Respiratory: Breath sounds clear & equal bilaterally, No rales, rhonchi, wheezes.  Speaking full sentences with ease, Normal respiratory effort/excursion; Chest: Nontender, Movement normal; Abdomen: Soft, Nontender, Nondistended, Normal bowel sounds; Genitourinary: No CVA tenderness; Extremities: Pulses normal, No tenderness, No edema, No calf edema or asymmetry.; Neuro: AA&Ox3, Major CN grossly intact.  Speech clear. Gait steady. No gross focal motor or sensory deficits in extremities.; Skin: Color normal, Warm, Dry.; Psych:  Affect flat, poor eye contact. No active psychosis.     ED Course  Procedures     MDM  MDM Reviewed: previous chart, nursing note and vitals Interpretation: labs   Results for orders placed during the  hospital encounter of 04/18/13  CBC WITH DIFFERENTIAL      Result Value Range   WBC 7.9  4.0 - 10.5 K/uL   RBC 5.54  4.22 - 5.81 MIL/uL  Hemoglobin 17.1 (*) 13.0 - 17.0 g/dL   HCT 40.9  81.1 - 91.4 %   MCV 87.4  78.0 - 100.0 fL   MCH 30.9  26.0 - 34.0 pg   MCHC 35.3  30.0 - 36.0 g/dL   RDW 78.2  95.6 - 21.3 %   Platelets 206  150 - 400 K/uL   Neutrophils Relative % 59  43 - 77 %   Neutro Abs 4.7  1.7 - 7.7 K/uL   Lymphocytes Relative 31  12 - 46 %   Lymphs Abs 2.4  0.7 - 4.0 K/uL   Monocytes Relative 8  3 - 12 %   Monocytes Absolute 0.6  0.1 - 1.0 K/uL   Eosinophils Relative 1  0 - 5 %   Eosinophils Absolute 0.1  0.0 - 0.7 K/uL   Basophils Relative 0  0 - 1 %   Basophils Absolute 0.0  0.0 - 0.1 K/uL  BASIC METABOLIC PANEL      Result Value Range   Sodium 137  135 - 145 mEq/L   Potassium 4.4  3.5 - 5.1 mEq/L   Chloride 101  96 - 112 mEq/L   CO2 25  19 - 32 mEq/L   Glucose, Bld 94  70 - 99 mg/dL   BUN 10  6 - 23 mg/dL   Creatinine, Ser 0.86  0.50 - 1.35 mg/dL   Calcium 57.8  8.4 - 46.9 mg/dL   GFR calc non Af Amer 74 (*) >90 mL/min   GFR calc Af Amer 86 (*) >90 mL/min  URINE RAPID DRUG SCREEN (HOSP PERFORMED)      Result Value Range   Opiates NONE DETECTED  NONE DETECTED   Cocaine NONE DETECTED  NONE DETECTED   Benzodiazepines NONE DETECTED  NONE DETECTED   Amphetamines NONE DETECTED  NONE DETECTED   Tetrahydrocannabinol NONE DETECTED  NONE DETECTED   Barbiturates NONE DETECTED  NONE DETECTED  URINALYSIS, ROUTINE W REFLEX MICROSCOPIC      Result Value Range   Color, Urine YELLOW  YELLOW   APPearance CLOUDY (*) CLEAR   Specific Gravity, Urine 1.019  1.005 - 1.030   pH 7.5  5.0 - 8.0   Glucose, UA NEGATIVE  NEGATIVE mg/dL   Hgb urine dipstick NEGATIVE  NEGATIVE   Bilirubin Urine NEGATIVE  NEGATIVE   Ketones, ur NEGATIVE  NEGATIVE mg/dL   Protein, ur NEGATIVE  NEGATIVE mg/dL   Urobilinogen, UA 0.2  0.0 - 1.0 mg/dL   Nitrite NEGATIVE  NEGATIVE   Leukocytes, UA  NEGATIVE  NEGATIVE     2300:  Will have Telepsych eval. Holding orders written.     Laray Anger, DO 04/19/13 [phone]

## 2013-04-19 ENCOUNTER — Encounter (HOSPITAL_COMMUNITY): Payer: Self-pay | Admitting: Registered Nurse

## 2013-04-19 DIAGNOSIS — R45851 Suicidal ideations: Secondary | ICD-10-CM

## 2013-04-19 MED ORDER — PROPRANOLOL HCL 10 MG PO TABS
10.0000 mg | ORAL_TABLET | Freq: Every day | ORAL | Status: DC
  Administered 2013-04-19: 10 mg via ORAL
  Filled 2013-04-19: qty 1

## 2013-04-19 MED ORDER — GABAPENTIN 400 MG PO CAPS
800.0000 mg | ORAL_CAPSULE | Freq: Three times a day (TID) | ORAL | Status: DC
  Administered 2013-04-19: 800 mg via ORAL
  Filled 2013-04-19 (×3): qty 2

## 2013-04-19 MED ORDER — TRAMADOL HCL 50 MG PO TABS: 50.0000 mg | ORAL_TABLET | Freq: Every day | ORAL | Status: DC

## 2013-04-19 MED ORDER — LIDOCAINE 5 % EX PTCH
1.0000 | MEDICATED_PATCH | CUTANEOUS | Status: DC
  Administered 2013-04-19: 1 via TRANSDERMAL
  Filled 2013-04-19: qty 1

## 2013-04-19 MED ORDER — LORATADINE 10 MG PO TABS
10.0000 mg | ORAL_TABLET | Freq: Every day | ORAL | Status: DC
  Administered 2013-04-19: 10 mg via ORAL
  Filled 2013-04-19: qty 1

## 2013-04-19 MED ORDER — LOPERAMIDE HCL 2 MG PO CAPS: 2.0000 mg | ORAL_CAPSULE | Freq: Four times a day (QID) | ORAL | Status: DC | PRN

## 2013-04-19 MED ORDER — SUMATRIPTAN SUCCINATE 50 MG PO TABS
50.0000 mg | ORAL_TABLET | ORAL | Status: DC | PRN
  Filled 2013-04-19: qty 1

## 2013-04-19 MED ORDER — CYCLOBENZAPRINE HCL 10 MG PO TABS: 5.0000 mg | ORAL_TABLET | Freq: Three times a day (TID) | ORAL | Status: DC | PRN

## 2013-04-19 MED ORDER — SIMVASTATIN 10 MG PO TABS
10.0000 mg | ORAL_TABLET | Freq: Every day | ORAL | Status: DC
  Filled 2013-04-19 (×2): qty 1

## 2013-04-19 MED ORDER — TRAZODONE HCL 50 MG PO TABS: 150.0000 mg | ORAL_TABLET | Freq: Every day | ORAL | Status: DC

## 2013-04-19 MED ORDER — DIPHENHYDRAMINE HCL 25 MG PO CAPS
25.0000 mg | ORAL_CAPSULE | Freq: Every day | ORAL | Status: DC
  Filled 2013-04-19: qty 1

## 2013-04-19 MED ORDER — PANTOPRAZOLE SODIUM 40 MG PO TBEC
40.0000 mg | DELAYED_RELEASE_TABLET | Freq: Every day | ORAL | Status: DC
  Administered 2013-04-19: 40 mg via ORAL
  Filled 2013-04-19: qty 1

## 2013-04-19 MED ORDER — HYDROXYZINE HCL 25 MG PO TABS
25.0000 mg | ORAL_TABLET | Freq: Every day | ORAL | Status: DC
  Administered 2013-04-19: 25 mg via ORAL
  Filled 2013-04-19 (×2): qty 1

## 2013-04-19 MED ORDER — HYDROXYZINE HCL 25 MG PO TABS: 50.0000 mg | ORAL_TABLET | Freq: Every day | ORAL | Status: DC

## 2013-04-19 MED ORDER — DESVENLAFAXINE SUCCINATE ER 100 MG PO TB24
100.0000 mg | ORAL_TABLET | Freq: Every day | ORAL | Status: DC
  Administered 2013-04-19: 100 mg via ORAL
  Filled 2013-04-19: qty 1

## 2013-04-19 MED ORDER — ADULT MULTIVITAMIN W/MINERALS CH
1.0000 | ORAL_TABLET | Freq: Every day | ORAL | Status: DC
  Administered 2013-04-19: 1 via ORAL
  Filled 2013-04-19: qty 1

## 2013-04-19 NOTE — BHH Counselor (Signed)
Patient no longer suicidal or homicidal. No AVH's reported. Patient does request assistance with his living arrangements. Sts that he want's to go to [facility] to live with "Ms Kennith Center". Says that Ms. Hines has room and board. Patient provided this Clinical research associate with Ms. Hines contact # [phone]. Writer contacted Ms. Hines whom sts that she owns an adult care home and room/board for homeless individuals. She is hesitant to take patient into her adult care home or room/boarding home. Sts that patient she made arrangement for him in the past and he didn't "show up". Ms. Kennith Center is still interested in trying to assist patient. She sts, "I will try to make arrangements for him to be transported here". Patient was updated with the above information.  Patient was also offered housing at a group home in [place], [place] that has availability. Patient declined this offer stating, "No I would rather go to the [facility] here in [place]".  Patient to be discharged with referrals to local shelters including "[facility]". Pt has address and phone number of the facility.   Writer also provided patient with additional referrals to other shelters including: [facility] in [place] and [facility] in [place]. Writer informed patient that both facilities have their own rules for accepting clients. Writer went over those rules with patient. Pt discharged from the ED to follow up with homeless shelter referrals.   Patient was also encouraged to follow up with [facility] for mental health services. Additionally, he has the number to [facility].

## 2013-04-19 NOTE — ED Notes (Signed)
Pt didn't want any of his bedtime medications because it was too late. He states he will resume today.

## 2013-04-19 NOTE — ED Notes (Signed)
Patient reports SI and HI. Patient reports that he doesn't like his living arrangements. Patient states that his roommate sister started staying with them and does not pay any rent. Patient also reports that the sister smokes pot.

## 2013-04-19 NOTE — Consult Note (Signed)
Reason for Consult: Evaluation for inpatient treatment Referring Physician:  EDP  UNDRAY [name] is an 52 y.o. male.  HPI:  Patient states that he presented to [facility] with suicidal thoughts related to his living situations.  Patient states that he has talked to Ms. Hines contact # [phone] who owns group home and boarding home and that he wants to go stay there.    Patient at this time is denying suicidal ideations, homicidal ideations, psychosis, and paranoia.  Patient states that he is willing to stay in a homeless shelter until Ms. Hines makes arrangement to pick him up.    Past Medical History  Diagnosis Date  . Chronic pain   . Depression   . Anxiety   . Suicidal overdose   . GERD (gastroesophageal reflux disease)     Past Surgical History  Procedure Laterality Date  . Ankle surgery    . Hernia repair    . Neck fusion      No family history on file.  Social History:  reports that he has been smoking Cigarettes.  He has been smoking about 1.50 packs per day. He does not have any smokeless tobacco history on file. He reports that he uses illicit drugs ("Crack" cocaine). He reports that he does not drink alcohol.  Allergies:  Allergies  Allergen Reactions  . Abilify (Aripiprazole) Swelling  . Ativan (Lorazepam)     Extreme nervousness  . Celexa (Citalopram)     Nervousness  . Effexor (Venlafaxine)     Trouble urinating  . Levaquin (Levofloxacin) Diarrhea  . Lexapro (Escitalopram Oxalate)   . Lipitor (Atorvastatin)     Muscle weakness  . Prozac (Fluoxetine)     Difficulty walking  . Toradol (Ketorolac Tromethamine) Nausea And Vomiting  . Tylenol (Acetaminophen)     GI Bleeds, tolerates ibuprofen  . Valium (Diazepam)     Nervousness    Medications: I have reviewed the patient's current medications.  Results for orders placed during the hospital encounter of 04/18/13 (from the past 48 hour(s))  CBC WITH DIFFERENTIAL     Status: Abnormal   Collection Time   04/18/13  6:37 PM      Result Value Range   WBC 7.9  4.0 - 10.5 K/uL   RBC 5.54  4.22 - 5.81 MIL/uL   Hemoglobin 17.1 (*) 13.0 - 17.0 g/dL   HCT 64.4  03.4 - 74.2 %   MCV 87.4  78.0 - 100.0 fL   MCH 30.9  26.0 - 34.0 pg   MCHC 35.3  30.0 - 36.0 g/dL   RDW 59.5  63.8 - 75.6 %   Platelets 206  150 - 400 K/uL   Neutrophils Relative % 59  43 - 77 %   Neutro Abs 4.7  1.7 - 7.7 K/uL   Lymphocytes Relative 31  12 - 46 %   Lymphs Abs 2.4  0.7 - 4.0 K/uL   Monocytes Relative 8  3 - 12 %   Monocytes Absolute 0.6  0.1 - 1.0 K/uL   Eosinophils Relative 1  0 - 5 %   Eosinophils Absolute 0.1  0.0 - 0.7 K/uL   Basophils Relative 0  0 - 1 %   Basophils Absolute 0.0  0.0 - 0.1 K/uL  BASIC METABOLIC PANEL     Status: Abnormal   Collection Time    04/18/13  6:37 PM      Result Value Range   Sodium 137  135 - 145 mEq/L  Potassium 4.4  3.5 - 5.1 mEq/L   Chloride 101  96 - 112 mEq/L   CO2 25  19 - 32 mEq/L   Glucose, Bld 94  70 - 99 mg/dL   BUN 10  6 - 23 mg/dL   Creatinine, Ser 8.11  0.50 - 1.35 mg/dL   Calcium 91.4  8.4 - 78.2 mg/dL   GFR calc non Af Amer 74 (*) >90 mL/min   GFR calc Af Amer 86 (*) >90 mL/min   Comment:            The eGFR has been calculated     using the CKD EPI equation.     This calculation has not been     validated in all clinical     situations.     eGFR's persistently     <90 mL/min signify     possible Chronic Kidney Disease.  URINE RAPID DRUG SCREEN (HOSP PERFORMED)     Status: None   Collection Time    04/18/13  6:52 PM      Result Value Range   Opiates NONE DETECTED  NONE DETECTED   Cocaine NONE DETECTED  NONE DETECTED   Benzodiazepines NONE DETECTED  NONE DETECTED   Amphetamines NONE DETECTED  NONE DETECTED   Tetrahydrocannabinol NONE DETECTED  NONE DETECTED   Barbiturates NONE DETECTED  NONE DETECTED   Comment:            DRUG SCREEN FOR MEDICAL PURPOSES     ONLY.  IF CONFIRMATION IS NEEDED     FOR ANY PURPOSE, NOTIFY LAB     WITHIN 5 DAYS.                 LOWEST DETECTABLE LIMITS     FOR URINE DRUG SCREEN     Drug Class       Cutoff (ng/mL)     Amphetamine      1000     Barbiturate      200     Benzodiazepine   200     Tricyclics       300     Opiates          300     Cocaine          300     THC              50  URINALYSIS, ROUTINE W REFLEX MICROSCOPIC     Status: Abnormal   Collection Time    04/18/13  6:52 PM      Result Value Range   Color, Urine YELLOW  YELLOW   APPearance CLOUDY (*) CLEAR   Specific Gravity, Urine 1.019  1.005 - 1.030   pH 7.5  5.0 - 8.0   Glucose, UA NEGATIVE  NEGATIVE mg/dL   Hgb urine dipstick NEGATIVE  NEGATIVE   Bilirubin Urine NEGATIVE  NEGATIVE   Ketones, ur NEGATIVE  NEGATIVE mg/dL   Protein, ur NEGATIVE  NEGATIVE mg/dL   Urobilinogen, UA 0.2  0.0 - 1.0 mg/dL   Nitrite NEGATIVE  NEGATIVE   Leukocytes, UA NEGATIVE  NEGATIVE   Comment: MICROSCOPIC NOT DONE ON URINES WITH NEGATIVE PROTEIN, BLOOD, LEUKOCYTES, NITRITE, OR GLUCOSE <1000 mg/dL.    No results found.  Review of Systems  Psychiatric/Behavioral: Positive for depression and hallucinations (history.  UDS negative this visit). Negative for suicidal ideas and memory loss. The patient is not nervous/anxious and does not have insomnia.    Blood pressure 115/79,  pulse 75, temperature 98 F (36.7 C), temperature source Oral, resp. rate 18, SpO2 94.00%. Physical Exam  Constitutional: He is oriented to person, place, and time. He appears well-developed.  HENT:  Head: Normocephalic.  Neck: Normal range of motion.  Respiratory: Effort normal.  Neurological: He is alert and oriented to person, place, and time.  Skin: Skin is warm and dry.  Psychiatric: His speech is normal. His mood appears not anxious. His affect is not angry. He is not agitated, not aggressive, not withdrawn and not actively hallucinating. Thought content is not paranoid. He expresses no homicidal and no suicidal ideation.   Face to face interview and consult with Dr.  Lolly Mustache Assessment/Plan: Axis I: Suicidal Idations  Recommendations:  Discharge home and follow up with [facility].  Information and resource material for outpatient assistive services.    Rankin, Shuvon, FNP-BC 04/19/2013, 2:53 PM     I have personally seen the patient and agreed with the findings and involved in the treatment plan. Kathryne Sharper, MD

## 2013-04-19 NOTE — ED Provider Notes (Signed)
4:13 AM tele psych consult was obtained and per Dr. Jacky Kindle, diagnosis Maj. depressive disorder - he recommends admit to psych unit.   Filed Vitals:   04/19/13 0333  BP: 109/78  Pulse: 94  Temp: 98.4 F (36.9 C)  Resp: 20     Sunnie Nielsen, MD 04/19/13 [phone]

## 2013-04-19 NOTE — ED Provider Notes (Signed)
Has been seen by psychiatry including our psychiatrist Dr. Lolly Mustache who has recommended that the patient can be discharged, he has been given resource list by psychiatry, stable for discharge at this time   Vida Roller, MD 04/19/13 1338

## 2013-04-19 NOTE — ED Notes (Signed)
Pt receiving his telepsych now

## 2013-04-19 NOTE — ED Notes (Signed)
Company has received the fax

## 2013-04-19 NOTE — ED Notes (Signed)
Pt continues to wait for telepysch 

## 2013-05-09 ENCOUNTER — Inpatient Hospital Stay (HOSPITAL_COMMUNITY)
Admission: AD | Admit: 2013-05-09 | Discharge: 2013-05-17 | DRG: 885 | Disposition: A | Discharge status: Home / Self Care | Payer: Medicaid Other | Source: Intra-hospital | Attending: Psychiatry | Admitting: Psychiatry

## 2013-05-09 ENCOUNTER — Encounter (HOSPITAL_COMMUNITY): Payer: Self-pay

## 2013-05-09 ENCOUNTER — Encounter (HOSPITAL_COMMUNITY): Payer: Self-pay | Admitting: *Deleted

## 2013-05-09 ENCOUNTER — Emergency Department (HOSPITAL_COMMUNITY)
Admission: EM | Admit: 2013-05-09 | Discharge: 2013-05-09 | Disposition: A | Discharge status: Other Acute Inpatient Hospital | Payer: Medicaid Other | Attending: Emergency Medicine | Admitting: Emergency Medicine

## 2013-05-09 DIAGNOSIS — F14120 Cocaine abuse with intoxication, uncomplicated: Secondary | ICD-10-CM

## 2013-05-09 DIAGNOSIS — F1410 Cocaine abuse, uncomplicated: Secondary | ICD-10-CM | POA: Diagnosis present

## 2013-05-09 DIAGNOSIS — F17200 Nicotine dependence, unspecified, uncomplicated: Secondary | ICD-10-CM | POA: Insufficient documentation

## 2013-05-09 DIAGNOSIS — G894 Chronic pain syndrome: Secondary | ICD-10-CM

## 2013-05-09 DIAGNOSIS — F329 Major depressive disorder, single episode, unspecified: Secondary | ICD-10-CM | POA: Insufficient documentation

## 2013-05-09 DIAGNOSIS — K219 Gastro-esophageal reflux disease without esophagitis: Secondary | ICD-10-CM | POA: Diagnosis present

## 2013-05-09 DIAGNOSIS — F411 Generalized anxiety disorder: Secondary | ICD-10-CM | POA: Diagnosis present

## 2013-05-09 DIAGNOSIS — G709 Myoneural disorder, unspecified: Secondary | ICD-10-CM

## 2013-05-09 DIAGNOSIS — Z888 Allergy status to other drugs, medicaments and biological substances status: Secondary | ICD-10-CM | POA: Insufficient documentation

## 2013-05-09 DIAGNOSIS — F322 Major depressive disorder, single episode, severe without psychotic features: Secondary | ICD-10-CM

## 2013-05-09 DIAGNOSIS — G8929 Other chronic pain: Secondary | ICD-10-CM | POA: Insufficient documentation

## 2013-05-09 DIAGNOSIS — F3289 Other specified depressive episodes: Secondary | ICD-10-CM | POA: Insufficient documentation

## 2013-05-09 DIAGNOSIS — R45851 Suicidal ideations: Secondary | ICD-10-CM

## 2013-05-09 DIAGNOSIS — R110 Nausea: Secondary | ICD-10-CM | POA: Insufficient documentation

## 2013-05-09 DIAGNOSIS — Z79899 Other long term (current) drug therapy: Secondary | ICD-10-CM | POA: Insufficient documentation

## 2013-05-09 DIAGNOSIS — Z881 Allergy status to other antibiotic agents status: Secondary | ICD-10-CM | POA: Insufficient documentation

## 2013-05-09 DIAGNOSIS — R000 Tachycardia, unspecified: Secondary | ICD-10-CM | POA: Insufficient documentation

## 2013-05-09 DIAGNOSIS — R630 Anorexia: Secondary | ICD-10-CM | POA: Insufficient documentation

## 2013-05-09 DIAGNOSIS — G4700 Insomnia, unspecified: Secondary | ICD-10-CM | POA: Diagnosis present

## 2013-05-09 DIAGNOSIS — M62838 Other muscle spasm: Secondary | ICD-10-CM | POA: Insufficient documentation

## 2013-05-09 DIAGNOSIS — F332 Major depressive disorder, recurrent severe without psychotic features: Principal | ICD-10-CM | POA: Diagnosis present

## 2013-05-09 DIAGNOSIS — G479 Sleep disorder, unspecified: Secondary | ICD-10-CM | POA: Insufficient documentation

## 2013-05-09 HISTORY — DX: Myoneural disorder, unspecified: G70.9

## 2013-05-09 LAB — RAPID URINE DRUG SCREEN, HOSP PERFORMED
Amphetamines: NOT DETECTED
Cocaine: POSITIVE — AB
Opiates: NOT DETECTED
Tetrahydrocannabinol: NOT DETECTED

## 2013-05-09 LAB — COMPREHENSIVE METABOLIC PANEL
ALT: 27 U/L (ref 0–53)
Albumin: 4.6 g/dL (ref 3.5–5.2)
Alkaline Phosphatase: 79 U/L (ref 39–117)
Chloride: 102 mEq/L (ref 96–112)
Glucose, Bld: 138 mg/dL — ABNORMAL HIGH (ref 70–99)
Potassium: 3.8 mEq/L (ref 3.5–5.1)
Sodium: 140 mEq/L (ref 135–145)
Total Protein: 8.4 g/dL — ABNORMAL HIGH (ref 6.0–8.3)

## 2013-05-09 LAB — URINE MICROSCOPIC-ADD ON

## 2013-05-09 LAB — URINALYSIS, ROUTINE W REFLEX MICROSCOPIC
Ketones, ur: 15 mg/dL — AB
Protein, ur: 100 mg/dL — AB
Urobilinogen, UA: 0.2 mg/dL (ref 0.0–1.0)

## 2013-05-09 LAB — CBC
Hemoglobin: 18.1 g/dL — ABNORMAL HIGH (ref 13.0–17.0)
MCHC: 35.8 g/dL (ref 30.0–36.0)
WBC: 14 10*3/uL — ABNORMAL HIGH (ref 4.0–10.5)

## 2013-05-09 MED ORDER — HYDROXYZINE HCL 25 MG PO TABS
25.0000 mg | ORAL_TABLET | Freq: Every day | ORAL | Status: DC
  Administered 2013-05-09: 25 mg via ORAL
  Filled 2013-05-09: qty 1

## 2013-05-09 MED ORDER — LOPERAMIDE HCL 2 MG PO CAPS: 2.0000 mg | ORAL_CAPSULE | Freq: Four times a day (QID) | ORAL | Status: DC | PRN

## 2013-05-09 MED ORDER — SUMATRIPTAN SUCCINATE 50 MG PO TABS
50.0000 mg | ORAL_TABLET | ORAL | Status: DC | PRN
  Filled 2013-05-09: qty 1

## 2013-05-09 MED ORDER — LORAZEPAM 1 MG PO TABS: 1.0000 mg | ORAL_TABLET | Freq: Three times a day (TID) | ORAL | Status: DC | PRN

## 2013-05-09 MED ORDER — COQ10 100 MG PO CAPS: 1.0000 | ORAL_CAPSULE | Freq: Every day | ORAL | Status: DC

## 2013-05-09 MED ORDER — PANTOPRAZOLE SODIUM 40 MG PO TBEC
40.0000 mg | DELAYED_RELEASE_TABLET | Freq: Every day | ORAL | Status: DC
  Administered 2013-05-10 – 2013-05-17 (×8): 40 mg via ORAL
  Filled 2013-05-09 (×10): qty 1

## 2013-05-09 MED ORDER — PROPRANOLOL HCL 10 MG PO TABS
10.0000 mg | ORAL_TABLET | Freq: Every day | ORAL | Status: DC
  Administered 2013-05-09: 10 mg via ORAL
  Filled 2013-05-09: qty 1

## 2013-05-09 MED ORDER — DESVENLAFAXINE SUCCINATE ER 100 MG PO TB24: 100.0000 mg | ORAL_TABLET | Freq: Every day | ORAL | Status: DC

## 2013-05-09 MED ORDER — LORATADINE 10 MG PO TABS
10.0000 mg | ORAL_TABLET | Freq: Every day | ORAL | Status: DC
  Administered 2013-05-09: 10 mg via ORAL
  Filled 2013-05-09: qty 1

## 2013-05-09 MED ORDER — ONDANSETRON HCL 4 MG PO TABS: 4.0000 mg | ORAL_TABLET | Freq: Three times a day (TID) | ORAL | Status: DC | PRN

## 2013-05-09 MED ORDER — TRAMADOL HCL 50 MG PO TABS
50.0000 mg | ORAL_TABLET | Freq: Every day | ORAL | Status: DC
  Administered 2013-05-09 – 2013-05-16 (×8): 50 mg via ORAL
  Filled 2013-05-09 (×8): qty 1

## 2013-05-09 MED ORDER — LORATADINE 10 MG PO TABS
10.0000 mg | ORAL_TABLET | Freq: Every day | ORAL | Status: DC
  Administered 2013-05-10 – 2013-05-17 (×8): 10 mg via ORAL
  Filled 2013-05-09 (×10): qty 1

## 2013-05-09 MED ORDER — PANTOPRAZOLE SODIUM 40 MG PO TBEC
40.0000 mg | DELAYED_RELEASE_TABLET | Freq: Every day | ORAL | Status: DC
  Administered 2013-05-09: 40 mg via ORAL
  Filled 2013-05-09: qty 1

## 2013-05-09 MED ORDER — CYCLOBENZAPRINE HCL 10 MG PO TABS: 5.0000 mg | ORAL_TABLET | Freq: Three times a day (TID) | ORAL | Status: DC | PRN

## 2013-05-09 MED ORDER — ADULT MULTIVITAMIN W/MINERALS CH
1.0000 | ORAL_TABLET | Freq: Every day | ORAL | Status: DC
  Administered 2013-05-10 – 2013-05-17 (×8): 1 via ORAL
  Filled 2013-05-09 (×10): qty 1

## 2013-05-09 MED ORDER — ALUM & MAG HYDROXIDE-SIMETH 200-200-20 MG/5ML PO SUSP: 30.0000 mL | ORAL | Status: DC | PRN

## 2013-05-09 MED ORDER — MAGNESIUM HYDROXIDE 400 MG/5ML PO SUSP: 30.0000 mL | Freq: Every day | ORAL | Status: DC | PRN

## 2013-05-09 MED ORDER — ZOLPIDEM TARTRATE 5 MG PO TABS: 5.0000 mg | ORAL_TABLET | Freq: Every evening | ORAL | Status: DC | PRN

## 2013-05-09 MED ORDER — DESVENLAFAXINE SUCCINATE ER 100 MG PO TB24
100.0000 mg | ORAL_TABLET | Freq: Every day | ORAL | Status: DC
  Administered 2013-05-10 – 2013-05-17 (×8): 100 mg via ORAL
  Filled 2013-05-09 (×8): qty 1
  Filled 2013-05-09: qty 3
  Filled 2013-05-09: qty 1

## 2013-05-09 MED ORDER — PROPRANOLOL HCL 10 MG PO TABS
10.0000 mg | ORAL_TABLET | Freq: Every day | ORAL | Status: DC
  Administered 2013-05-10 – 2013-05-17 (×8): 10 mg via ORAL
  Filled 2013-05-09 (×10): qty 1

## 2013-05-09 MED ORDER — DIPHENHYDRAMINE HCL 25 MG PO TABS
25.0000 mg | ORAL_TABLET | Freq: Every day | ORAL | Status: DC
  Filled 2013-05-09: qty 1

## 2013-05-09 MED ORDER — SUMATRIPTAN SUCCINATE 50 MG PO TABS
50.0000 mg | ORAL_TABLET | ORAL | Status: DC | PRN
  Administered 2013-05-14: 50 mg via ORAL
  Filled 2013-05-09: qty 1

## 2013-05-09 MED ORDER — GABAPENTIN 400 MG PO CAPS
800.0000 mg | ORAL_CAPSULE | Freq: Three times a day (TID) | ORAL | Status: DC
  Administered 2013-05-09: 800 mg via ORAL
  Filled 2013-05-09 (×3): qty 2

## 2013-05-09 MED ORDER — TRAMADOL HCL 50 MG PO TABS: 50.0000 mg | ORAL_TABLET | Freq: Every day | ORAL | Status: DC

## 2013-05-09 MED ORDER — PNEUMOCOCCAL VAC POLYVALENT 25 MCG/0.5ML IJ INJ
0.5000 mL | INJECTION | INTRAMUSCULAR | Status: AC
  Administered 2013-05-10: 0.5 mL via INTRAMUSCULAR

## 2013-05-09 MED ORDER — IBUPROFEN 400 MG PO TABS: 600.0000 mg | ORAL_TABLET | Freq: Three times a day (TID) | ORAL | Status: DC | PRN

## 2013-05-09 MED ORDER — HYDROXYZINE HCL 25 MG PO TABS: 50.0000 mg | ORAL_TABLET | Freq: Every day | ORAL | Status: DC

## 2013-05-09 MED ORDER — VENLAFAXINE HCL ER 150 MG PO CP24
150.0000 mg | ORAL_CAPSULE | Freq: Every day | ORAL | Status: DC
  Filled 2013-05-09: qty 1

## 2013-05-09 MED ORDER — HYDROXYZINE HCL 50 MG PO TABS
50.0000 mg | ORAL_TABLET | Freq: Every evening | ORAL | Status: DC | PRN
  Administered 2013-05-09 – 2013-05-16 (×14): 50 mg via ORAL
  Filled 2013-05-09 (×9): qty 1

## 2013-05-09 MED ORDER — TRAZODONE HCL 50 MG PO TABS: 150.0000 mg | ORAL_TABLET | Freq: Every day | ORAL | Status: DC

## 2013-05-09 MED ORDER — NICOTINE 21 MG/24HR TD PT24
21.0000 mg | MEDICATED_PATCH | Freq: Every day | TRANSDERMAL | Status: DC
  Filled 2013-05-09: qty 1

## 2013-05-09 MED ORDER — GABAPENTIN 800 MG PO TABS
800.0000 mg | ORAL_TABLET | Freq: Three times a day (TID) | ORAL | Status: DC
  Filled 2013-05-09 (×3): qty 1

## 2013-05-09 MED ORDER — PANTOPRAZOLE SODIUM 40 MG PO TBEC: 80.0000 mg | DELAYED_RELEASE_TABLET | Freq: Every day | ORAL | Status: DC

## 2013-05-09 MED ORDER — SIMVASTATIN 10 MG PO TABS
10.0000 mg | ORAL_TABLET | Freq: Every day | ORAL | Status: DC
  Filled 2013-05-09: qty 1

## 2013-05-09 MED ORDER — LIDOCAINE 5 % EX PTCH
1.0000 | MEDICATED_PATCH | CUTANEOUS | Status: DC
  Filled 2013-05-09 (×2): qty 1

## 2013-05-09 MED ORDER — GABAPENTIN 800 MG PO TABS
800.0000 mg | ORAL_TABLET | Freq: Three times a day (TID) | ORAL | Status: DC
  Administered 2013-05-10 – 2013-05-17 (×21): 800 mg via ORAL
  Filled 2013-05-09 (×4): qty 1
  Filled 2013-05-09: qty 9
  Filled 2013-05-09 (×6): qty 1
  Filled 2013-05-09: qty 9
  Filled 2013-05-09 (×2): qty 1
  Filled 2013-05-09: qty 9
  Filled 2013-05-09 (×13): qty 1

## 2013-05-09 MED ORDER — GINKGO BILOBA 40 MG PO TABS: 120.0000 mg | ORAL_TABLET | Freq: Every morning | ORAL | Status: DC

## 2013-05-09 MED ORDER — LIDOCAINE 5 % EX PTCH
1.0000 | MEDICATED_PATCH | CUTANEOUS | Status: DC
  Administered 2013-05-09: 1 via TRANSDERMAL
  Filled 2013-05-09: qty 1

## 2013-05-09 MED ORDER — TRAZODONE HCL 150 MG PO TABS
150.0000 mg | ORAL_TABLET | Freq: Every day | ORAL | Status: DC
  Administered 2013-05-09 – 2013-05-11 (×3): 150 mg via ORAL
  Filled 2013-05-09 (×2): qty 1
  Filled 2013-05-09 (×2): qty 3
  Filled 2013-05-09 (×2): qty 1

## 2013-05-09 MED ORDER — HYDROXYZINE PAMOATE 25 MG PO CAPS: 25.0000 mg | ORAL_CAPSULE | Freq: Two times a day (BID) | ORAL | Status: DC

## 2013-05-09 NOTE — ED Notes (Signed)
Pt states he has been under a lot of stress, dealing with people he once lived with who showed up when he does not want them to. His plan is to either run his scooter out into heavy traffic, or take a bunch of pills. Admits to doing crack cocaine last night.
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
## 2013-05-09 NOTE — ED Provider Notes (Signed)
CSN: [id number]     Arrival date & time 05/09/13  8295 History     First MD Initiated Contact with Patient 05/09/13 0636     Chief Complaint  Patient presents with  . Suicidal   (Consider location/radiation/quality/duration/timing/severity/associated sxs/prior Treatment) HPI Comments: 52 y/o male with a PMHx of depression, anxiety and prior suicide attempts presents to the ED with suicidal thoughts building up over the past month. Patient states his depression is worsening, has been skipping doses of his medication, not eating or sleeping well. Has a plan to ride his scooter into traffic or overdose on pills. He was last admitted to the psych unit for similar symptoms on 7/15. At that time he had homicidal thoughts which are not present at this time. Admits to cocaine use last night, states he "binges" on cocaine and does not use it daily. Denies any other drug or alcohol use. Currently he is a little nauseated and nervous, otherwise has no other complaints. Does not see his psychiatrist regularly at [facility] because he "does not care for her".   The history is provided by the patient.    Past Medical History  Diagnosis Date  . Chronic pain   . Depression   . Anxiety   . Suicidal overdose   . GERD (gastroesophageal reflux disease)    Past Surgical History  Procedure Laterality Date  . Ankle surgery    . Hernia repair    . Neck fusion     No family history on file. History  Substance Use Topics  . Smoking status: Current Every Day Smoker -- 1.50 packs/day    Types: Cigarettes  . Smokeless tobacco: Not on file  . Alcohol Use: No    Review of Systems  Constitutional: Positive for activity change and appetite change.  Respiratory: Negative for shortness of breath.   Cardiovascular: Negative for chest pain.  Gastrointestinal: Positive for nausea. Negative for vomiting and abdominal pain.  Psychiatric/Behavioral: Positive for suicidal ideas, sleep disturbance and dysphoric mood.  Negative for hallucinations and self-injury. The patient is nervous/anxious.   All other systems reviewed and are negative.    Allergies  Abilify; Ativan; Celexa; Effexor; Levaquin; Lexapro; Lipitor; Prozac; Toradol; Tylenol; and Valium  Home Medications   Current Outpatient Rx  Name  Route  Sig  Dispense  Refill  . BEE POLLEN PO   Oral   Take 1 capsule by mouth 2 (two) times daily.         . cetirizine (ZYRTEC) 10 MG tablet   Oral   Take 1 tablet (10 mg total) by mouth at bedtime.         . Coenzyme Q10 (COQ10) 100 MG CAPS   Oral   Take 1 capsule by mouth at bedtime.   30 each      . cyclobenzaprine (FLEXERIL) 5 MG tablet   Oral   Take 5 mg by mouth 3 (three) times daily as needed for muscle spasms.         Marland Kitchen desvenlafaxine (PRISTIQ) 100 MG 24 hr tablet   Oral   Take 1 tablet (100 mg total) by mouth daily. For depression   30 tablet   0   . diphenhydrAMINE (BENADRYL) 25 MG tablet   Oral   Take 1 tablet (25 mg total) by mouth at bedtime.   30 tablet   0   . esomeprazole (NEXIUM) 40 MG capsule   Oral   Take 1 capsule (40 mg total) by mouth daily before breakfast.         .  gabapentin (NEURONTIN) 800 MG tablet   Oral   Take 1 tablet (800 mg total) by mouth 3 (three) times daily.   90 tablet   0   . Ginkgo Biloba 40 MG TABS   Oral   Take 120 mg by mouth every morning.         . hydrOXYzine (VISTARIL) 25 MG capsule   Oral   Take 25-50 mg by mouth 2 (two) times daily. Take 1 capsule every morning and 2 capsules every night.         . lidocaine (LIDODERM) 5 %   Transdermal   Place 1 patch onto the skin daily. Remove & Discard patch within 12 hours or as directed by MD   30 patch   0   . loperamide (IMODIUM) 2 MG capsule   Oral   Take 2 mg by mouth 4 (four) times daily as needed for diarrhea or loose stools.         . Multiple Vitamin (MULTIVITAMIN WITH MINERALS) TABS   Oral   Take 1 tablet by mouth daily.         . propranolol  (INDERAL) 10 MG tablet   Oral   Take 1 tablet (10 mg total) by mouth daily.         . simvastatin (ZOCOR) 10 MG tablet   Oral   Take 1 tablet (10 mg total) by mouth at bedtime.         . SUMAtriptan (IMITREX) 50 MG tablet   Oral   Take 1 tablet (50 mg total) by mouth every 2 (two) hours as needed for migraine.   10 tablet   0   . traMADol (ULTRAM) 50 MG tablet   Oral   Take 1 tablet (50 mg total) by mouth at bedtime.   30 tablet      . traZODone (DESYREL) 150 MG tablet   Oral   Take 1 tablet (150 mg total) by mouth at bedtime.   30 tablet   0    BP 149/105  Pulse 115  Temp(Src) 98.2 F (36.8 C) (Oral)  Resp 18  SpO2 98% Physical Exam  Nursing note and vitals reviewed. Constitutional: He is oriented to person, place, and time. He appears well-developed and well-nourished. No distress.  HENT:  Head: Normocephalic and atraumatic.  Mouth/Throat: Oropharynx is clear and moist.  Eyes: Conjunctivae are normal.  Neck: Normal range of motion. Neck supple.  Cardiovascular: Regular rhythm and normal heart sounds.  Tachycardia present.   Pulmonary/Chest: Effort normal and breath sounds normal.  Abdominal: Soft. Bowel sounds are normal. There is no tenderness.  Musculoskeletal: Normal range of motion. He exhibits no edema.  Neurological: He is alert and oriented to person, place, and time.  Skin: Skin is warm and dry. He is not diaphoretic.  Psychiatric: His speech is normal and behavior is normal. He exhibits a depressed mood. He expresses suicidal ideation. He expresses no homicidal ideation. He expresses suicidal plans.    ED Course   Procedures (including critical care time)  Labs Reviewed  CBC  COMPREHENSIVE METABOLIC PANEL  ETHANOL  ACETAMINOPHEN LEVEL  SALICYLATE LEVEL  URINE RAPID DRUG SCREEN (HOSP PERFORMED)   No results found. No diagnosis found.  MDM  Patient with suicidal thoughts and plan. Hx of same. Psych labs pending. Will consult ACT. 7:19  AM Spoke with ACT team who will evaluate patient. He will be moved to pod C. 8:17 AM Medically cleared. UA with casts/few bacteria which were  present prior.  Patient to go to [facility] once a bed is available.  Trevor Mace, PA-C 05/09/13 1527

## 2013-05-09 NOTE — Progress Notes (Signed)
D: Patient in the dayroom on approach.  Patient states he was here before but does not think he was helped.  Patient states he needs to go to [facility] for long term treatment.  Patient states he has been more depressed and anxious.  Patient came to the medications window as asked for several medications but not voicing symptoms.  Patient states he is used to taking multiple pain medications and medications for sleep.  Patient was instructed that he would be given his scheduled medications first and then if the problem persist he could have additional medication.  Patient states he is having passive SI denies HI and denies AVH. A: Staff to monitor Q 15 mins for safety.  Encouragement and support offered.  Scheduled medications administered per orders. R: Patient remains safe on the unit.  Patient attended group tonight. Patient visible on the unit.  Patient taking administered medications.
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
## 2013-05-09 NOTE — Progress Notes (Signed)
Adult Psychoeducational Group Note  Date:  05/09/2013 Time:  10:07 PM  Group Topic/Focus:  Goals Group:   The focus of this group is to help patients establish daily goals to achieve during treatment and discuss how the patient can incorporate goal setting into their daily lives to aide in recovery.  Participation Level:  Active  Participation Quality:  Appropriate  Affect:  Appropriate  Cognitive:  Appropriate  Insight: Appropriate  Engagement in Group:  Engaged  Modes of Intervention:  Discussion  Additional Comments:  Pt wanted to come in a get help for himself.  Terie Purser R 05/09/2013, 10:07 PM

## 2013-05-09 NOTE — Progress Notes (Signed)
PHARMACIST - PHYSICIAN ORDER COMMUNICATION  CONCERNING: P&T Medication Policy on Herbal Medications  DESCRIPTION:  This patient's order for: Ginkgo Biloba, Coenzyme Q10 has been noted.  This product(s) is classified as an "herbal" or natural product. Due to a lack of definitive safety studies or FDA approval, nonstandard manufacturing practices, plus the potential risk of unknown drug-drug interactions while on inpatient medications, the Pharmacy and Therapeutics Committee does not permit the use of "herbal" or natural products of this type within [facility].   ACTION TAKEN: The pharmacy department is unable to verify this order at this time and your patient has been informed of this safety policy. Please reevaluate patient's clinical condition at discharge and address if the herbal or natural product(s) should be resumed at that time.
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
## 2013-05-09 NOTE — ED Notes (Signed)
PA and MD aware of lab results-- no new orders. Ok to move to [employer] [facility].

## 2013-05-09 NOTE — ED Notes (Signed)
Pt was changed into blue scrubs, environment secured, wires removed, belongings placed in bags with labels, security called to come and wand patient.  AC is aware that patient needs a Comptroller.

## 2013-05-09 NOTE — ED Notes (Signed)
teleact has completed
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
## 2013-05-09 NOTE — BH Assessment (Signed)
Patient signed the Voluntary Admission and Consent for Treatment form.  Patient also signed the Consent to release information form.  Both forms witnessed and signed by staff.

## 2013-05-10 DIAGNOSIS — Z9119 Patient's noncompliance with other medical treatment and regimen: Secondary | ICD-10-CM

## 2013-05-10 LAB — URINE CULTURE: Colony Count: NO GROWTH

## 2013-05-10 MED ORDER — LIDOCAINE 5 % EX PTCH
1.0000 | MEDICATED_PATCH | CUTANEOUS | Status: DC
  Administered 2013-05-11 – 2013-05-17 (×7): 1 via TRANSDERMAL
  Filled 2013-05-10 (×9): qty 1

## 2013-05-10 NOTE — Consult Note (Signed)
Agree with a plan.

## 2013-05-10 NOTE — Progress Notes (Signed)
Recreation Therapy Notes  Date: 08.05.2014 Time: 2:45pm Location: [address] Dayroom  Group Topic: Animal Assisted Activities  Goal Area(s) Addresses:  Patient will interact appropriately with dog team.    Behavioral Response: Engaged, Appropriate  Education: Coping Skills, Discharge Planning  Education Outcome: Acknowledges understanding   Clinical Observations/Feedback: Dog Team: Runner, broadcasting/film/video. Patient interacted appropriately with peer, dog team, LRT and MHT. Patient asked appropriate questions about Eula Fried and his training.    Marykay Lex Helene Bernstein, LRT/CTRS  Jearl Klinefelter 05/10/2013 4:26 PM

## 2013-05-10 NOTE — Progress Notes (Signed)
D: Patient denies HI and A/V hallucinations;patient reports constant thoughts of SI patient reports sleep is poor; reports appetite is improving; reports energy level is low ; reports ability to pay attention is poor; rates depression as 10/10; rates hopelessness 10/10; rates anxiety as 6/10; reports that he just needs to take it one day at a time;  A: Monitored q 15 minutes; patient encouraged to attend groups; patient educated about medications; patient given medications per physician orders; patient encouraged to express feelings and/or concerns  R: Patient is laughing and joking and engaging; patient is appropriate  patient's interaction with staff and peers is appropriate; patient was able to set goal to talk with staff 1:1 when having feelings of SI; patient is taking medications as prescribed and tolerating medications; patient is attending some groups and engaging
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
## 2013-05-10 NOTE — Progress Notes (Signed)
Patient attended 0900 hr RN orientation group.The focus of this group is to educate the patient on the purpose and policies of crisis stabilization and provide a format to answer questions about their admission.  The group details unit policies and expectations of patients while admitted. Patient actively participated and patient was engaging and able to verbalize a personal goal for today

## 2013-05-10 NOTE — [facility] Group Notes (Signed)
[facility] LCSW Group Therapy  05/10/2013  1:15 PM   Type of Therapy:  Group Therapy  Participation Level:  Active  Participation Quality:  Appropriate and Attentive  Affect:  Appropriate, Depressed and Flat  Cognitive:  Alert and Appropriate  Insight:  Developing/Improving and Engaged  Engagement in Therapy:  Developing/Improving and Engaged  Modes of Intervention:  Clarification, Confrontation, Discussion, Education, Exploration, Limit-setting, Orientation, Problem-solving, Rapport Building, Dance movement psychotherapist, Socialization and Support  Summary of Progress/Problems: The topic for group therapy was feelings about diagnosis.  Pt actively participated in group discussion on their past and current diagnosis and how they feel towards this.  Pt also identified how society and family members judge them, based on their diagnosis as well as stereotypes and stigmas.   Pt shared that he has been diagnosed with depression long ago and has accepted that it is a chemical imbalance and that it is okay to get help.  Pt states that he is aware of the changes he needs to make to get well and just needs to follow through and do it.  Pt actively participated and was engaged in group discussion.    Jeffrey [name], LCSWA 05/10/2013 2:56 PM

## 2013-05-10 NOTE — Progress Notes (Signed)
Adult Psychoeducational Group Note  Date:  05/10/2013 Time:  1:22 PM  Group Topic/Focus:  Recovery Goals:   The focus of this group is to identify appropriate goals for recovery and establish a plan to achieve them.  Participation Level:  Active  Participation Quality:  Appropriate and Attentive  Affect:  Appropriate  Cognitive:  Appropriate  Insight: Appropriate and Good  Engagement in Group:  Developing/Improving  Modes of Intervention:  Discussion, Education, Limit-setting, Socialization and Support  Additional Comments:  Keishawn attended group and shared during group. Patient define recovery in own terms, and gave an example of a time in patient life when things were going well and then became stressful and loss of control of situation. Patient completed recovery goals worksheet and discussed two changes in patient life where changes could be made and set a goal.  Karleen Hampshire Brittini 05/10/2013, 1:22 PM

## 2013-05-10 NOTE — Progress Notes (Signed)
Adult Psychoeducational Group Note  Date:  05/10/2013 Time:  9:40 PM  Group Topic/Focus:  Wrap-Up Group:   The focus of this group is to help patients review their daily goal of treatment and discuss progress on daily workbooks.  Participation Level:  Active  Participation Quality:  Appropriate, Attentive and Sharing  Affect:  Blunted and Flat  Cognitive:  Alert and Appropriate  Insight: Good  Engagement in Group:  Developing/Improving and Engaged  Modes of Intervention:  Support  Additional Comments:  Pt stated that his goal was to "get out of the environment" that he currently lives in and gets "stabilized on his meds."  Thayer Jew 05/10/2013, 9:40 PM
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
## 2013-05-11 MED ORDER — TUBERCULIN PPD 5 UNIT/0.1ML ID SOLN
5.0000 [IU] | Freq: Once | INTRADERMAL | Status: AC
  Administered 2013-05-11: 5 [IU] via INTRADERMAL

## 2013-05-11 MED ORDER — NONFORMULARY OR COMPOUNDED ITEM
1.0000 "application " | Freq: Once | Status: DC
  Filled 2013-05-11: qty 1
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
## 2013-05-11 NOTE — Tx Team (Signed)
Interdisciplinary Treatment Plan Update (Adult)  Date: 05/11/2013  Time Reviewed:  9:45 AM  Progress in Treatment: Attending groups: Yes Participating in groups:  Yes Taking medication as prescribed:  Yes Tolerating medication:  Yes Family/Significant othe contact made: CSW assessing  Patient understands diagnosis:  Yes Discussing patient identified problems/goals with staff:  Yes Medical problems stabilized or resolved:  Yes Denies suicidal/homicidal ideation: Yes Issues/concerns per patient self-inventory:  Yes Other:  New problem(s) identified: N/A  Discharge Plan or Barriers: CSW assessing for appropriate referrals. Pt can follow up at [facility] for medication management and therapy.  CSW will assess for placement.    Reason for Continuation of Hospitalization: Anxiety Depression Medication Stabilization  Comments: N/A  Estimated length of stay: 3-5 days  For review of initial/current patient goals, please see plan of care.  Attendees: Patient:     Family:     Physician:  Dr. Javier Glazier 05/11/2013 10:04 AM   Nursing:   Burnetta Sabin, RN 05/11/2013 10:04 AM   Clinical Social Worker:  Reyes Ivan, LCSWA 05/11/2013 10:04 AM   Other: Verne Spurr, PA 05/11/2013 10:04 AM   Other:  Lowella Grip, RN 05/11/2013 10:04 AM   Other:  Juline Patch, LCSW 05/11/2013 10:04 AM   Other:     Other:    Other:    Other:    Other:    Other:    Other:     Scribe for Treatment Team:   Carmina Miller, 05/11/2013 10:04 AM

## 2013-05-11 NOTE — ED Provider Notes (Signed)
Medical screening examination/treatment/procedure(s) were performed by non-physician practitioner and as supervising physician I was immediately available for consultation/collaboration.  Aubreyana Saltz R. Thorvald Orsino, MD 05/11/13 1504 

## 2013-05-11 NOTE — [facility] Group Notes (Signed)
[name] LCSW Aftercare Discharge Planning Group Note   05/11/2013 8:45 AM  Participation Quality:  Alert and Appropriate   Mood/Affect:  Appropriate, Flat and Depressed  Depression Rating:  10  Anxiety Rating:  6  Thoughts of Suicide:  Pt endorses SI/HI  Will you contract for safety?   Yes  Current AVH: Pt denies  Plan for Discharge/Comments:  Pt attended discharge planning group and actively participated in group.  CSW provided pt with today's workbook.  Pt states that he doesn't know what his discharge plans are at this time.  Pt states that he can return to the home in [place] but doesn't want to, due to this being a stressful living environment.  Pt can follow up at [facility] for medication management and therapy.  CSW will assess for appropriate referrals.  No further needs voiced by pt at this time.    Transportation Means: Pt reports access to transportation  Supports: No supports mentioned at this time  Jeffrey [name], LCSWA 05/11/2013 9:36 AM

## 2013-05-11 NOTE — Progress Notes (Signed)
Psychoeducational Group Note  Date:  05/11/2013 Time:  1100  Group Topic/Focus: Personal Choices and Values:   The focus of this group is to help patients assess and explore the importance of values in their lives, how their values affect their decisions, how they express their values and what opposes their expression.  Participation Level: Did Not Attend  Participation Quality:  Not Applicable  Affect:  Not Applicable  Cognitive:  Not Applicable  Insight:  Not Applicable  Engagement in Group: Not Applicable  Additional Comments:  Patient did not attend group, patient remained in bed.  Karleen Hampshire Brittini 05/11/2013, 6:27 PM

## 2013-05-11 NOTE — Progress Notes (Signed)
Adult Psychoeducational Group Note  Date:  05/11/2013 Time:  9:23 PM  Group Topic/Focus:  Wrap-Up Group:   The focus of this group is to help patients review their daily goal of treatment and discuss progress on daily workbooks.  Participation Level:  Active  Participation Quality:  Appropriate  Affect:  Appropriate  Cognitive:  Alert  Insight: Appropriate  Engagement in Group:  Engaged  Modes of Intervention:  Discussion  Additional Comments:  Pt stated that he had an ok day. Pt also states that he wants to go back to [place] and move out of the toxic environment that he is living in. One thing that he is taking away from this experience is doing knowing when it is time to get help.  Kaleen Odea R 05/11/2013, 9:23 PM

## 2013-05-11 NOTE — [facility] Suicide Risk Assessment (Signed)
[facility] INPATIENT:  Family/Significant Other Suicide Prevention Education  Suicide Prevention Education:  Education Completed; Jeffrey [name] - friend [phone]),  (name of family member/significant other) has been identified by the patient as the family member/significant other with whom the patient will be residing, and identified as the person(s) who will aid the patient in the event of a mental health crisis (suicidal ideations/suicide attempt).  With written consent from the patient, the family member/significant other has been provided the following suicide prevention education, prior to the and/or following the discharge of the patient.  The suicide prevention education provided includes the following:  Suicide risk factors  Suicide prevention and interventions  National Suicide Hotline telephone number  [name] [place] assessment telephone number  [facility] Emergency Assistance 911  [facility] and/or Residential Mobile Crisis Unit telephone number  Request made of family/significant other to:  Remove weapons (e.g., guns, rifles, knives), all items previously/currently identified as safety concern.    Remove drugs/medications (over-the-counter, prescriptions, illicit drugs), all items previously/currently identified as a safety concern.  The family member/significant other verbalizes understanding of the suicide prevention education information provided.  The family member/significant other agrees to remove the items of safety concern listed above. [name] has group homes in [name] and has an opening at a home for pt.  [name] is requesting a TB test and FL2 faxed to her today.  CSW will work on placement for pt today.    Jeffrey [name] 05/11/2013, 10:50 AM

## 2013-05-11 NOTE — [facility] Group Notes (Signed)
[facility] LCSW Group Therapy  Emotional Regulation 1:15 - 2: 30 PM        05/11/2013    Type of Therapy:  Group Therapy  Participation Level:  Appropriate  Participation Quality:  Appropriate  Affect:  Appropriate  Cognitive:  Attentive Appropriate  Insight:  Engaged  Engagement in Therapy:  Engaged  Modes of Intervention:  Discussion Exploration Problem-Solving Supportive  Summary of Progress/Problems:  Group topic was emotional regulations.  Patient participated in the discussion and was able to identify anger and frustration emotions he needs to regulated.  Patient was able to identify approprite coping skills.  Wynn Banker 05/11/2013

## 2013-05-11 NOTE — Progress Notes (Signed)
[facility] MD Progress Note  05/11/2013 1:07 PM Jeffrey [name]  MRN:  [id number] Subjective:  Patient has been diagnosed with major depressive disorder, chronic pain syndrome, cocaine abuse and suicidal ideation. Patient has been taking his medications as prescribed and has no reported adverse effects. Patient reported he has a second dose of Vistaril for sleep last night. Patient has been participating in the unit activities including group therapy. Patient rated depression 8/10 and  and osseous suicidal ideation with vague plans but no homicidal ideation at this time.patient does not need his living situation and wishes to be placed in [facility] group home.   Diagnosis:  Axis I: Major Depression, Recurrent severe and Chronic pain syndrome and cocaine abuse  ADL's:  Impaired  Sleep: Poor  Appetite:  Fair  Suicidal Ideation:  Patient is positive for the suicidal ideation with vague suicidal plan and cannot contract for safety at this time  Homicidal Ideation:  Patient denied homicidal thoughts intentions or plans at this time  AEB (as evidenced by):  Psychiatric Specialty Exam: ROS  Blood pressure 117/79, pulse 76, temperature 97.8 F (36.6 C), temperature source Oral, resp. rate 20, height 5\' 11"  (1.803 m), weight 98.431 kg (217 lb).Body mass index is 30.28 kg/(m^2).  General Appearance: Disheveled and Guarded  Eye Contact::  Minimal  Speech:  Clear and Coherent and Normal Rate  Volume:  Decreased  Mood:  Anxious, Depressed, Hopeless and Worthless  Affect:  Constricted, Depressed and Flat  Thought Process:  Goal Directed and Intact  Orientation:  Full (Time, Place, and Person)  Thought Content:  WDL  Suicidal Thoughts:  Yes.  with intent/plan  Homicidal Thoughts:  No  Memory:  Immediate;   Fair  Judgement:  Impaired  Insight:  Lacking  Psychomotor Activity:  Psychomotor Retardation  Concentration:  Fair  Recall:  Fair  Akathisia:  NA  Handed:  Right  AIMS (if indicated):      Assets:  Communication Skills Desire for Improvement Resilience  Sleep:  Number of Hours: 4.5   Current Medications: Current Facility-Administered Medications  Medication Dose Route Frequency Provider Last Rate Last Dose  . cyclobenzaprine (FLEXERIL) tablet 5 mg  5 mg Oral TID PRN Kerry Hough, PA-C      . desvenlafaxine (PRISTIQ) 24 hr tablet 100 mg  100 mg Oral Daily Kerry Hough, PA-C   100 mg at 05/11/13 0817  . gabapentin (NEURONTIN) tablet 800 mg  800 mg Oral TID Kerry Hough, PA-C   800 mg at 05/11/13 1200  . hydrOXYzine (ATARAX/VISTARIL) tablet 50 mg  50 mg Oral QHS PRN,MR X 1 Larena Sox, MD   50 mg at 05/10/13 2326  . lidocaine (LIDODERM) 5 % 1 patch  1 patch Transdermal Q24H Kerry Hough, PA-C   1 patch at 05/11/13 [id number]  . loperamide (IMODIUM) capsule 2 mg  2 mg Oral QID PRN Kerry Hough, PA-C      . loratadine (CLARITIN) tablet 10 mg  10 mg Oral Daily Kerry Hough, PA-C   10 mg at 05/11/13 0818  . magnesium hydroxide (MILK OF MAGNESIA) suspension 30 mL  30 mL Oral Daily PRN Larena Sox, MD      . multivitamin with minerals tablet 1 tablet  1 tablet Oral Daily Kerry Hough, PA-C   1 tablet at 05/11/13 0818  . [START ON 05/13/2013] NONFORMULARY OR COMPOUNDED ITEM 1 application  1 application Topical Once Nehemiah Settle, MD      .  pantoprazole (PROTONIX) EC tablet 40 mg  40 mg Oral Daily Kerry Hough, PA-C   40 mg at 05/11/13 0818  . propranolol (INDERAL) tablet 10 mg  10 mg Oral Daily Kerry Hough, PA-C   10 mg at 05/11/13 0818  . SUMAtriptan (IMITREX) tablet 50 mg  50 mg Oral Q2H PRN Kerry Hough, PA-C      . traMADol Janean Sark) tablet 50 mg  50 mg Oral QHS Kerry Hough, PA-C   50 mg at 05/10/13 2203  . traZODone (DESYREL) tablet 150 mg  150 mg Oral QHS Kerry Hough, PA-C   150 mg at 05/10/13 2203  . tuberculin injection 5 Units  5 Units Intradermal Once Verne Spurr, PA-C        Lab Results: No results found for this or any  previous visit (from the past 48 hour(s)).  Physical Findings: AIMS: Facial and Oral Movements Muscles of Facial Expression: None, normal Lips and Perioral Area: None, normal Jaw: None, normal Tongue: None, normal,Extremity Movements Upper (arms, wrists, hands, fingers): None, normal Lower (legs, knees, ankles, toes): None, normal, Trunk Movements Neck, shoulders, hips: None, normal, Overall Severity Severity of abnormal movements (highest score from questions above): None, normal Incapacitation due to abnormal movements: None, normal Patient's awareness of abnormal movements (rate only patient's report): No Awareness, Dental Status Current problems with teeth and/or dentures?: No Does patient usually wear dentures?: Yes (partials on upper and lower)  CIWA:    COWS:     Treatment Plan Summary: Daily contact with patient to assess and evaluate symptoms and progress in treatment Medication management  Plan: Treatment Plan/Recommendations:  1. Admit for crisis management and stabilization. 2. Medication management to reduce current symptoms to base line and improve the patient's overall level of functioning. Continue pristiq for depression, trazodone for insomnia and other medication for medical illness including chronic pain syndrome 3. Treat health problems as indicated. 4. Develop treatment plan to decrease risk of relapse upon discharge and to reduce the need for readmission. 5. Psycho-social education regarding relapse prevention and self care. 6. Health care follow up as needed for medical problems. 7. Restart home medications where appropriate.   Medical Decision Making Problem Points:  Established problem, worsening (2), Review of last therapy session (1) and Review of psycho-social stressors (1) Data Points:  Review or order clinical lab tests (1) Review or order medicine tests (1) Review of medication regiment & side effects (2) Review of new medications or change in  dosage (2)  I certify that inpatient services furnished can reasonably be expected to improve the patient's condition.   Nehemiah Settle., M.D.  05/11/2013, 1:07 PM

## 2013-05-11 NOTE — [facility] Group Notes (Signed)
[facility] Group Notes:  (Nursing/MHT/Case Management/Adjunct)  Date:  05/11/2013  Time:  11:03 AM  Type of Therapy:  Nurse Education  Participation Level:  Did Not Attend  Participation Quality:    Affect:    Cognitive:    Insight:    Engagement in Group:    Modes of Intervention:    Summary of Progress/Problems:  Jeffrey [name] 05/11/2013, 11:03 AM

## 2013-05-11 NOTE — Progress Notes (Signed)
Recreation Therapy Notes  Date: 08.06.2014 Time: 3:00pm Location: [address] Dayroom  Group Topic: Communication, Team Building, Problem Solving  Goal Area(s) Addresses:  Patient will effectively work in groups to accomplish shared goal. Patient will verbalize skills used in to make activity successful. Patient will verbalize benefit of using skills in a healthy way. Patient will relate skills used during group session to personal development.   Behavioral Response: Engaged, Appropriate, Supportive, Encouraging   Intervention: Team Activity  Activity: Landing Pad. Patients were divided into two groups. Patients were given 12 drinking straws and a length of masking tape. Using supplies provided patients were asked to build a freestanding structure to catch a plastic golf ball dropped from approximately 4 feet.   Education: Customer service manager, Discharge Planning, Relapse Prevention  Education Outcome: Acknowledges Understanding.   Clinical Observations/Feedback: Patient actively participated in group session. Patient did not spontaneously contribute to opening discussion, however patient appeared to actively listen as he maintained appropriate eye contact with person speaking. Patient actively participated in group activity, working well with peers in group, offering suggestions and assisting with building of group landing pad. Patient group was ultimately successful at building a structure to catch the plastic golf ball, upon being successful patient team congratulated each other with bright smiles and high fives. Patient actively contributed to wrap up discussion identifying team work as a skill necessary to make activity successful. Patient explained that if his group had not communicated well and worked well together they would not have been able to complete the task requested. Patient verbalized ability to use skills used in group session post d/c to positively make change in his life.   Marykay Lex  Avery Eustice, LRT/CTRS  Antasia Haider L 05/11/2013 4:19 PM

## 2013-05-12 LAB — CBC WITH DIFFERENTIAL/PLATELET
Basophils Absolute: 0 10*3/uL (ref 0.0–0.1)
Eosinophils Relative: 3 % (ref 0–5)
HCT: 41.6 % (ref 39.0–52.0)
Hemoglobin: 14 g/dL (ref 13.0–17.0)
Lymphocytes Relative: 51 % — ABNORMAL HIGH (ref 12–46)
Lymphs Abs: 3.3 10*3/uL (ref 0.7–4.0)
MCV: 89.5 fL (ref 78.0–100.0)
Monocytes Absolute: 0.5 10*3/uL (ref 0.1–1.0)
Monocytes Relative: 8 % (ref 3–12)
Neutro Abs: 2.5 10*3/uL (ref 1.7–7.7)
RBC: 4.65 MIL/uL (ref 4.22–5.81)
WBC: 6.4 10*3/uL (ref 4.0–10.5)

## 2013-05-12 MED ORDER — TRAZODONE HCL 100 MG PO TABS
200.0000 mg | ORAL_TABLET | Freq: Every day | ORAL | Status: DC
  Administered 2013-05-12 – 2013-05-16 (×5): 200 mg via ORAL
  Filled 2013-05-12 (×3): qty 2
  Filled 2013-05-12: qty 6
  Filled 2013-05-12 (×3): qty 2

## 2013-05-12 NOTE — Progress Notes (Signed)
Patient did not attend 0900 orientation nurse group.  Patient was asleep in bed.

## 2013-05-12 NOTE — Progress Notes (Signed)
Patient ID: Jeffrey [name], male   DOB: Apr 14, 1961, 52 y.o.   MRN: [id number] [facility] MD Progress Note  05/12/2013 12:29 PM Jeffrey [name]  MRN:  [id number]  Subjective:  Patient was appeared lying on his bed, complaining of feeling depressed, rated depression 8/10 and and anxiety about placement and suicidal ideation with vague plans but no homicidal ideation at this time. Patient does not return to his living situation and wishes to be placed in [facility] group home. He has requested to adjust his medication for better sleep.  Diagnosis:  Axis I: Major Depression, Recurrent severe and Chronic pain syndrome and cocaine abuse  ADL's:  Impaired  Sleep: Poor  Appetite:  Fair  Suicidal Ideation:  Patient is positive for the suicidal ideation with vague suicidal plan and cannot contract for safety in hospital  Homicidal Ideation:  Patient denied homicidal thoughts intentions or plans at this time  AEB (as evidenced by):  Psychiatric Specialty Exam: ROS  Blood pressure 115/77, pulse 66, temperature 98 F (36.7 C), temperature source Oral, resp. rate 16, height 5\' 11"  (1.803 m), weight 98.431 kg (217 lb).Body mass index is 30.28 kg/(m^2).  General Appearance: Disheveled and Guarded  Eye Contact::  Minimal  Speech:  Clear and Coherent and Normal Rate  Volume:  Decreased  Mood:  Anxious, Depressed, Hopeless and Worthless  Affect:  Constricted, Depressed and Flat  Thought Process:  Goal Directed and Intact  Orientation:  Full (Time, Place, and Person)  Thought Content:  WDL  Suicidal Thoughts:  Yes.  with intent/plan  Homicidal Thoughts:  No  Memory:  Immediate;   Fair  Judgement:  Impaired  Insight:  Lacking  Psychomotor Activity:  Psychomotor Retardation  Concentration:  Fair  Recall:  Fair  Akathisia:  NA  Handed:  Right  AIMS (if indicated):     Assets:  Communication Skills Desire for Improvement Resilience  Sleep:  Number of Hours: 6.75   Current Medications: Current  Facility-Administered Medications  Medication Dose Route Frequency Provider Last Rate Last Dose  . cyclobenzaprine (FLEXERIL) tablet 5 mg  5 mg Oral TID PRN Kerry Hough, PA-C      . desvenlafaxine (PRISTIQ) 24 hr tablet 100 mg  100 mg Oral Daily Kerry Hough, PA-C   100 mg at 05/12/13 0818  . gabapentin (NEURONTIN) tablet 800 mg  800 mg Oral TID Kerry Hough, PA-C   800 mg at 05/12/13 0818  . hydrOXYzine (ATARAX/VISTARIL) tablet 50 mg  50 mg Oral QHS PRN,MR X 1 Larena Sox, MD   50 mg at 05/11/13 2231  . lidocaine (LIDODERM) 5 % 1 patch  1 patch Transdermal Q24H Kerry Hough, PA-C   1 patch at 05/12/13 [id number]  . loperamide (IMODIUM) capsule 2 mg  2 mg Oral QID PRN Kerry Hough, PA-C      . loratadine (CLARITIN) tablet 10 mg  10 mg Oral Daily Kerry Hough, PA-C   10 mg at 05/12/13 0818  . magnesium hydroxide (MILK OF MAGNESIA) suspension 30 mL  30 mL Oral Daily PRN Larena Sox, MD      . multivitamin with minerals tablet 1 tablet  1 tablet Oral Daily Kerry Hough, PA-C   1 tablet at 05/12/13 0818  . [START ON 05/13/2013] NONFORMULARY OR COMPOUNDED ITEM 1 application  1 application Topical Once Nehemiah Settle, MD      . pantoprazole (PROTONIX) EC tablet 40 mg  40 mg Oral Daily Kerry Hough,  PA-C   40 mg at 05/12/13 0818  . propranolol (INDERAL) tablet 10 mg  10 mg Oral Daily Kerry Hough, PA-C   10 mg at 05/12/13 0818  . SUMAtriptan (IMITREX) tablet 50 mg  50 mg Oral Q2H PRN Kerry Hough, PA-C      . traMADol Janean Sark) tablet 50 mg  50 mg Oral QHS Kerry Hough, PA-C   50 mg at 05/11/13 2112  . traZODone (DESYREL) tablet 150 mg  150 mg Oral QHS Kerry Hough, PA-C   150 mg at 05/11/13 2112  . tuberculin injection 5 Units  5 Units Intradermal Once Verne Spurr, PA-C   5 Units at 05/11/13 1100    Lab Results:  Results for orders placed during the hospital encounter of 05/09/13 (from the past 48 hour(s))  CBC WITH DIFFERENTIAL     Status: Abnormal    Collection Time    05/12/13  6:23 AM      Result Value Range   WBC 6.4  4.0 - 10.5 K/uL   RBC 4.65  4.22 - 5.81 MIL/uL   Hemoglobin 14.0  13.0 - 17.0 g/dL   HCT 57.8  46.9 - 62.9 %   MCV 89.5  78.0 - 100.0 fL   MCH 30.1  26.0 - 34.0 pg   MCHC 33.7  30.0 - 36.0 g/dL   RDW 52.8  41.3 - 24.4 %   Platelets 146 (*) 150 - 400 K/uL   Neutrophils Relative % 38 (*) 43 - 77 %   Neutro Abs 2.5  1.7 - 7.7 K/uL   Lymphocytes Relative 51 (*) 12 - 46 %   Lymphs Abs 3.3  0.7 - 4.0 K/uL   Monocytes Relative 8  3 - 12 %   Monocytes Absolute 0.5  0.1 - 1.0 K/uL   Eosinophils Relative 3  0 - 5 %   Eosinophils Absolute 0.2  0.0 - 0.7 K/uL   Basophils Relative 1  0 - 1 %   Basophils Absolute 0.0  0.0 - 0.1 K/uL   Comment: Performed at [facility]    Physical Findings: AIMS: Facial and Oral Movements Muscles of Facial Expression: None, normal Lips and Perioral Area: None, normal Jaw: None, normal Tongue: None, normal,Extremity Movements Upper (arms, wrists, hands, fingers): None, normal Lower (legs, knees, ankles, toes): None, normal, Trunk Movements Neck, shoulders, hips: None, normal, Overall Severity Severity of abnormal movements (highest score from questions above): None, normal Incapacitation due to abnormal movements: None, normal Patient's awareness of abnormal movements (rate only patient's report): No Awareness, Dental Status Current problems with teeth and/or dentures?: No Does patient usually wear dentures?: Yes (partials on upper and lower)  CIWA:    COWS:     Treatment Plan Summary: Daily contact with patient to assess and evaluate symptoms and progress in treatment Medication management  Plan: Treatment Plan/Recommendations:  1. Admit for crisis management and stabilization. 2. Medication management to reduce current symptoms to base line and improve the patient's overall level of functioning. Continue pristiq for depression and other medication for  medical illness including chronic pain syndrome. Increase Trazodone 200 mg Qhs for mood and insomnia.  3. Treat health problems as indicated. 4. Develop treatment plan to decrease risk of relapse upon discharge and to reduce the need for readmission. 5. Psycho-social education regarding relapse prevention and self care. 6. Health care follow up as needed for medical problems. 7. Restart home medications where appropriate. 8. Case manager working on his  disposition plans and placement   Medical Decision Making Problem Points:  Established problem, worsening (2), Review of last therapy session (1) and Review of psycho-social stressors (1) Data Points:  Review or order clinical lab tests (1) Review or order medicine tests (1) Review of medication regiment & side effects (2) Review of new medications or change in dosage (2)  I certify that inpatient services furnished can reasonably be expected to improve the patient's condition.   Nehemiah Settle., M.D.  05/12/2013, 12:29 PM

## 2013-05-12 NOTE — Progress Notes (Addendum)
D   Pt reports feeling depressed but he contracts for safety  He did not go to [facility] group and has been minimally interactive with others today    Pt continues to report chronic neck pain but said it is always there even when he takes a medication for it    Pt is pleasant and cooperative but can be irritable at times  A   Verbal support given   Medications administered and effectiveness monitored   Q 15 min checks R   Pt safe at present

## 2013-05-12 NOTE — Progress Notes (Signed)
Adult Psychoeducational Group Note  Date:  05/12/2013 Time:  11:00AM Group Topic/Focus:  Self Esteem Action Plan:   The focus of this group is to help patients create a plan to continue to build self-esteem after discharge.  Participation Level:  Did Not Attend   Additional Comments: Pt didn't attend group.   Jeffrey [name] D 05/12/2013, 11:32 AM

## 2013-05-12 NOTE — Progress Notes (Signed)
D:  Patient up and present in the milieu part of the day.  He did sleep through the morning groups, but has been attending and engaged this afternoon.  He continues to endorse depression, hopelessness, and suicidal thoughts.    A:  Medications given as prescribed.  Encouraged participation in all groups.   R:  Patient agrees to seek out staff if feeling unsafe.  Has been up some today and interacts well with staff and peers when he is up.  Affect is flat/blunted.  Safety is maintained with 15 minute checks.

## 2013-05-12 NOTE — [facility] Group Notes (Signed)
.  [facility] LCSW Group Therapy  [facility] 1:15 - 2:30 PM  05/12/2013   Type of Therapy:  Group Therapy  Participation Level:  Minimal  Participation Quality:  Attentive  Affect:  Appropriate  Cognitive:  Appropriate  Insight:  Developing/Improving and Engaged  Engagement in Therapy:  Developing/Improving Engaged  Modes of Intervention:  Discussion, Education, Exploration, Problem-Solving, Rapport Building, Support   Summary of Progress/Problems:  Patient listened attentively to speaker from [employer] [facility]. He nodded in agreement to comments but did not engage in discussion.  Jeffrey [name] 05/12/2013

## 2013-05-13 NOTE — Progress Notes (Signed)
Patient ID: Jeffrey [name], male   DOB: 03/03/1961, 52 y.o.   MRN: [id number]  D:  Pt +ve  Passive [name]/HI, but contracts for safety. Pt denies AVH. Pt is pleasant and cooperative. Pt goal for today is to work on her communication with her mother. Pt states " I'm looking forward to going home, I was in a Toxic environment before, but I'm not going to have that in [place]". 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    A: Pt was offered support and encouragement. Pt was given scheduled medications. Pt was encourage to attend groups. Q 15 minute checks were done for safety.    R:Pt attends groups and interacts well with peers and staff. Pt is taking medication.Pt receptive to treatment and safety maintained on unit.

## 2013-05-13 NOTE — Progress Notes (Signed)
Patient ID: Jeffrey [name], male   DOB: November 01, 1960, 52 y.o.   MRN: [id number] [facility] MD Progress Note  05/13/2013 12:20 PM JAIMES ECKERT  MRN:  [id number]  Subjective:  Patient stated that he is feeling some what better, slept well last night and stated that his medication seeks like working for him but continue to have on and off suicidal ideation but denied suicidal intention or plans. Patient does not want return to his living situation and wishes to be placed in [place] group home.   Diagnosis:  Axis I: Major Depression, Recurrent severe and Chronic pain syndrome and cocaine abuse  ADL's:  Impaired  Sleep: Poor  Appetite:  Fair  Suicidal Ideation:  Patient is positive for the suicidal ideation with vague suicidal plan and cannot contract for safety in hospital  Homicidal Ideation:  Patient denied homicidal thoughts intentions or plans at this time  AEB (as evidenced by):  Psychiatric Specialty Exam: ROS  Blood pressure 111/69, pulse 77, temperature 97.8 F (36.6 C), temperature source Oral, resp. rate 18, height 5\' 11"  (1.803 m), weight 98.431 kg (217 lb).Body mass index is 30.28 kg/(m^2).  General Appearance: Disheveled and Guarded  Eye Contact::  Minimal  Speech:  Clear and Coherent and Normal Rate  Volume:  Decreased  Mood:  Anxious, Depressed, Hopeless and Worthless  Affect:  Constricted, Depressed and Flat  Thought Process:  Goal Directed and Intact  Orientation:  Full (Time, Place, and Person)  Thought Content:  WDL  Suicidal Thoughts:  Yes.  with intent/plan  Homicidal Thoughts:  No  Memory:  Immediate;   Fair  Judgement:  Impaired  Insight:  Lacking  Psychomotor Activity:  Psychomotor Retardation  Concentration:  Fair  Recall:  Fair  Akathisia:  NA  Handed:  Right  AIMS (if indicated):     Assets:  Communication Skills Desire for Improvement Resilience  Sleep:  Number of Hours: 5.75   Current Medications: Current Facility-Administered Medications   Medication Dose Route Frequency Provider Last Rate Last Dose  . cyclobenzaprine (FLEXERIL) tablet 5 mg  5 mg Oral TID PRN Kerry Hough, PA-C      . desvenlafaxine (PRISTIQ) 24 hr tablet 100 mg  100 mg Oral Daily Kerry Hough, PA-C   100 mg at 05/13/13 0865  . gabapentin (NEURONTIN) tablet 800 mg  800 mg Oral TID Kerry Hough, PA-C   800 mg at 05/13/13 7846  . hydrOXYzine (ATARAX/VISTARIL) tablet 50 mg  50 mg Oral QHS PRN,MR X 1 Larena Sox, MD   50 mg at 05/12/13 2253  . lidocaine (LIDODERM) 5 % 1 patch  1 patch Transdermal Q24H Kerry Hough, PA-C   1 patch at 05/13/13 [phone]  . loperamide (IMODIUM) capsule 2 mg  2 mg Oral QID PRN Kerry Hough, PA-C      . loratadine (CLARITIN) tablet 10 mg  10 mg Oral Daily Kerry Hough, PA-C   10 mg at 05/13/13 5284  . magnesium hydroxide (MILK OF MAGNESIA) suspension 30 mL  30 mL Oral Daily PRN Larena Sox, MD      . multivitamin with minerals tablet 1 tablet  1 tablet Oral Daily Kerry Hough, PA-C   1 tablet at 05/13/13 1324  . NONFORMULARY OR COMPOUNDED ITEM 1 application  1 application Topical Once Nehemiah Settle, MD      . pantoprazole (PROTONIX) EC tablet 40 mg  40 mg Oral Daily Kerry Hough, PA-C   40 mg  at 05/13/13 2956  . propranolol (INDERAL) tablet 10 mg  10 mg Oral Daily Kerry Hough, PA-C   10 mg at 05/13/13 2130  . SUMAtriptan (IMITREX) tablet 50 mg  50 mg Oral Q2H PRN Kerry Hough, PA-C      . traMADol Janean Sark) tablet 50 mg  50 mg Oral QHS Kerry Hough, PA-C   50 mg at 05/12/13 2143  . traZODone (DESYREL) tablet 200 mg  200 mg Oral QHS Nehemiah Settle, MD   200 mg at 05/12/13 2143    Lab Results:  Results for orders placed during the hospital encounter of 05/09/13 (from the past 48 hour(s))  CBC WITH DIFFERENTIAL     Status: Abnormal   Collection Time    05/12/13  6:23 AM      Result Value Range   WBC 6.4  4.0 - 10.5 K/uL   RBC 4.65  4.22 - 5.81 MIL/uL   Hemoglobin 14.0  13.0 -  17.0 g/dL   HCT 86.5  78.4 - 69.6 %   MCV 89.5  78.0 - 100.0 fL   MCH 30.1  26.0 - 34.0 pg   MCHC 33.7  30.0 - 36.0 g/dL   RDW 29.5  28.4 - 13.2 %   Platelets 146 (*) 150 - 400 K/uL   Neutrophils Relative % 38 (*) 43 - 77 %   Neutro Abs 2.5  1.7 - 7.7 K/uL   Lymphocytes Relative 51 (*) 12 - 46 %   Lymphs Abs 3.3  0.7 - 4.0 K/uL   Monocytes Relative 8  3 - 12 %   Monocytes Absolute 0.5  0.1 - 1.0 K/uL   Eosinophils Relative 3  0 - 5 %   Eosinophils Absolute 0.2  0.0 - 0.7 K/uL   Basophils Relative 1  0 - 1 %   Basophils Absolute 0.0  0.0 - 0.1 K/uL   Comment: Performed at [facility]    Physical Findings: AIMS: Facial and Oral Movements Muscles of Facial Expression: None, normal Lips and Perioral Area: None, normal Jaw: None, normal Tongue: None, normal,Extremity Movements Upper (arms, wrists, hands, fingers): None, normal Lower (legs, knees, ankles, toes): None, normal, Trunk Movements Neck, shoulders, hips: None, normal, Overall Severity Severity of abnormal movements (highest score from questions above): None, normal Incapacitation due to abnormal movements: None, normal Patient's awareness of abnormal movements (rate only patient's report): No Awareness, Dental Status Current problems with teeth and/or dentures?: No Does patient usually wear dentures?: Yes (partials on upper and lower)  CIWA:    COWS:     Treatment Plan Summary: Daily contact with patient to assess and evaluate symptoms and progress in treatment Medication management  Plan: Treatment Plan/Recommendations:  1. Admit for crisis management and stabilization. 2. Medication management to reduce current symptoms to base line and improve the patient's overall level of functioning. Continue pristiq for depression and other medication for medical illness including chronic pain syndrome. Increase Trazodone 200 mg Qhs for mood and insomnia.  3. Treat health problems as indicated. 4. Develop  treatment plan to decrease risk of relapse upon discharge and to reduce the need for readmission. 5. Psycho-social education regarding relapse prevention and self care. 6. Health care follow up as needed for medical problems. 7. Restart home medications where appropriate. 8. Case manager working on his disposition plans and placement 9. Tentative discharge on Monday   Medical Decision Making Problem Points:  Established problem, worsening (2), Review of last therapy session (  1) and Review of psycho-social stressors (1) Data Points:  Review or order clinical lab tests (1) Review or order medicine tests (1) Review of medication regiment & side effects (2) Review of new medications or change in dosage (2)  I certify that inpatient services furnished can reasonably be expected to improve the patient's condition.   Nehemiah Settle., M.D.  05/13/2013, 12:20 PM

## 2013-05-13 NOTE — Progress Notes (Signed)
[facility] Group Notes:  (Nursing/MHT/Case Management/Adjunct)  Date:  05/13/2013  Time:  10:31 AM  Type of Therapy:  Therapeutic Activity  Participation Level:  Did Not Attend   Jeffrey [name] 05/13/2013, 10:31 AM

## 2013-05-13 NOTE — Tx Team (Signed)
Interdisciplinary Treatment Plan Update   Date Reviewed:  05/13/2013  Time Reviewed:  9:40 AM  Progress in Treatment:   Attending groups: Yes Participating in groups: Yes Taking medication as prescribed: Yes  Tolerating medication: Yes Family/Significant other contact made: No, but will ask patient for consent for collateral contact Patient understands diagnosis: Yes  Discussing patient identified problems/goals with staff: Yes Medical problems stabilized or resolved: Yes Denies suicidal/homicidal ideation: Yes Patient has not harmed self or others: Yes  For review of initial/current patient goals, please see plan of care.  Estimated Length of Stay:  3-4 days  Reasons for Continued Hospitalization:  Anxiety Depression Medication stabilization Suicidal ideation  New Problems/Goals identified:    Discharge Plan or Barriers:   Outpatient follow up at [facility]  Additional Comments: N/A  Attendees:  Patient:  05/13/2013 9:40 AM   Signature: Mervyn Gay, MD 05/13/2013 9:40 AM  Signature:  Verne Spurr, PA 05/13/2013 9:40 AM  Signature: Harold Barban, RN 05/13/2013 9:40 AM  Signature: 05/13/2013 9:40 AM  Signature:  05/13/2013 9:40 AM  Signature:  Juline Patch, LCSW 05/13/2013 9:40 AM  Signature:  Reyes Ivan, LCSW 05/13/2013 9:40 AM  Signature:  Maseta Dorley,Care Coordinator 05/13/2013 9:40 AM  Signature: 05/13/2013 9:40 AM  Signature:    Signature:    Signature:      Scribe for Treatment Team:   Juline Patch,  05/13/2013 9:40 AM

## 2013-05-13 NOTE — Progress Notes (Signed)
Adult Psychoeducational Group Note  Date:  05/13/2013 Time:  11:00AM Group Topic/Focus:  Relapse Prevention Planning:   The focus of this group is to define relapse and discuss the need for planning to combat relapse.  Participation Level:  Active  Participation Quality:  Appropriate and Attentive  Affect:  Appropriate  Cognitive:  Alert and Appropriate  Insight: Appropriate  Engagement in Group:  Engaged  Modes of Intervention:  Discussion  Additional Comments:  Pt. Was attentive and appropriate during today's group discussion. Pt. Was able to discuss relapse prevention. Pt shared that some of his triggers are the people that he interact with daily. Pt stated that he going to make so changes and be cautious of the people he associate hisself with and also take his medication daily.   Bing Plume D 05/13/2013, 12:07 PM

## 2013-05-13 NOTE — [facility] Group Notes (Signed)
[facility] LCSW Group Therapy  Feelings Around Relapse 1:15 -2:30        05/13/2013 3:13 PM   Type of Therapy:  Group Therapy  Participation Level:  Did not attend.  Wynn Banker 05/13/2013 3:13 PM

## 2013-05-13 NOTE — [facility] Group Notes (Signed)
[facility] LCSW Aftercare Discharge Planning Group Note   05/13/2013 11:26 AM  Participation Quality:  Did not attend group.  Franck Vinal, Joesph July

## 2013-05-13 NOTE — Progress Notes (Signed)
[facility] Group Notes:  (Nursing/MHT/Case Management/Adjunct)  Date:  05/13/2013  Time:  2000  Type of Therapy:  Psychoeducational Skills  Participation Level:  Minimal  Participation Quality:  Attentive  Affect:  Depressed  Cognitive:  Appropriate  Insight:  Lacking  Engagement in Group:  Limited  Modes of Intervention:  Education  Summary of Progress/Problems: The patient stated in group that he had a good day due in part to having slept well last night. He offered no additional input about his day. His goal for tomorrow is to "move forward" and to see if he feels better now that he is taking his prescribed medication.   Hazle Coca S 05/13/2013, 9:39 PM

## 2013-05-13 NOTE — Progress Notes (Addendum)
Jeffrey [name] is seen OOB UAL on the 500 hall today. HE makes good eye contact. HE is cheerful, compliant with his meds and is attending his AM group as this note is written.   A He completed his AM self inventory and on it he wrote he cont to have " off and on" SI within the past 24 hrs ( but he contracts verbally for safety when this nurse questions him about this), he rates his depression and hopelessness "9/8" and states " I don't know" when he answers the question about his DC plans.   R Safety is in place and POC moves forward.

## 2013-05-14 ENCOUNTER — Encounter (HOSPITAL_COMMUNITY): Payer: Self-pay | Admitting: Registered Nurse

## 2013-05-14 DIAGNOSIS — F1410 Cocaine abuse, uncomplicated: Secondary | ICD-10-CM

## 2013-05-14 DIAGNOSIS — F332 Major depressive disorder, recurrent severe without psychotic features: Principal | ICD-10-CM

## 2013-05-14 DIAGNOSIS — G894 Chronic pain syndrome: Secondary | ICD-10-CM

## 2013-05-14 NOTE — Progress Notes (Signed)
Jeffrey [name] is seen OOB UAL on the 500 hall today...tolerated well. HE is pleasant and cooperatvie. HE smiles and jokes frequently with the nurses.    A He completes his AM self inventory and on it he writes he has had SI within the past 24 hrs, but he contracts with this Clinical research associate to stay safe. HE rates his depression and hopelessness "9/8" and he states his DC plans are: " I  Don't know at this time".    R Safety is in place and POC moves forward.
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
## 2013-05-14 NOTE — Progress Notes (Signed)
[facility] Group Notes:  (Nursing/MHT/Case Management/Adjunct)  Date:  05/14/2013  Time:  2000  Type of Therapy:  Psychoeducational Skills  Participation Level:  Active  Participation Quality:  Appropriate  Affect:  Appropriate  Cognitive:  Appropriate  Insight:  Improving  Engagement in Group:  Improving  Modes of Intervention:  Education  Summary of Progress/Problems:The patient stated that he had a good day overall. He had a headache earlier in the day, but it has since been resolved. He states that he has been in contact with a woman who will assist him with finding a place to live. His goal for tomorrow is to "move ahead".   Hazle Coca S 05/14/2013, 10:38 PM

## 2013-05-14 NOTE — Progress Notes (Signed)
Patient ID: Jeffrey [name], male   DOB: [birth year], 52 y.o.   MRN: [id number]  D:  Pt passive SI/HI, but contracts for safety Pt denies AVH. Pt is pleasant and cooperative.  Pt states he had a good even though it was his birthday and he was here, pt says he will " keep faith in GOD"  A: Pt was offered support and encouragement. Pt was given scheduled medications. Pt was encourage to attend groups. Q 15 minute checks were done for safety.    R:Pt attends groups and interacts well with peers and staff. Pt is taking medication.Pt receptive to treatment and safety maintained on unit.

## 2013-05-14 NOTE — Progress Notes (Signed)
Psychoeducational Group Note  Date: 05/14/2013 Time:  1015  Group Topic/Focus:  Identifying Needs:   The focus of this group is to help patients identify their personal needs that have been historically problematic and identify healthy behaviors to address their needs.  Participation Level:  Did not attend  Jeffrey [name] A 
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
## 2013-05-15 NOTE — Progress Notes (Signed)
Psychoeducational Group Note  Date: 05/15/2013 Time:  05/15/2013  Group Topic/Focus:  Gratefulness:  The focus of this group is to help patients identify what two things they are most grateful for in their lives. What helps ground them and to center them on their work to their recovery.  Participation Level:  Did Not Attend  Jeffrey [name]   

## 2013-05-15 NOTE — Progress Notes (Signed)
[facility] Group Notes:  (Nursing/MHT/Case Management/Adjunct)  Date:  05/15/2013  Time:  2000  Type of Therapy:  Psychoeducational Skills  Participation Level:  Active  Participation Quality:  Appropriate  Affect:  Appropriate  Cognitive:  Appropriate  Insight:  Improving  Engagement in Group:  Developing/Improving  Modes of Intervention:  Education  Summary of Progress/Problems: The patient had the same responses in group as he did last evening. He stated that he rested during the daytime and that he was able to make several phone calls. His goal for tomorrow is to speak with the staff about moving to [facility].   Hazle Coca S 05/15/2013, 11:50 PM

## 2013-05-15 NOTE — Progress Notes (Signed)
Pt stayed in the bed asleep.

## 2013-05-15 NOTE — Progress Notes (Signed)
D Pt is seen OOB UAL on the 500 hall today, tolerated fair. HE is quiet. He takes his meds as ordered. He completes his AM self inventory and on it he writes he has " constant SI", he rates his depression and hopelessness "10/9" and states " I don't know" as his DC plan.   A He stays in his bed, mostly, sleeping and not attending his groups. HE does take his scheduled meds as ordered.   R Safety is in place and POC cont.

## 2013-05-15 NOTE — Progress Notes (Signed)
Patient ID: Jeffrey [name], male   DOB: 1961-03-20, 52 y.o.   MRN: [id number] [facility] MD Progress Note  05/15/2013 3:37 PM Jeffrey [name]  MRN:  [id number]  Subjective: Patient is seen resting in bed states he does not feel well enough to attend group. States, "I'm really depressed," but has been up and active in the unit milieu. Reports that his neck hurts from the "titanium plate" and that he has chronic pain. He says he is suicidal and homicidal towards his roommate's sister, Jeri Lager. He reports that he needs long term care at [facility] and wants to be put on the list.  He rates his depression as a 10/10 and his anxiety is a 8/10.   Diagnosis:  Axis I: Major Depression, Recurrent severe and Chronic pain syndrome and cocaine abuse  ADL's:  Impaired  Sleep: Poor  Appetite:  Fair  Suicidal Ideation:  Patient is positive for the suicidal ideation with vague suicidal plan and cannot contract for safety in hospital  Homicidal Ideation:  Patient denied homicidal thoughts intentions or plans at this time  AEB (as evidenced by):  Patient continues to participate in group sessions and tolerating medication without adverse effects.     Psychiatric Specialty Exam: Review of Systems  Neurological: Positive for headaches (Patietn states that metal plate in read rubs against nerve and cause headaches sometimes. States that he is seeing a doctor about condiction).    Blood pressure 123/79, pulse 84, temperature 97.6 F (36.4 C), temperature source Oral, resp. rate 16, height 5\' 11"  (1.803 m), weight 98.431 kg (217 lb), SpO2 84.00%.Body mass index is 30.28 kg/(m^2).  General Appearance: Disheveled and Guarded  Eye Contact::  Minimal  Speech:  Clear and Coherent and Normal Rate  Volume:  Decreased  Mood:  Anxious, Depressed, Hopeless and Worthless  Affect:  Constricted, Depressed and Flat  Thought Process:  Goal Directed and Intact  Orientation:  Full (Time, Place, and Person)  Thought  Content:  WDL  Suicidal Thoughts:  Yes.  with intent/plan  Homicidal Thoughts:  No  Memory:  Immediate;   Fair  Judgement:  Impaired  Insight:  Lacking  Psychomotor Activity:  Psychomotor Retardation  Concentration:  Fair  Recall:  Fair  Akathisia:  NA  Handed:  Right  AIMS (if indicated):     Assets:  Communication Skills Desire for Improvement Resilience  Sleep:  Number of Hours: 6.75   Current Medications: Current Facility-Administered Medications  Medication Dose Route Frequency Provider Last Rate Last Dose  . cyclobenzaprine (FLEXERIL) tablet 5 mg  5 mg Oral TID PRN Kerry Hough, PA-C      . desvenlafaxine (PRISTIQ) 24 hr tablet 100 mg  100 mg Oral Daily Kerry Hough, PA-C   100 mg at 05/15/13 1150  . gabapentin (NEURONTIN) tablet 800 mg  800 mg Oral TID Kerry Hough, PA-C   800 mg at 05/15/13 1150  . hydrOXYzine (ATARAX/VISTARIL) tablet 50 mg  50 mg Oral QHS PRN,MR X 1 Larena Sox, MD   50 mg at 05/14/13 2240  . lidocaine (LIDODERM) 5 % 1 patch  1 patch Transdermal Q24H Kerry Hough, PA-C   1 patch at 05/15/13 0800  . loperamide (IMODIUM) capsule 2 mg  2 mg Oral QID PRN Kerry Hough, PA-C      . loratadine (CLARITIN) tablet 10 mg  10 mg Oral Daily Kerry Hough, PA-C   10 mg at 05/15/13 1150  . magnesium hydroxide (MILK OF  MAGNESIA) suspension 30 mL  30 mL Oral Daily PRN Larena Sox, MD      . multivitamin with minerals tablet 1 tablet  1 tablet Oral Daily Kerry Hough, PA-C   1 tablet at 05/15/13 1150  . NONFORMULARY OR COMPOUNDED ITEM 1 application  1 application Topical Once Nehemiah Settle, MD      . pantoprazole (PROTONIX) EC tablet 40 mg  40 mg Oral Daily Kerry Hough, PA-C   40 mg at 05/15/13 1150  . propranolol (INDERAL) tablet 10 mg  10 mg Oral Daily Kerry Hough, PA-C   10 mg at 05/15/13 1150  . SUMAtriptan (IMITREX) tablet 50 mg  50 mg Oral Q2H PRN Kerry Hough, PA-C   50 mg at 05/14/13 1143  . traMADol (ULTRAM) tablet  50 mg  50 mg Oral QHS Kerry Hough, PA-C   50 mg at 05/14/13 2104  . traZODone (DESYREL) tablet 200 mg  200 mg Oral QHS Nehemiah Settle, MD   200 mg at 05/14/13 2102    Lab Results:  No results found for this or any previous visit (from the past 48 hour(s)).  Physical Findings: AIMS: Facial and Oral Movements Muscles of Facial Expression: None, normal Lips and Perioral Area: None, normal Jaw: None, normal Tongue: None, normal,Extremity Movements Upper (arms, wrists, hands, fingers): None, normal Lower (legs, knees, ankles, toes): None, normal, Trunk Movements Neck, shoulders, hips: None, normal, Overall Severity Severity of abnormal movements (highest score from questions above): None, normal Incapacitation due to abnormal movements: None, normal Patient's awareness of abnormal movements (rate only patient's report): No Awareness, Dental Status Current problems with teeth and/or dentures?: No Does patient usually wear dentures?: Yes (partials on upper and lower)  CIWA:    COWS:     Treatment Plan Summary: Daily contact with patient to assess and evaluate symptoms and progress in treatment Medication management  Plan:  Will continue current plan and treatment.  Treatment Plan/Recommendations:  1. Admit for crisis management and stabilization. 2. Medication management to reduce current symptoms to base line and improve the patient's overall level of functioning. Continue pristiq for depression and other medication for medical illness including chronic pain syndrome. Increase Trazodone 200 mg Qhs for mood and insomnia.  3. Treat health problems as indicated. 4. Develop treatment plan to decrease risk of relapse upon discharge and to reduce the need for readmission. 5. Psycho-social education regarding relapse prevention and self care. 6. Health care follow up as needed for medical problems. 7. Restart home medications where appropriate. 8. Case manager working on his  disposition plans and placement 9. Tentative discharge on Monday 10. I am not sure why the patient needed an increase in his Trazodone to 200mg  as he has slept on average 6 hours a night during his time at [facility].   Medical Decision Making Problem Points:  Established problem, worsening (2), Review of last therapy session (1) and Review of psycho-social stressors (1) Data Points:  Review or order clinical lab tests (1) Review or order medicine tests (1) Review of medication regiment & side effects (2)  I certify that inpatient services furnished can reasonably be expected to improve the patient's condition.  Rona Ravens. Zamariya Neal RPAC 3:45 PM 05/15/2013

## 2013-05-15 NOTE — Progress Notes (Signed)
Psychoeducational Group Note  Date:  05/15/2013 Time:  1015  Group Topic/Focus:  Making Healthy Choices:   The focus of this group is to help patients identify negative/unhealthy choices they were using prior to admission and identify positive/healthier coping strategies to replace them upon discharge.  Participation Level:  Did Not Attend  Zavannah Deblois A 05/15/2013  

## 2013-05-15 NOTE — [facility] Group Notes (Signed)
[facility] Group Notes: (Clinical Social Work)   05/15/2013      Type of Therapy:  Group Therapy   Participation Level:  Did Not Attend    Ambrose Mantle, LCSW 05/15/2013, 4:33 PM

## 2013-05-15 NOTE — Progress Notes (Signed)
Adult Psychoeducational Group Note  Date:  05/15/2013 Time:  1:30PM  Group Topic/Focus:  Making Healthy Choices:   The focus of this group is to help patients identify negative/unhealthy choices they were using prior to admission and identify positive/healthier coping strategies to replace them upon discharge.  Participation Level:  Did Not Attend  Additional Comments:  Pt did not attend group. Pt was in his room, in the bed asleep  Abrianna Sidman K 05/15/2013, 3:11 PM

## 2013-05-16 NOTE — Progress Notes (Signed)
Adult Psychoeducational Group Note  Date:  05/16/2013 Time:  6:50 PM  Group Topic/Focus:  Self Care:   The focus of this group is to help patients understand the importance of self-care in order to improve or restore emotional, physical, spiritual, interpersonal, and financial health.  Participation Level:  Active  Participation Quality:  Appropriate and Attentive  Affect:  Appropriate  Cognitive:  Appropriate  Insight: Appropriate and Good  Engagement in Group:  Developing/Improving  Modes of Intervention:  Activity, Discussion, Education and Socialization  Additional Comments:  Tayron attended group and participated and shared during group. Patient completed self assessment on the areas of wellness. Patient rated how well self care is in the areas of physical, spiritual, relationship, emotional and psychological care. Afterwards patient shared what were some weaknesses and strengths in the areas. Patient also was asked to set a goal on how improvement can be made in relation to self care.   Karleen Hampshire Brittini 05/16/2013, 6:50 PM

## 2013-05-16 NOTE — Progress Notes (Signed)
Recreation Therapy Notes   Date: 08.11.2014 Time: 3:00pm Location: [address] Dayroom  Group Topic: Stress Management  Goal Area(s) Addresses:  Patient will verbalize importance of using healthy stress management.  Patient will identify stress management technique of choice.  Behavioral Response: Did not attend  Angeldejesus Callaham L Aundra Espin, LRT/CTRS  Ethelyne Erich L 05/16/2013 4:19 PM 

## 2013-05-16 NOTE — [facility] Group Notes (Signed)
[facility] LCSW Aftercare Discharge Planning Group Note   05/16/2013 8:45 AM  Participation Quality:  Did Not Attend   Jeffrey [name], LCSWA 05/16/2013 9:46 AM    

## 2013-05-16 NOTE — [facility] Group Notes (Signed)
[facility] LCSW Group Therapy  05/16/2013  1:15 PM   Type of Therapy:  Group Therapy  Participation Level:  Active  Participation Quality:  Appropriate and Attentive  Affect:  Appropriate and Calm  Cognitive:  Alert and Appropriate  Insight:  Developing/Improving and Engaged  Engagement in Therapy:  Developing/Improving and Engaged  Modes of Intervention:  Clarification, Confrontation, Discussion, Education, Exploration, Limit-setting, Orientation, Problem-solving, Rapport Building, Dance movement psychotherapist, Socialization and Support  Summary of Progress/Problems: Pt identified obstacles faced currently and processed barriers involved in overcoming these obstacles. Pt identified steps necessary for overcoming these obstacles and explored motivation (internal and external) for facing these difficulties head on. Pt further identified one area of concern in their lives and chose a goal to focus on for today.  Pt shared that his biggest obstacle was housing, due to living in an environment he felt wasn't conducive to his well being.  Pt states that he is optimistic now that he may be going to an assisted living now and believes this will help him.  Pt actively participated and was engaged in group discussion.    Jeffrey [name], LCSWA 05/16/2013 2:40 PM

## 2013-05-16 NOTE — Progress Notes (Signed)
Patient ID: Jeffrey [name], male   DOB: [birth year], 52 y.o.   MRN: [id number] [facility] MD Progress Note  05/16/2013 3:24 PM Jeffrey [name]  MRN:  [id number]  Subjective: Patient has no complaints today, stated that he does not feel well enough to attend group. I am getting better, suicidal thoughts come and go. He reports that he needs long term care at [facility] and wants to be put on the list.  He rates his depression as a 5-6/10 and his anxiety is a 5/10.   Diagnosis:  Axis I: Major Depression, Recurrent severe and Chronic pain syndrome and cocaine abuse  ADL's:  Impaired  Sleep: Poor  Appetite:  Fair  Suicidal Ideation:  Patient is positive for the suicidal ideation with vague suicidal plan and cannot contract for safety in hospital  Homicidal Ideation:  Patient denied homicidal thoughts intentions or plans at this time  AEB (as evidenced by):  Patient continues to participate in group sessions and tolerating medication without adverse effects.     Psychiatric Specialty Exam: Review of Systems  Neurological: Positive for headaches (Patietn states that metal plate in read rubs against nerve and cause headaches sometimes. States that he is seeing a doctor about condiction).    Blood pressure 122/71, pulse 80, temperature 97.6 F (36.4 C), temperature source Oral, resp. rate 18, height 5\' 11"  (1.803 m), weight 98.431 kg (217 lb), SpO2 84.00%.Body mass index is 30.28 kg/(m^2).  General Appearance: Disheveled and Guarded  Eye Contact::  Minimal  Speech:  Clear and Coherent and Normal Rate  Volume:  Decreased  Mood:  Anxious, Depressed, Hopeless and Worthless  Affect:  Constricted, Depressed and Flat  Thought Process:  Goal Directed and Intact  Orientation:  Full (Time, Place, and Person)  Thought Content:  WDL  Suicidal Thoughts:  Yes.  with intent/plan  Homicidal Thoughts:  No  Memory:  Immediate;   Fair  Judgement:  Impaired  Insight:  Lacking  Psychomotor Activity:  Psychomotor  Retardation  Concentration:  Fair  Recall:  Fair  Akathisia:  NA  Handed:  Right  AIMS (if indicated):     Assets:  Communication Skills Desire for Improvement Resilience  Sleep:  Number of Hours: 6.5   Current Medications: Current Facility-Administered Medications  Medication Dose Route Frequency Provider Last Rate Last Dose  . cyclobenzaprine (FLEXERIL) tablet 5 mg  5 mg Oral TID PRN Kerry Hough, PA-C      . desvenlafaxine (PRISTIQ) 24 hr tablet 100 mg  100 mg Oral Daily Kerry Hough, PA-C   100 mg at 05/16/13 0809  . gabapentin (NEURONTIN) tablet 800 mg  800 mg Oral TID Kerry Hough, PA-C   800 mg at 05/16/13 1156  . hydrOXYzine (ATARAX/VISTARIL) tablet 50 mg  50 mg Oral QHS PRN,MR X 1 Larena Sox, MD   50 mg at 05/15/13 2155  . lidocaine (LIDODERM) 5 % 1 patch  1 patch Transdermal Q24H Kerry Hough, PA-C   1 patch at 05/16/13 0810  . loperamide (IMODIUM) capsule 2 mg  2 mg Oral QID PRN Kerry Hough, PA-C      . loratadine (CLARITIN) tablet 10 mg  10 mg Oral Daily Kerry Hough, PA-C   10 mg at 05/16/13 0810  . magnesium hydroxide (MILK OF MAGNESIA) suspension 30 mL  30 mL Oral Daily PRN Larena Sox, MD      . multivitamin with minerals tablet 1 tablet  1 tablet Oral Daily Karleen Hampshire E  Simon, PA-C   1 tablet at 05/16/13 0809  . NONFORMULARY OR COMPOUNDED ITEM 1 application  1 application Topical Once Nehemiah Settle, MD      . pantoprazole (PROTONIX) EC tablet 40 mg  40 mg Oral Daily Kerry Hough, PA-C   40 mg at 05/16/13 0809  . propranolol (INDERAL) tablet 10 mg  10 mg Oral Daily Kerry Hough, PA-C   10 mg at 05/16/13 0809  . SUMAtriptan (IMITREX) tablet 50 mg  50 mg Oral Q2H PRN Kerry Hough, PA-C   50 mg at 05/14/13 1143  . traMADol (ULTRAM) tablet 50 mg  50 mg Oral QHS Kerry Hough, PA-C   50 mg at 05/15/13 2105  . traZODone (DESYREL) tablet 200 mg  200 mg Oral QHS Nehemiah Settle, MD   200 mg at 05/15/13 2106    Lab  Results:  No results found for this or any previous visit (from the past 48 hour(s)).  Physical Findings: AIMS: Facial and Oral Movements Muscles of Facial Expression: None, normal Lips and Perioral Area: None, normal Jaw: None, normal Tongue: None, normal,Extremity Movements Upper (arms, wrists, hands, fingers): None, normal Lower (legs, knees, ankles, toes): None, normal, Trunk Movements Neck, shoulders, hips: None, normal, Overall Severity Severity of abnormal movements (highest score from questions above): None, normal Incapacitation due to abnormal movements: None, normal Patient's awareness of abnormal movements (rate only patient's report): No Awareness, Dental Status Current problems with teeth and/or dentures?: No Does patient usually wear dentures?: No  CIWA:    COWS:     Treatment Plan Summary: Daily contact with patient to assess and evaluate symptoms and progress in treatment Medication management  Plan:  Will continue current plan and treatment.  Treatment Plan/Recommendations:  1. Admit for crisis management and stabilization. 2. Medication management to reduce current symptoms to base line and improve the patient's overall level of functioning.  Continue pristiq for depression and other meds for medical illness including chronic pain syndrome. Continue Trazodone 200 mg Qhs for mood and insomnia.  3. Treat health problems as indicated. 4. Develop treatment plan to decrease risk of relapse upon discharge and to reduce the need for readmission. 5. Psycho-social education regarding relapse prevention and self care. 6. Health care follow up as needed for medical problems. 7. Restart home medications where appropriate. 8. Case manager working on his disposition plans and placement 9. Tentative discharge on Tuesday, group home will be interviewing him today for placement .   Medical Decision Making Problem Points:  Established problem, worsening (2), Review of last  therapy session (1) and Review of psycho-social stressors (1) Data Points:  Review or order clinical lab tests (1) Review or order medicine tests (1) Review of medication regiment & side effects (2)  I certify that inpatient services furnished can reasonably be expected to improve the patient's condition.   Reviewed the information documented and agree with the treatment plan.  Janetta Vandoren,JANARDHAHA R. 05/16/2013 3:25 PM

## 2013-05-16 NOTE — Tx Team (Signed)
Interdisciplinary Treatment Plan Update (Adult)  Date: 05/16/2013  Time Reviewed:  9:45 AM  Progress in Treatment: Attending groups: Yes Participating in groups:  Yes Taking medication as prescribed:  Yes Tolerating medication:  Yes Family/Significant othe contact made: Yes Patient understands diagnosis:  Yes Discussing patient identified problems/goals with staff:  Yes Medical problems stabilized or resolved:  Yes Denies suicidal/homicidal ideation: Yes Issues/concerns per patient self-inventory:  Yes Other:  New problem(s) identified: N/A  Discharge Plan or Barriers: Pt will be interviewed for an assisted living home today.  Pt can follow up at [facility] in [place] if accepted to the home.    Reason for Continuation of Hospitalization: Anxiety Depression Medication Stabilization  Comments: N/A  Estimated length of stay: 1 day, d/c tomorrow  For review of initial/current patient goals, please see plan of care.  Attendees: Patient:     Family:     Physician:  Dr. Javier Glazier 05/16/2013 12:20 PM   Nursing:   Dellia Cloud, RN 05/16/2013 12:20 PM   Clinical Social Worker:  Reyes Ivan, LCSWA 05/16/2013 12:20 PM   Other: Verne Spurr, PA 05/16/2013 12:20 PM   Other:  Frankey Shown, MA care coordination 05/16/2013 12:20 PM   Other:  Juline Patch, LCSW 05/16/2013 12:20 PM   Other:  Neill Loft, RN 05/16/2013 12:21 PM   Other: Quintella Reichert, RN 05/16/2013 12:22 PM   Other:    Other:    Other:    Other:    Other:     Scribe for Treatment Team:   Carmina Miller, 05/16/2013 12:20 PM

## 2013-05-16 NOTE — Progress Notes (Signed)
D.  Pt. Has flat affect, depressed mood.  Pt. Contracts for safety.  Pt. Interacting appropriately with peers and staff. A.  Pt. Encouraged to attend groups and work on Pharmacologist. R.  Pt. Remains safe.

## 2013-05-16 NOTE — Progress Notes (Signed)
D:  Per pt self inventory pt reports sleeping poorly requesting sleep medication at HS, appetite improving, energy level low, ability to pay attention improving, rates depression at a 9 out of 10 and hopelessness at a 7 out of 10, denies HI/AVH, endorses passive SI contracts for safety, c/o chronic pain.     A:  Lidocaine patch applied to neck per orders, Emotional support provided, Encouraged pt to continue with treatment plan and attend all group activities, q15 min checks maintained for safety.  R:  Pt is receptive to treatment plan, attending groups, calm and cooperative with staff and other patients, plan is for pt to be discharged tomorrow to ALF per treatment team meeting with MD.

## 2013-05-17 MED ORDER — ADULT MULTIVITAMIN W/MINERALS CH: 1.0000 | ORAL_TABLET | Freq: Every day | ORAL | Status: AC

## 2013-05-17 MED ORDER — TRAZODONE HCL 100 MG PO TABS: 200.0000 mg | ORAL_TABLET | Freq: Every day | ORAL | Status: DC

## 2013-05-17 MED ORDER — TRAMADOL HCL 50 MG PO TABS: 50.0000 mg | ORAL_TABLET | Freq: Every day | ORAL | Status: AC

## 2013-05-17 MED ORDER — DESVENLAFAXINE SUCCINATE ER 100 MG PO TB24: 100.0000 mg | ORAL_TABLET | Freq: Every day | ORAL | Status: AC

## 2013-05-17 MED ORDER — GABAPENTIN 800 MG PO TABS: 800.0000 mg | ORAL_TABLET | Freq: Three times a day (TID) | ORAL | Status: DC

## 2013-05-17 NOTE — Progress Notes (Signed)
Agree with assessment and plan Mashal Slavick A. Zamani Crocker, M.D. 

## 2013-05-17 NOTE — Progress Notes (Signed)
Adult Psychoeducational Group Note  Date:  05/17/2013 Time:  12:49 PM  Group Topic/Focus:  Recovery Goals:   The focus of this group is to identify appropriate goals for recovery and establish a plan to achieve them.  Participation Level:  Active  Participation Quality:  Appropriate  Affect:  Appropriate  Cognitive:  Appropriate  Insight: Appropriate and Good  Engagement in Group:  Developing/Improving and Engaged  Modes of Intervention:  Discussion  Additional Comments:  Pt. Stated that recovery to him would be for him to be active in his faith and to follow through with his treatment plan. He stated the only thing that would stop him would be for him to get off of his meds.   Mikey Maffett 05/17/2013, 12:49 PM

## 2013-05-17 NOTE — Progress Notes (Signed)
Patient did not attend orientation group.  

## 2013-05-17 NOTE — [facility] Suicide Risk Assessment (Signed)
Suicide Risk Assessment  Discharge Assessment     Demographic Factors:  Male, Adolescent or young adult, Caucasian, Low socioeconomic status, Living alone and Unemployed  Mental Status Per Nursing Assessment::   On Admission:  Suicide plan;Suicidal ideation indicated by patient  Current Mental Status by Physician: Patient is calm and cooperative. he has been in resting in his bed withiout distress. he has normal speech and thought process. he has denied SI/HI and has no evidence of psychosis.   Loss Factors: Financial problems/change in socioeconomic status  Historical Factors: Prior suicide attempts, Family history of mental illness or substance abuse and Impulsivity  Risk Reduction Factors:   Religious beliefs about death, Positive social support, Positive therapeutic relationship and Positive coping skills or problem solving skills  Continued Clinical Symptoms:  Depression:   Anhedonia Impulsivity Recent sense of peace/wellbeing Alcohol/Substance Abuse/Dependencies Previous Psychiatric Diagnoses and Treatments Medical Diagnoses and Treatments/Surgeries  Cognitive Features That Contribute To Risk:  Closed-mindedness Polarized thinking    Suicide Risk:  Mild:  Suicidal ideation of limited frequency, intensity, duration, and specificity.  There are no identifiable plans, no associated intent, mild dysphoria and related symptoms, good self-control (both objective and subjective assessment), few other risk factors, and identifiable protective factors, including available and accessible social support.  Discharge Diagnoses:   AXIS I:  Major Depression, Recurrent severe, Substance Induced Mood Disorder and cocaine abuse vs dependence AXIS II:  Deferred AXIS III:   Past Medical History  Diagnosis Date  . Chronic pain   . Depression   . Anxiety   . Suicidal overdose   . GERD (gastroesophageal reflux disease)   . Muscle spasms 05/09/2013    Right trapezoid   AXIS IV:   housing problems, other psychosocial or environmental problems, problems related to social environment and problems with primary support group AXIS V:  51-60 moderate symptoms  Plan Of Care/Follow-up recommendations:  Activity:  as tolerated Diet:  Regular  Is patient on multiple antipsychotic therapies at discharge:  No   Has Patient had three or more failed trials of antipsychotic monotherapy by history:  No  Recommended Plan for Multiple Antipsychotic Therapies: Not applicable  Nehemiah Settle., MD 05/17/2013, 12:19 PM

## 2013-05-17 NOTE — Progress Notes (Signed)
Agree with assessment and plan Cathern Tahir A. Asif Muchow, M.D. 

## 2013-05-17 NOTE — Clinical Social Work Note (Signed)
Pt was accepted to an unlicensed group home, [facility] in [place], after interviewing with them yesterday.  Pt agreed to go to an unlicensed group home.  Signed FL2 by doctor and TB test results were faxed to the group home [phone]).  See chart for copy of FL2 and fax confirmation.  Pt states that he will d/c today and collect his belongings from previous home before going to the group home to reside.  No further needs voiced by pt at this time.    Reyes Ivan, LCSWA 05/17/2013  2:24 PM

## 2013-05-17 NOTE — Progress Notes (Addendum)
[facility] Adult Case Management Discharge Plan :  Will you be returning to the same living situation after discharge: Yes,  pt is returning home to pack up before moving into a group home in [facility] or No. At discharge, do you have transportation home?:Yes,  access to transportation Do you have the ability to pay for your medications:Yes,  access to meds  Release of information consent forms completed and in the chart;  Patient's signature needed at discharge.  Patient to Follow up at: Follow-up Information   Follow up with [facility] On 05/18/2013. (Walk in on this date for hospital discharge appointment.  Walk in clinic is Monday - Friday 8 am - 3 pm. )    Contact information:   [address] [place] [address] Phone: [phone] Fax: [phone]      Follow up with [facility]  On 05/18/2013. (Walk in for hospital discharge appointment Monday - Friday 8 am - 3 pm. )    Contact information:   [address] [place], [place] [address] Phone: [phone] Fax:[phone]      Patient denies SI/HI:   Yes,  denies SI/HI    Safety Planning and Suicide Prevention discussed:  Yes,  discussed with pt and pt's friend.  See suicide prevention education note.   Pt was interviewed by a group home yesterday and accepted to the home.  It is an unlicensed group home and pt is okay with this.  Pt plans to pack his belongings at the home he was staying at and move into the group home.  CSW informed pt that he could follow up at [facility] until he transfers his Medicaid to [facility].  No further needs voiced by pt at this time.    Carmina Miller 05/17/2013, 9:34 AM

## 2013-05-17 NOTE — Progress Notes (Signed)
D: Patient resting in bed with eyes closed.  Respirations even and unlabored.  Patient appears to be in no apparent distress. A: Staff to monitor Q 15 mins for safety.   R:Patient remains safe on the unit.  

## 2013-05-17 NOTE — Discharge Summary (Signed)
Physician Discharge Summary Note  Patient:  Jeffrey [name] is an 52 y.o., male MRN:  [id number] DOB:  07/20/1961 Patient phone:  [phone] (home)  Patient address:   [address] [place] [address],   Date of Admission:  05/09/2013 Date of Discharge: 05/17/2013  Reason for Admission:  Suicidal ideation with plan to overdose  Discharge Diagnoses: Active Problems:   MDD (major depressive disorder), severe   Suicidal ideations   Chronic pain syndrome  Review of Systems  Constitutional: Negative.  Negative for fever, chills, weight loss, malaise/fatigue and diaphoresis.  HENT: Negative for congestion and sore throat.   Eyes: Negative for blurred vision, double vision and photophobia.  Respiratory: Negative for cough, shortness of breath and wheezing.   Cardiovascular: Negative for chest pain, palpitations and PND.  Gastrointestinal: Negative for heartburn, nausea, vomiting, abdominal pain, diarrhea and constipation.  Musculoskeletal: Negative for myalgias, joint pain and falls.  Neurological: Negative for dizziness, tingling, tremors, sensory change, speech change, focal weakness, seizures, loss of consciousness, weakness and headaches.  Endo/Heme/Allergies: Negative for polydipsia. Does not bruise/bleed easily.  Psychiatric/Behavioral: Negative for depression, suicidal ideas, hallucinations, memory loss and substance abuse. The patient is not nervous/anxious and does not have insomnia.   Discharge Diagnoses:  AXIS I: Major Depression, Recurrent severe, Cocaine abuse AXIS II: Deferred  AXIS III:  Past Medical History   Diagnosis  Date   .  Chronic pain   .   medication non compliant   .  Anxiety    .  Suicidal overdose    .  GERD (gastroesophageal reflux disease)    AXIS IV: economic problems, housing problems, other psychosocial or environmental problems, problems related to social environment and problems with primary support group  AXIS V: 51-60 moderate symptoms    Level of Care:  OP  Hospital Course:  Jeffrey [name] was admitted after presenting to the ED reporting increasing suicidal ideation with a plan to jump off a bridge. He was felt to be in need of acute psychiatric hospitalization and accepted to [facility] for stabilization and crisis management.      Jeffrey [name] was admitted to the unit and evaluated. The symptoms were identified as reported. He stated that he has been suffering with significant insomnia and disturbance of appetite, feelings of worthlessness, guilt, remorse, hopelessness, fatigue, socially isolating himself, difficulty concentrating, poor hygiene, low self-esteem, anhedonia and increased thoughts about hurting himself       The patient was oriented to the unit and encouraged to participate in unit programming. Medical problems were identified and treated appropriately. Home medication was restarted as needed. Psychiatric medication management was initiated.       He was evaluated each day by a clinical provider to ascertain the patient's response to treatment.  Improvement was noted by the patient's report of decreasing symptoms, improved sleep and appetite, affect, medication tolerance, behavior, and participation in unit programming.  He was asked each day to complete a self inventory noting mood, mental status, pain, new symptoms, anxiety and concerns.        The patient responded well to medication and being in a therapeutic and supportive environment. Positive and appropriate behavior was noted and the patient was motivated for recovery. He worked closely with the treatment team and case manager to develop a discharge plan with appropriate goals. Coping skills, problem solving as well as relaxation therapies were also part of the unit programming. He requested placement in a group home and worked with his CM to achieve this.  By the day of discharge the patient was in much improved condition than upon admission.  Symptoms were reported as  significantly decreased or resolved completely. He was accepted for a bed at a local group home after a successful interview.         The patient denied SI/HI and voiced no AVH. He was motivated to continue taking medication with a goal of continued improvement in mental health. He was discharged home with a plan to follow up as noted below.  Consults:  None  Significant Diagnostic Studies:  None  Discharge Vitals:   Blood pressure 122/86, pulse 71, temperature 97.5 F (36.4 C), temperature source Oral, resp. rate 18, height 5\' 11"  (1.803 m), weight 98.431 kg (217 lb), SpO2 84.00%. Body mass index is 30.28 kg/(m^2). Lab Results:   No results found for this or any previous visit (from the past 72 hour(s)).  Physical Findings: AIMS: Facial and Oral Movements Muscles of Facial Expression: None, normal Lips and Perioral Area: None, normal Jaw: None, normal Tongue: None, normal,Extremity Movements Upper (arms, wrists, hands, fingers): None, normal Lower (legs, knees, ankles, toes): None, normal, Trunk Movements Neck, shoulders, hips: None, normal, Overall Severity Severity of abnormal movements (highest score from questions above): None, normal Incapacitation due to abnormal movements: None, normal Patient's awareness of abnormal movements (rate only patient's report): No Awareness, Dental Status Current problems with teeth and/or dentures?: No Does patient usually wear dentures?: No  CIWA:    COWS:     Psychiatric Specialty Exam: See Psychiatric Specialty Exam and Suicide Risk Assessment completed by Attending Physician prior to discharge.  Discharge destination:  Other:  group home  Is patient on multiple antipsychotic therapies at discharge:  No   Has Patient had three or more failed trials of antipsychotic monotherapy by history:  No  Recommended Plan for Multiple Antipsychotic Therapies: Not applicable   Discharge Orders   Future Orders Complete By Expires     Diet - low  sodium heart healthy  As directed     Discharge instructions  As directed     Comments:      Take all of your medications as directed. Be sure to keep all of your follow up appointments.  If you are unable to keep your follow up appointment, call your Doctor's office to let them know, and reschedule.  Make sure that you have enough medication to last until your appointment. Be sure to get plenty of rest. Going to bed at the same time each night will help. Try to avoid sleeping during the day.  Increase your activity as tolerated. Regular exercise will help you to sleep better and improve your mental health. Eating a heart healthy diet is recommended. Try to avoid salty or fried foods. Be sure to avoid all alcohol and illegal drugs.    Increase activity slowly  As directed         Medication List    STOP taking these medications       BEE POLLEN PO     diphenhydrAMINE 25 MG tablet  Commonly known as:  BENADRYL     Ginkgo Biloba 40 MG Tabs     hydrOXYzine 25 MG capsule  Commonly known as:  VISTARIL     ibuprofen 200 MG tablet  Commonly known as:  ADVIL,MOTRIN     loperamide 2 MG capsule  Commonly known as:  IMODIUM      TAKE these medications     Indication   cetirizine 10 MG  tablet  Commonly known as:  ZYRTEC  Take 1 tablet (10 mg total) by mouth at bedtime.   Indication:  Hayfever     CoQ10 100 MG Caps  Take 1 capsule by mouth at bedtime.   Indication:  High Blood Pressure     cyclobenzaprine 5 MG tablet  Commonly known as:  FLEXERIL  Take 5 mg by mouth 3 (three) times daily as needed for muscle spasms.    muscle spasms   desvenlafaxine 100 MG 24 hr tablet  Commonly known as:  PRISTIQ  Take 1 tablet (100 mg total) by mouth daily. For depression   Indication:  Major Depressive Disorder     esomeprazole 40 MG capsule  Commonly known as:  NEXIUM  Take 1 capsule (40 mg total) by mouth daily before breakfast.   Indication:  Gastroesophageal Reflux Disease with Current  Symptoms     gabapentin 800 MG tablet  Commonly known as:  NEURONTIN  Take 1 tablet (800 mg total) by mouth 3 (three) times daily.   Indication:  Neuropathic Pain, Pain     lidocaine 5 %  Commonly known as:  LIDODERM  Place 1 patch onto the skin daily. Remove & Discard patch within 12 hours or as directed by MD   Indication:  neck pain     multivitamin with minerals Tabs tablet  Take 1 tablet by mouth daily.    nutritional supplement   propranolol 10 MG tablet  Commonly known as:  INDERAL  Take 1 tablet (10 mg total) by mouth daily.   Indication:  High Blood Pressure     simvastatin 10 MG tablet  Commonly known as:  ZOCOR  Take 1 tablet (10 mg total) by mouth at bedtime.   Indication:  Type II A Hyperlipidemia     SUMAtriptan 50 MG tablet  Commonly known as:  IMITREX  Take 1 tablet (50 mg total) by mouth every 2 (two) hours as needed for migraine.   Indication:  Headache     traMADol 50 MG tablet  Commonly known as:  ULTRAM  Take 1 tablet (50 mg total) by mouth at bedtime.   Indication:  Moderate to Moderately Severe Pain     traZODone 100 MG tablet  Commonly known as:  DESYREL  Take 2 tablets (200 mg total) by mouth at bedtime.   Indication:  Trouble Sleeping, Major Depressive Disorder           Follow-up Information   Follow up with [facility] On 05/18/2013. (Walk in on this date for hospital discharge appointment.  Walk in clinic is Monday - Friday 8 am - 3 pm. )    Contact information:   [address] [place] [address] Phone: [phone] Fax: [phone]      Follow up with [facility]  On 05/18/2013. (Walk in for hospital discharge appointment Monday - Friday 8 am - 3 pm. )    Contact information:   [address] [facility] [place], [place] [address] Phone: [phone] Fax:[phone]      Follow-up recommendations:   Activities: Resume activity as tolerated. Diet: Heart healthy low sodium diet Tests: Follow up testing will be determined by your out  patient provider.  Comments:    Total Discharge Time:  Greater than 30 minutes.  Signed: Rona Ravens. Mashburn RPAC 11:03 AM 05/17/2013  Patient was evaluated, developed treatment and case discussed with treatment team. Reviewed the information documented and agree with the treatment plan.   Shanera Meske,JANARDHAHA R. 05/20/2013 9:02 PM

## 2013-05-17 NOTE — Progress Notes (Signed)
Patient ID: Jeffrey [name], male   DOB: 06-Mar-1961, 52 y.o.   MRN: [id number] D:  Discharged to home.  Denies suicidal ideation.  All belongings retrieved from room and from locker in the search room.   A:  Reviewed all discharge instructions, medications, and follow up care.  Patient was given a three day supply of medications from the hospital pharmacy and prescriptions for renewals of all psych medicines.  He was also given a bus pass to get to [facility] main campus where his vehicle is.   R:  Verbalized understanding of all discharge instructions.  Patient was escorted off the unit and out the the front lobby.  He was given instructions on how to get to the bus stop.

## 2013-05-18 NOTE — Progress Notes (Signed)
Patient Discharge Instructions:  After Visit Summary (AVS):   Faxed to:  05/18/13 Discharge Summary Note:   Faxed to:  05/18/13 Psychiatric Admission Assessment Note:   Faxed to:  05/18/13 Suicide Risk Assessment - Discharge Assessment:   Faxed to:  05/18/13 Faxed/Sent to the Next Level Care provider:  05/18/13 Faxed to [facility] @ [phone] Faxed to [facility] @ [phone]  Jerelene Redden, 05/18/2013, 3:49 PM

## 2014-08-05 ENCOUNTER — Emergency Department (HOSPITAL_COMMUNITY)
Admission: EM | Admit: 2014-08-05 | Discharge: 2014-08-05 | Disposition: A | Discharge status: Home / Self Care | Payer: Medicaid Other | Attending: Emergency Medicine | Admitting: Emergency Medicine

## 2014-08-05 ENCOUNTER — Emergency Department (HOSPITAL_COMMUNITY): Payer: Medicaid Other

## 2014-08-05 ENCOUNTER — Encounter (HOSPITAL_COMMUNITY): Payer: Self-pay | Admitting: Emergency Medicine

## 2014-08-05 DIAGNOSIS — F419 Anxiety disorder, unspecified: Secondary | ICD-10-CM | POA: Diagnosis not present

## 2014-08-05 DIAGNOSIS — Z79899 Other long term (current) drug therapy: Secondary | ICD-10-CM | POA: Diagnosis not present

## 2014-08-05 DIAGNOSIS — I10 Essential (primary) hypertension: Secondary | ICD-10-CM | POA: Insufficient documentation

## 2014-08-05 DIAGNOSIS — J069 Acute upper respiratory infection, unspecified: Secondary | ICD-10-CM | POA: Diagnosis not present

## 2014-08-05 DIAGNOSIS — F329 Major depressive disorder, single episode, unspecified: Secondary | ICD-10-CM | POA: Insufficient documentation

## 2014-08-05 DIAGNOSIS — R05 Cough: Secondary | ICD-10-CM

## 2014-08-05 DIAGNOSIS — J441 Chronic obstructive pulmonary disease with (acute) exacerbation: Secondary | ICD-10-CM | POA: Insufficient documentation

## 2014-08-05 DIAGNOSIS — R059 Cough, unspecified: Secondary | ICD-10-CM

## 2014-08-05 DIAGNOSIS — G8929 Other chronic pain: Secondary | ICD-10-CM | POA: Diagnosis not present

## 2014-08-05 DIAGNOSIS — Z720 Tobacco use: Secondary | ICD-10-CM | POA: Insufficient documentation

## 2014-08-05 DIAGNOSIS — K219 Gastro-esophageal reflux disease without esophagitis: Secondary | ICD-10-CM | POA: Insufficient documentation

## 2014-08-05 HISTORY — DX: Essential (primary) hypertension: I10

## 2014-08-05 HISTORY — DX: Chronic obstructive pulmonary disease, unspecified: J44.9

## 2014-08-05 LAB — CBC WITH DIFFERENTIAL/PLATELET
BASOS ABS: 0 10*3/uL (ref 0.0–0.1)
Basophils Relative: 0 % (ref 0–1)
EOS ABS: 0.1 10*3/uL (ref 0.0–0.7)
Eosinophils Relative: 2 % (ref 0–5)
HCT: 42.8 % (ref 39.0–52.0)
Hemoglobin: 14.4 g/dL (ref 13.0–17.0)
LYMPHS ABS: 1.9 10*3/uL (ref 0.7–4.0)
LYMPHS PCT: 28 % (ref 12–46)
MCH: 29.9 pg (ref 26.0–34.0)
MCHC: 33.6 g/dL (ref 30.0–36.0)
MCV: 88.8 fL (ref 78.0–100.0)
Monocytes Absolute: 0.7 10*3/uL (ref 0.1–1.0)
Monocytes Relative: 10 % (ref 3–12)
NEUTROS PCT: 60 % (ref 43–77)
Neutro Abs: 4.2 10*3/uL (ref 1.7–7.7)
PLATELETS: 133 10*3/uL — AB (ref 150–400)
RBC: 4.82 MIL/uL (ref 4.22–5.81)
RDW: 13.4 % (ref 11.5–15.5)
WBC: 6.9 10*3/uL (ref 4.0–10.5)

## 2014-08-05 LAB — BASIC METABOLIC PANEL
ANION GAP: 13 (ref 5–15)
BUN: 12 mg/dL (ref 6–23)
CALCIUM: 9.5 mg/dL (ref 8.4–10.5)
CO2: 24 mEq/L (ref 19–32)
Chloride: 100 mEq/L (ref 96–112)
Creatinine, Ser: 1.07 mg/dL (ref 0.50–1.35)
GFR calc Af Amer: 88 mL/min — ABNORMAL LOW (ref >90)
GFR, EST NON AFRICAN AMERICAN: 76 mL/min — AB (ref >90)
GLUCOSE: 93 mg/dL (ref 70–99)
POTASSIUM: 4.1 meq/L (ref 3.7–5.3)
SODIUM: 137 meq/L (ref 137–147)

## 2014-08-05 LAB — URINALYSIS, ROUTINE W REFLEX MICROSCOPIC
Bilirubin Urine: NEGATIVE
GLUCOSE, UA: NEGATIVE mg/dL
Hgb urine dipstick: NEGATIVE
KETONES UR: NEGATIVE mg/dL
LEUKOCYTES UA: NEGATIVE
NITRITE: NEGATIVE
PROTEIN: NEGATIVE mg/dL
Specific Gravity, Urine: 1.006 (ref 1.005–1.030)
UROBILINOGEN UA: 0.2 mg/dL (ref 0.0–1.0)
pH: 7 (ref 5.0–8.0)

## 2014-08-05 LAB — I-STAT TROPONIN, ED: TROPONIN I, POC: 0.01 ng/mL (ref 0.00–0.08)

## 2014-08-05 MED ORDER — DEXTROMETHORPHAN POLISTIREX 30 MG/5ML PO LQCR: 30.0000 mg | ORAL | Status: AC | PRN

## 2014-08-05 MED ORDER — ALBUTEROL SULFATE HFA 108 (90 BASE) MCG/ACT IN AERS
2.0000 | INHALATION_SPRAY | RESPIRATORY_TRACT | Status: DC | PRN
  Administered 2014-08-05: 2 via RESPIRATORY_TRACT
  Filled 2014-08-05: qty 6.7

## 2014-08-05 NOTE — ED Provider Notes (Signed)
CSN: [id number]     Arrival date & time 08/05/14  2014 History   First MD Initiated Contact with Patient 08/05/14 2020     Chief Complaint  Patient presents with  . Fever  . Cough    Productive Cough     (Consider location/radiation/quality/duration/timing/severity/associated sxs/prior Treatment) HPI Comments: [facility] with a chief complaint of cough and fever. Per EMS, the patient has run a fever to 101.5. A she complains of yellow productive cough for the past several days. He reports some associated shortness of breath, but denies any chest pain. He has a history of COPD. He has not taken anything to alleviate his symptoms. He denies any nausea, vomiting, diarrhea, constipation. He denies any other symptoms at this time. There are no aggravating or alleviating factors.  The history is provided by the patient. No language interpreter was used.    Past Medical History  Diagnosis Date  . Chronic pain   . Depression   . Anxiety   . Suicidal overdose   . GERD (gastroesophageal reflux disease)   . Muscle spasms 05/09/2013    Right trapezoid  . COPD (chronic obstructive pulmonary disease)   . Hypertension    Past Surgical History  Procedure Laterality Date  . Ankle surgery    . Hernia repair    . Neck fusion     History reviewed. No pertinent family history. History  Substance Use Topics  . Smoking status: Current Every Day Smoker -- 0.25 packs/day for 39 years    Types: Cigarettes  . Smokeless tobacco: Not on file  . Alcohol Use: No     Comment: quit in 2003    Review of Systems  Constitutional: Negative for fever and chills.  Respiratory: Positive for cough and shortness of breath.   Cardiovascular: Negative for chest pain.  Gastrointestinal: Negative for nausea, vomiting, diarrhea and constipation.  Genitourinary: Negative for dysuria.  All other systems reviewed and are negative.     Allergies   Abilify; Ativan; Celexa; Effexor; Levaquin; Lexapro; Lipitor; Nsaids; Prozac; Toradol; Tylenol; Valium; and Wellbutrin  Home Medications   Prior to Admission medications   Medication Sig Start Date End Date Taking? Authorizing Provider  cetirizine (ZYRTEC) 10 MG tablet Take 1 tablet (10 mg total) by mouth at bedtime. 04/05/13   Fransisca KaufmannLaura Davis, NP  Coenzyme Q10 (COQ10) 100 MG CAPS Take 1 capsule by mouth at bedtime. 04/05/13   Fransisca KaufmannLaura Davis, NP  cyclobenzaprine (FLEXERIL) 5 MG tablet Take 5 mg by mouth 3 (three) times daily as needed for muscle spasms.    Historical Provider, MD  desvenlafaxine (PRISTIQ) 100 MG 24 hr tablet Take 1 tablet (100 mg total) by mouth daily. For depression 05/17/13   Verne SpurrNeil Mashburn, PA-C  esomeprazole (NEXIUM) 40 MG capsule Take 1 capsule (40 mg total) by mouth daily before breakfast. 04/05/13   Fransisca KaufmannLaura Davis, NP  gabapentin (NEURONTIN) 800 MG tablet Take 1 tablet (800 mg total) by mouth 3 (three) times daily. 05/17/13   Verne SpurrNeil Mashburn, PA-C  lidocaine (LIDODERM) 5 % Place 1 patch onto the skin daily. Remove & Discard patch within 12 hours or as directed by MD 04/05/13   Fransisca KaufmannLaura Davis, NP  Multiple Vitamin (MULTIVITAMIN WITH MINERALS) TABS tablet Take 1 tablet by mouth daily. 05/17/13   Verne SpurrNeil Mashburn, PA-C  propranolol (INDERAL) 10 MG tablet Take 1 tablet (10 mg total) by mouth daily. 04/05/13   Fransisca KaufmannLaura Davis, NP  simvastatin (ZOCOR) 10  MG tablet Take 1 tablet (10 mg total) by mouth at bedtime. 04/05/13   Fransisca Kaufmann, NP  SUMAtriptan (IMITREX) 50 MG tablet Take 1 tablet (50 mg total) by mouth every 2 (two) hours as needed for migraine. 04/05/13   Fransisca Kaufmann, NP  traMADol (ULTRAM) 50 MG tablet Take 1 tablet (50 mg total) by mouth at bedtime. 05/17/13   Verne Spurr, PA-C  traZODone (DESYREL) 100 MG tablet Take 2 tablets (200 mg total) by mouth at bedtime. 05/17/13   Verne Spurr, PA-C   BP 140/96  Pulse 95  Temp(Src) 100.5 F (38.1 C) (Rectal)  Resp 16  SpO2 99% Physical Exam  Nursing note  and vitals reviewed. Constitutional: He is oriented to person, place, and time. He appears well-developed and well-nourished.  HENT:  Head: Normocephalic and atraumatic.  Eyes: Conjunctivae and EOM are normal. Pupils are equal, round, and reactive to light. Right eye exhibits no discharge. Left eye exhibits no discharge. No scleral icterus.  Neck: Normal range of motion. Neck supple. No JVD present.  Cardiovascular: Normal rate, regular rhythm and normal heart sounds.  Exam reveals no gallop and no friction rub.   No murmur heard. Pulmonary/Chest: Effort normal. No respiratory distress. He has no wheezes. He has rales. He exhibits no tenderness.  Mild rales  Abdominal: Soft. He exhibits no distension and no mass. There is no tenderness. There is no rebound and no guarding.  Musculoskeletal: Normal range of motion. He exhibits no edema and no tenderness.  Neurological: He is alert and oriented to person, place, and time.  Skin: Skin is warm and dry.  Psychiatric: He has a normal mood and affect. His behavior is normal. Judgment and thought content normal.    ED Course  Procedures (including critical care time) Results for orders placed during the hospital encounter of 08/05/14  CBC WITH DIFFERENTIAL      Result Value Ref Range   WBC 6.9  4.0 - 10.5 K/uL   RBC 4.82  4.22 - 5.81 MIL/uL   Hemoglobin 14.4  13.0 - 17.0 g/dL   HCT 78.2  95.6 - 21.3 %   MCV 88.8  78.0 - 100.0 fL   MCH 29.9  26.0 - 34.0 pg   MCHC 33.6  30.0 - 36.0 g/dL   RDW 08.6  57.8 - 46.9 %   Platelets 133 (*) 150 - 400 K/uL   Neutrophils Relative % 60  43 - 77 %   Neutro Abs 4.2  1.7 - 7.7 K/uL   Lymphocytes Relative 28  12 - 46 %   Lymphs Abs 1.9  0.7 - 4.0 K/uL   Monocytes Relative 10  3 - 12 %   Monocytes Absolute 0.7  0.1 - 1.0 K/uL   Eosinophils Relative 2  0 - 5 %   Eosinophils Absolute 0.1  0.0 - 0.7 K/uL   Basophils Relative 0  0 - 1 %   Basophils Absolute 0.0  0.0 - 0.1 K/uL  BASIC METABOLIC PANEL       Result Value Ref Range   Sodium 137  137 - 147 mEq/L   Potassium 4.1  3.7 - 5.3 mEq/L   Chloride 100  96 - 112 mEq/L   CO2 24  19 - 32 mEq/L   Glucose, Bld 93  70 - 99 mg/dL   BUN 12  6 - 23 mg/dL   Creatinine, Ser 6.29  0.50 - 1.35 mg/dL   Calcium 9.5  8.4 - 52.8 mg/dL  GFR calc non Af Amer 76 (*) >90 mL/min   GFR calc Af Amer 88 (*) >90 mL/min   Anion gap 13  5 - 15  URINALYSIS, ROUTINE W REFLEX MICROSCOPIC      Result Value Ref Range   Color, Urine YELLOW  YELLOW   APPearance CLEAR  CLEAR   Specific Gravity, Urine 1.006  1.005 - 1.030   pH 7.0  5.0 - 8.0   Glucose, UA NEGATIVE  NEGATIVE mg/dL   Hgb urine dipstick NEGATIVE  NEGATIVE   Bilirubin Urine NEGATIVE  NEGATIVE   Ketones, ur NEGATIVE  NEGATIVE mg/dL   Protein, ur NEGATIVE  NEGATIVE mg/dL   Urobilinogen, UA 0.2  0.0 - 1.0 mg/dL   Nitrite NEGATIVE  NEGATIVE   Leukocytes, UA NEGATIVE  NEGATIVE   Dg Chest 2 View  08/05/2014   CLINICAL DATA:  Fever and cough for 2 days  EXAM: CHEST  2 VIEW  COMPARISON:  Chest radiograph June 05, 2009 and chest CT and June 06, 2009  FINDINGS: There is a degree of underlying emphysematous change. There is no edema or consolidation. Heart size is normal. Pulmonary vascularity reflects underlying emphysema. No adenopathy. There is degenerative change in the thoracic spine. There is postoperative change in the lower cervical region.  IMPRESSION: No edema or consolidation. There is a degree of underlying emphysematous change.   Electronically Signed   By: Bretta BangWilliam  Woodruff M.D.   On: 08/05/2014 20:59     Imaging Review No results found.   EKG Interpretation None     ED ECG REPORT  I personally interpreted this EKG   Date: 08/05/2014   Rate:89  Rhythm: normal sinus rhythm  QRS Axis: normal  Intervals: normal  ST/T Wave abnormalities: normal  Conduction Disutrbances:none  Narrative Interpretation:   Old EKG Reviewed: none available    MDM   Final diagnoses:  Cough  URI  (upper respiratory infection)    Patient with cough and fever. Will check labs and chest x-ray. Will reassess.  10:09 PM Chest x-ray and urine are negative for infection. Labs are reassuring. Troponin and EKG are unremarkable. Plan for treatment of URI with symptomatic control. Discharge to home. Patient understands and agrees with the plan. He is stable for discharge.  Filed Vitals:   08/05/14 2216  BP: 145/96  Pulse: 87  Temp: 98.2 F (36.8 C)  Resp: [address]       Keeleigh Terris, [place] JerseyPA-C 08/05/14 2220

## 2014-08-05 NOTE — ED Notes (Signed)
Bed: ZO10WA19 Expected date:  Expected time:  Means of arrival:  Comments: EMS/53 yo male with lowgrade fever/ambulatory

## 2014-08-05 NOTE — ED Notes (Signed)
[facility] with c/o of fever 101.5 and productive cough, remarks on yellow significant amount of phlegm. Hx of COPD, denies chest pain or shortness of breadth. Pt in NAD.

## 2014-08-05 NOTE — Discharge Instructions (Signed)
Upper Respiratory Infection, Adult An upper respiratory infection (URI) is also known as the common cold. It is often caused by a type of germ (virus). Colds are easily spread (contagious). You can pass it to others by kissing, coughing, sneezing, or drinking out of the same glass. Usually, you get better in 1 or 2 weeks.  HOME CARE   Only take medicine as told by your doctor.  Use a warm mist humidifier or breathe in steam from a hot shower.  Drink enough water and fluids to keep your pee (urine) clear or pale yellow.  Get plenty of rest.  Return to work when your temperature is back to normal or as told by your doctor. You may use a face mask and wash your hands to stop your cold from spreading. GET HELP RIGHT AWAY IF:   After the first few days, you feel you are getting worse.  You have questions about your medicine.  You have chills, shortness of breath, or brown or red spit (mucus).  You have yellow or brown snot (nasal discharge) or pain in the face, especially when you bend forward.  You have a fever, puffy (swollen) neck, pain when you swallow, or white spots in the back of your throat.  You have a bad headache, ear pain, sinus pain, or chest pain.  You have a high-pitched whistling sound when you breathe in and out (wheezing).  You have a lasting cough or cough up blood.  You have sore muscles or a stiff neck. MAKE SURE YOU:   Understand these instructions.  Will watch your condition.  Will get help right away if you are not doing well or get worse. Document Released: 03/10/2008 Document Revised: 12/15/2011 Document Reviewed: 12/28/2013 ExitCare Patient Information 2015 [employer]. This information is not intended to replace advice given to you by your health care provider. Make sure you discuss any questions you have with your health care provider.  

## 2014-08-07 NOTE — ED Notes (Signed)
Pt's chart accessed after call from pharmacy regarding frequency of medication (delsym) prescribed, per Dr Jeraldine LootsLockwood pt is to take this medication as prescribed every eight hours as needed.

## 2014-11-26 ENCOUNTER — Emergency Department (HOSPITAL_COMMUNITY): Payer: Medicaid Other

## 2014-11-26 ENCOUNTER — Encounter (HOSPITAL_COMMUNITY): Payer: Self-pay

## 2014-11-26 ENCOUNTER — Emergency Department (HOSPITAL_COMMUNITY)
Admission: EM | Admit: 2014-11-26 | Discharge: 2014-11-26 | Disposition: A | Discharge status: Skilled Nursing Facility | Payer: Medicaid Other | Source: Home / Self Care | Attending: Emergency Medicine | Admitting: Emergency Medicine

## 2014-11-26 ENCOUNTER — Encounter (HOSPITAL_COMMUNITY): Payer: Self-pay | Admitting: Emergency Medicine

## 2014-11-26 ENCOUNTER — Inpatient Hospital Stay (HOSPITAL_COMMUNITY)
Admission: EM | Admit: 2014-11-26 | Discharge: 2014-11-30 | DRG: 391 | Disposition: A | Discharge status: Nursing Home (Includes ICF) | Payer: Medicaid Other | Attending: Internal Medicine | Admitting: Internal Medicine

## 2014-11-26 DIAGNOSIS — Z720 Tobacco use: Secondary | ICD-10-CM | POA: Insufficient documentation

## 2014-11-26 DIAGNOSIS — K210 Gastro-esophageal reflux disease with esophagitis: Secondary | ICD-10-CM | POA: Diagnosis present

## 2014-11-26 DIAGNOSIS — K226 Gastro-esophageal laceration-hemorrhage syndrome: Secondary | ICD-10-CM | POA: Diagnosis present

## 2014-11-26 DIAGNOSIS — R112 Nausea with vomiting, unspecified: Secondary | ICD-10-CM | POA: Diagnosis present

## 2014-11-26 DIAGNOSIS — R05 Cough: Secondary | ICD-10-CM | POA: Insufficient documentation

## 2014-11-26 DIAGNOSIS — R059 Cough, unspecified: Secondary | ICD-10-CM

## 2014-11-26 DIAGNOSIS — F329 Major depressive disorder, single episode, unspecified: Secondary | ICD-10-CM | POA: Insufficient documentation

## 2014-11-26 DIAGNOSIS — Z8249 Family history of ischemic heart disease and other diseases of the circulatory system: Secondary | ICD-10-CM | POA: Diagnosis not present

## 2014-11-26 DIAGNOSIS — Z981 Arthrodesis status: Secondary | ICD-10-CM

## 2014-11-26 DIAGNOSIS — R61 Generalized hyperhidrosis: Secondary | ICD-10-CM | POA: Insufficient documentation

## 2014-11-26 DIAGNOSIS — K2980 Duodenitis without bleeding: Secondary | ICD-10-CM | POA: Diagnosis present

## 2014-11-26 DIAGNOSIS — R197 Diarrhea, unspecified: Secondary | ICD-10-CM | POA: Insufficient documentation

## 2014-11-26 DIAGNOSIS — R509 Fever, unspecified: Secondary | ICD-10-CM | POA: Insufficient documentation

## 2014-11-26 DIAGNOSIS — J441 Chronic obstructive pulmonary disease with (acute) exacerbation: Secondary | ICD-10-CM | POA: Insufficient documentation

## 2014-11-26 DIAGNOSIS — G8929 Other chronic pain: Secondary | ICD-10-CM | POA: Insufficient documentation

## 2014-11-26 DIAGNOSIS — J449 Chronic obstructive pulmonary disease, unspecified: Secondary | ICD-10-CM | POA: Diagnosis present

## 2014-11-26 DIAGNOSIS — R630 Anorexia: Secondary | ICD-10-CM | POA: Insufficient documentation

## 2014-11-26 DIAGNOSIS — K219 Gastro-esophageal reflux disease without esophagitis: Secondary | ICD-10-CM | POA: Insufficient documentation

## 2014-11-26 DIAGNOSIS — R0602 Shortness of breath: Secondary | ICD-10-CM

## 2014-11-26 DIAGNOSIS — E872 Acidosis, unspecified: Secondary | ICD-10-CM | POA: Diagnosis present

## 2014-11-26 DIAGNOSIS — K269 Duodenal ulcer, unspecified as acute or chronic, without hemorrhage or perforation: Secondary | ICD-10-CM | POA: Diagnosis present

## 2014-11-26 DIAGNOSIS — Z7951 Long term (current) use of inhaled steroids: Secondary | ICD-10-CM | POA: Insufficient documentation

## 2014-11-26 DIAGNOSIS — Z79899 Other long term (current) drug therapy: Secondary | ICD-10-CM

## 2014-11-26 DIAGNOSIS — K920 Hematemesis: Secondary | ICD-10-CM | POA: Diagnosis present

## 2014-11-26 DIAGNOSIS — Z87891 Personal history of nicotine dependence: Secondary | ICD-10-CM | POA: Diagnosis not present

## 2014-11-26 DIAGNOSIS — R531 Weakness: Secondary | ICD-10-CM | POA: Insufficient documentation

## 2014-11-26 DIAGNOSIS — I10 Essential (primary) hypertension: Secondary | ICD-10-CM | POA: Diagnosis present

## 2014-11-26 DIAGNOSIS — R079 Chest pain, unspecified: Secondary | ICD-10-CM | POA: Diagnosis present

## 2014-11-26 DIAGNOSIS — K2960 Other gastritis without bleeding: Secondary | ICD-10-CM | POA: Diagnosis present

## 2014-11-26 DIAGNOSIS — R0981 Nasal congestion: Secondary | ICD-10-CM | POA: Insufficient documentation

## 2014-11-26 DIAGNOSIS — F419 Anxiety disorder, unspecified: Secondary | ICD-10-CM | POA: Insufficient documentation

## 2014-11-26 DIAGNOSIS — Z86711 Personal history of pulmonary embolism: Secondary | ICD-10-CM

## 2014-11-26 DIAGNOSIS — R1084 Generalized abdominal pain: Secondary | ICD-10-CM

## 2014-11-26 DIAGNOSIS — G894 Chronic pain syndrome: Secondary | ICD-10-CM | POA: Diagnosis present

## 2014-11-26 DIAGNOSIS — R1110 Vomiting, unspecified: Secondary | ICD-10-CM

## 2014-11-26 DIAGNOSIS — E860 Dehydration: Secondary | ICD-10-CM | POA: Diagnosis present

## 2014-11-26 LAB — URINALYSIS, ROUTINE W REFLEX MICROSCOPIC
BILIRUBIN URINE: NEGATIVE
GLUCOSE, UA: NEGATIVE mg/dL
Hgb urine dipstick: NEGATIVE
Ketones, ur: >80 mg/dL — AB
LEUKOCYTES UA: NEGATIVE
NITRITE: NEGATIVE
PROTEIN: NEGATIVE mg/dL
Specific Gravity, Urine: >1.046 — ABNORMAL HIGH (ref 1.005–1.030)
Urobilinogen, UA: 0.2 mg/dL (ref 0.0–1.0)
pH: 5.5 (ref 5.0–8.0)

## 2014-11-26 LAB — CBC
HCT: 52.6 % — ABNORMAL HIGH (ref 39.0–52.0)
Hemoglobin: 18.6 g/dL — ABNORMAL HIGH (ref 13.0–17.0)
MCH: 30.7 pg (ref 26.0–34.0)
MCHC: 35.4 g/dL (ref 30.0–36.0)
MCV: 86.8 fL (ref 78.0–100.0)
PLATELETS: 183 10*3/uL (ref 150–400)
RBC: 6.06 MIL/uL — AB (ref 4.22–5.81)
RDW: 13 % (ref 11.5–15.5)
WBC: 13.7 10*3/uL — ABNORMAL HIGH (ref 4.0–10.5)

## 2014-11-26 LAB — CBC WITH DIFFERENTIAL/PLATELET
BASOS ABS: 0 10*3/uL (ref 0.0–0.1)
Basophils Relative: 0 % (ref 0–1)
Eosinophils Absolute: 0.1 10*3/uL (ref 0.0–0.7)
Eosinophils Relative: 1 % (ref 0–5)
HCT: 54.9 % — ABNORMAL HIGH (ref 39.0–52.0)
Hemoglobin: 19.4 g/dL — ABNORMAL HIGH (ref 13.0–17.0)
LYMPHS ABS: 2 10*3/uL (ref 0.7–4.0)
Lymphocytes Relative: 18 % (ref 12–46)
MCH: 31 pg (ref 26.0–34.0)
MCHC: 35.3 g/dL (ref 30.0–36.0)
MCV: 87.7 fL (ref 78.0–100.0)
MONO ABS: 0.8 10*3/uL (ref 0.1–1.0)
Monocytes Relative: 7 % (ref 3–12)
Neutro Abs: 8.3 10*3/uL — ABNORMAL HIGH (ref 1.7–7.7)
Neutrophils Relative %: 74 % (ref 43–77)
Platelets: 163 10*3/uL (ref 150–400)
RBC: 6.26 MIL/uL — AB (ref 4.22–5.81)
RDW: 13 % (ref 11.5–15.5)
WBC: 11.2 10*3/uL — AB (ref 4.0–10.5)

## 2014-11-26 LAB — COMPREHENSIVE METABOLIC PANEL
ALT: 26 U/L (ref 0–53)
AST: 25 U/L (ref 0–37)
Albumin: 4.6 g/dL (ref 3.5–5.2)
Alkaline Phosphatase: 76 U/L (ref 39–117)
Anion gap: 10 (ref 5–15)
BILIRUBIN TOTAL: 1.2 mg/dL (ref 0.3–1.2)
BUN: 27 mg/dL — ABNORMAL HIGH (ref 6–23)
CALCIUM: 9 mg/dL (ref 8.4–10.5)
CO2: 17 mmol/L — ABNORMAL LOW (ref 19–32)
CREATININE: 1.22 mg/dL (ref 0.50–1.35)
Chloride: 106 mmol/L (ref 96–112)
GFR calc non Af Amer: 65 mL/min — ABNORMAL LOW (ref >90)
GFR, EST AFRICAN AMERICAN: 75 mL/min — AB (ref >90)
Glucose, Bld: 109 mg/dL — ABNORMAL HIGH (ref 70–99)
POTASSIUM: 4.4 mmol/L (ref 3.5–5.1)
Sodium: 133 mmol/L — ABNORMAL LOW (ref 135–145)
Total Protein: 8.5 g/dL — ABNORMAL HIGH (ref 6.0–8.3)

## 2014-11-26 LAB — D-DIMER, QUANTITATIVE (NOT AT ARMC): D-Dimer, Quant: 1.11 ug/mL-FEU — ABNORMAL HIGH (ref 0.00–0.48)

## 2014-11-26 LAB — LIPASE, BLOOD: LIPASE: 23 U/L (ref 11–59)

## 2014-11-26 LAB — POC OCCULT BLOOD, ED: Fecal Occult Bld: NEGATIVE

## 2014-11-26 MED ORDER — IOHEXOL 300 MG/ML  SOLN
100.0000 mL | Freq: Once | INTRAMUSCULAR | Status: AC | PRN
  Administered 2014-11-26: 80 mL via INTRAVENOUS

## 2014-11-26 MED ORDER — ONDANSETRON HCL 4 MG/2ML IJ SOLN
4.0000 mg | Freq: Once | INTRAMUSCULAR | Status: AC
  Administered 2014-11-26: 4 mg via INTRAVENOUS
  Filled 2014-11-26: qty 2

## 2014-11-26 MED ORDER — SODIUM CHLORIDE 0.9 % IV BOLUS (SEPSIS)
1000.0000 mL | Freq: Once | INTRAVENOUS | Status: AC
  Administered 2014-11-26: 1000 mL via INTRAVENOUS

## 2014-11-26 MED ORDER — IOHEXOL 350 MG/ML SOLN
100.0000 mL | Freq: Once | INTRAVENOUS | Status: AC | PRN
  Administered 2014-11-26: 100 mL via INTRAVENOUS

## 2014-11-26 MED ORDER — PANTOPRAZOLE SODIUM 40 MG IV SOLR
40.0000 mg | Freq: Once | INTRAVENOUS | Status: AC
  Administered 2014-11-26: 40 mg via INTRAVENOUS
  Filled 2014-11-26: qty 40

## 2014-11-26 MED ORDER — PROMETHAZINE HCL 25 MG PO TABS: 25.0000 mg | ORAL_TABLET | Freq: Four times a day (QID) | ORAL | Status: AC | PRN

## 2014-11-26 MED ORDER — PROMETHAZINE HCL 25 MG/ML IJ SOLN
25.0000 mg | Freq: Once | INTRAMUSCULAR | Status: AC
  Administered 2014-11-26: 25 mg via INTRAVENOUS
  Filled 2014-11-26: qty 1

## 2014-11-26 MED ORDER — HYDROMORPHONE HCL 1 MG/ML IJ SOLN
1.0000 mg | Freq: Once | INTRAMUSCULAR | Status: AC
  Administered 2014-11-26: 1 mg via INTRAVENOUS
  Filled 2014-11-26: qty 1

## 2014-11-26 MED ORDER — THIAMINE HCL 100 MG/ML IJ SOLN
100.0000 mg | Freq: Once | INTRAMUSCULAR | Status: AC
  Administered 2014-11-26: 100 mg via INTRAVENOUS
  Filled 2014-11-26: qty 2

## 2014-11-26 MED ORDER — IOHEXOL 300 MG/ML  SOLN: 50.0000 mL | Freq: Once | INTRAMUSCULAR | Status: AC | PRN

## 2014-11-26 NOTE — ED Notes (Signed)
He declines both food/drink at this time citing "just don't feel good".

## 2014-11-26 NOTE — ED Notes (Signed)
Bed: WA04 Expected date: 11/26/14 Expected time: 5:58 PM Means of arrival: Ambulance Comments: Vomiting coffee ground emesis, seen here earlier

## 2014-11-26 NOTE — ED Notes (Signed)
Bed: [id number] Expected date: 11/26/14 Expected time: 7:46 AM Means of arrival: Ambulance Comments: Weakness, not feeling well

## 2014-11-26 NOTE — ED Provider Notes (Signed)
CSN: [id number]     Arrival date & time 11/26/14  1803 History   First MD Initiated Contact with Patient 11/26/14 1806     Chief Complaint  Patient presents with  . Emesis  . Abdominal Pain     (Consider location/radiation/quality/duration/timing/severity/associated sxs/prior Treatment) Patient is a 54 y.o. male presenting with vomiting and abdominal pain. The history is provided by the patient.  Emesis Severity:  Moderate Timing:  Constant Quality:  Stomach contents Progression:  Worsening Chronicity:  New Relieved by:  Nothing Worsened by:  Nothing tried Associated symptoms: abdominal pain   Associated symptoms: no fever, no headaches and no URI   Abdominal Pain Associated symptoms: nausea and vomiting   Associated symptoms: no cough, no fever and no shortness of breath     Past Medical History  Diagnosis Date  . Chronic pain   . Depression   . Anxiety   . Suicidal overdose   . GERD (gastroesophageal reflux disease)   . Muscle spasms 05/09/2013    Right trapezoid  . COPD (chronic obstructive pulmonary disease)   . Hypertension    Past Surgical History  Procedure Laterality Date  . Ankle surgery    . Hernia repair    . Neck fusion     No family history on file. History  Substance Use Topics  . Smoking status: Current Every Day Smoker -- 0.25 packs/day for 39 years    Types: Cigarettes  . Smokeless tobacco: Not on file  . Alcohol Use: No     Comment: quit in 2003    Review of Systems  Constitutional: Negative for fever.  Respiratory: Negative for cough and shortness of breath.   Gastrointestinal: Positive for nausea, vomiting and abdominal pain.  Neurological: Negative for headaches.  All other systems reviewed and are negative.     Allergies  Abilify; Ativan; Celexa; Effexor; Levaquin; Lexapro; Lipitor; Nsaids; Prozac; Toradol; Tylenol; Valium; and Wellbutrin  Home Medications   Prior to Admission medications   Medication Sig Start Date End Date  Taking? Authorizing Provider  albuterol (PROVENTIL HFA;VENTOLIN HFA) 108 (90 BASE) MCG/ACT inhaler Inhale 2 puffs into the lungs every 6 (six) hours as needed for wheezing or shortness of breath.    Historical Provider, MD  cyclobenzaprine (FLEXERIL) 5 MG tablet Take 5 mg by mouth 3 (three) times daily.     Historical Provider, MD  desvenlafaxine (PRISTIQ) 100 MG 24 hr tablet Take 1 tablet (100 mg total) by mouth daily. For depression 05/17/13   Verne SpurrNeil Mashburn, PA-C  dextromethorphan (DELSYM) 30 MG/5ML liquid Take 5 mLs (30 mg total) by mouth as needed for cough. Patient not taking: Reported on 11/26/2014 08/05/14   Roxy Horsemanobert Browning, PA-C  dicyclomine (BENTYL) 20 MG tablet Take 20 mg by mouth 4 (four) times daily -  before meals and at bedtime.    Historical Provider, MD  fluticasone (FLONASE) 50 MCG/ACT nasal spray Place 2 sprays into both nostrils daily.    Historical Provider, MD  gabapentin (NEURONTIN) 400 MG capsule Take 800 mg by mouth 3 (three) times daily.    Historical Provider, MD  hydrOXYzine (VISTARIL) 25 MG capsule Take 25-50 mg by mouth 2 (two) times daily. 1 cap in AM and 2 caps in PM    Historical Provider, MD  lidocaine (LIDODERM) 5 % Place 1 patch onto the skin daily. Remove & Discard patch within 12 hours or as directed by MD 04/05/13   Fransisca KaufmannLaura Davis, NP  loperamide (IMODIUM) 2 MG capsule Take 2 mg  by mouth 4 (four) times daily as needed for diarrhea or loose stools.    Historical Provider, MD  loratadine (CLARITIN) 10 MG tablet Take 10 mg by mouth daily.    Historical Provider, MD  Multiple Vitamin (MULTIVITAMIN WITH MINERALS) TABS tablet Take 1 tablet by mouth daily. 05/17/13   Verne Spurr, PA-C  promethazine (PHENERGAN) 25 MG tablet Take 1 tablet (25 mg total) by mouth every 6 (six) hours as needed for nausea or vomiting. 11/26/14   Junius Finner, PA-C  propranolol (INDERAL) 10 MG tablet Take 1 tablet (10 mg total) by mouth daily. Patient not taking: Reported on 11/26/2014 04/05/13   Fransisca Kaufmann, NP  propranolol (INDERAL) 20 MG tablet Take 20 mg by mouth daily.    Historical Provider, MD  ranitidine (ZANTAC) 150 MG tablet Take 150 mg by mouth 2 (two) times daily.    Historical Provider, MD  simvastatin (ZOCOR) 10 MG tablet Take 1 tablet (10 mg total) by mouth at bedtime. Patient not taking: Reported on 11/26/2014 04/05/13   Fransisca Kaufmann, NP  simvastatin (ZOCOR) 40 MG tablet Take 40 mg by mouth daily.    Historical Provider, MD  SUMAtriptan (IMITREX) 50 MG tablet Take 1 tablet (50 mg total) by mouth every 2 (two) hours as needed for migraine. 04/05/13   Fransisca Kaufmann, NP  terbinafine (LAMISIL) 1 % cream Apply 1 application topically 2 (two) times daily.    Historical Provider, MD  tiotropium (SPIRIVA) 18 MCG inhalation capsule Place 18 mcg into inhaler and inhale daily.    Historical Provider, MD  traMADol (ULTRAM) 50 MG tablet Take 1 tablet (50 mg total) by mouth at bedtime. 05/17/13   Verne Spurr, PA-C  traZODone (DESYREL) 150 MG tablet Take 150 mg by mouth at bedtime.    Historical Provider, MD   BP 170/104 mmHg  Pulse 98  Temp(Src) 98.2 F (36.8 C) (Oral)  Resp 18  Ht  (1.803 m)  Wt 224 lb (101.606 kg)  BMI 31.26 kg/m2  SpO2 97% Physical Exam  Constitutional: He is oriented to person, place, and time. He appears well-developed and well-nourished. No distress.  HENT:  Head: Normocephalic and atraumatic.  Mouth/Throat: Oropharynx is clear and moist. No oropharyngeal exudate.  Eyes: EOM are normal. Pupils are equal, round, and reactive to light.  Neck: Normal range of motion. Neck supple.  Cardiovascular: Normal rate and regular rhythm.  Exam reveals no friction rub.   No murmur heard. Pulmonary/Chest: Effort normal and breath sounds normal. No respiratory distress. He has no wheezes. He has no rales.  Abdominal: He exhibits no distension. There is tenderness (epigastric). There is no rebound.  Musculoskeletal: Normal range of motion. He exhibits no edema.   Neurological: He is alert and oriented to person, place, and time.  Skin: No rash noted. He is not diaphoretic.  Nursing note and vitals reviewed.   ED Course  Procedures (including critical care time) Labs Review Labs Reviewed - No data to display  Imaging Review Dg Chest 2 View  11/26/2014   CLINICAL DATA:  Nausea vomiting and diarrhea.  Sweats and weakness.  EXAM: CHEST  2 VIEW  COMPARISON:  08/05/2014  FINDINGS: The heart size and mediastinal contours are within normal limits. Both lungs are clear. The visualized skeletal structures are unremarkable.  IMPRESSION: No active cardiopulmonary disease.   Electronically Signed   By: Signa Kell M.D.   On: 11/26/2014 08:37   Ct Angio Chest Pe W/cm &/or Wo Cm  11/26/2014  CLINICAL DATA:  Acute intermittent chest pain with vomiting. History of pulmonary embolus. Elevated D-dimer.  EXAM: CT ANGIOGRAPHY CHEST WITH CONTRAST  TECHNIQUE: Multidetector CT imaging of the chest was performed using the standard protocol during bolus administration of intravenous contrast. Multiplanar CT image reconstructions and MIPs were obtained to evaluate the vascular anatomy.  CONTRAST:  OMNIPAQUE IOHEXOL 350 MG/ML SOLN  COMPARISON:  11/26/2014, 06/06/2009  FINDINGS: Pulmonary arteries are well visualized. No significant filling defect or pulmonary embolus demonstrated by CTA. Intact thoracic aorta. Negative for aneurysm or dissection. No mediastinal hemorrhage or hematoma. No adenopathy. Normal heart size. No pericardial or pleural effusion. Negative for hiatal hernia.  Lung windows demonstrate overall low volumes with bibasilar vascular crowding and hypoventilatory changes. Scarring or atelectasis in the inferior lingula and medial right middle lobe. Trachea and central airways are patent. Stable well-circumscribed sub cm nodules in the medial right upper lobe, images 27 and 29. Stable punctate sub cm nodule in the lingula, image 47.  Diffuse degenerative changes  of the thoracic spine. No acute osseous finding.  Included upper abdomen demonstrates scattered sub cm hypodensities throughout the liver, suspect small hepatic cysts. These are too small to definitively characterize. No biliary dilatation. No acute upper abdominal findings.  Review of the MIP images confirms the above findings.  IMPRESSION: Negative for significant acute pulmonary embolus CTA.  Low lung volumes with bibasilar hypoventilatory changes and atelectasis.  Stable right upper lobe and lingula sub cm pulmonary nodules compared to 06/06/2009 compatible with benign nodules.   Electronically Signed   By: Judie Petit.  Shick M.D.   On: 11/26/2014 10:47     EKG Interpretation None      MDM   Final diagnoses:  Generalized abdominal pain  Duodenitis  Intractable vomiting with nausea, vomiting of unspecified type    54 year old male here with nausea and vomiting. He was seen in the ED earlier today for 4 days of generalized weakness, nausea, vomiting, diarrhea and cough with shortness of breath. Labs showed hemoglobin of 19, white count of 11. Sodium is 133 bicarbonate was 17 with a BUN of 27. He was given fluids. D-dimer was elevated and he had a negative PE scan. He is had persistent nausea and vomiting since he left. Here having a lot of upper abdominal pain. We'll scan his abdomen. Repeat CBC. CT shows acute duodenitis. Feeling better after Phenergan and Dilaudid. Patient endorses blood in his vomitus, but dark vomitus noted with specks of blood, no frank blood or coffee-ground emesis. Patient admitted by Dr. Adela Glimpse. We discussed his care, will proceed with NG tube for lavage, IV Protonix.  Elwin Mocha, MD 11/26/14 [phone]

## 2014-11-26 NOTE — H&P (Signed)
PCP:  Vinnie Langton, NP    Chief Complaint:  Nausea and vomiting with some hematemesis  HPI: Jeffrey [name] is a 54 y.o. male   has a past medical history of Chronic pain; Depression; Anxiety; Suicidal overdose; GERD (gastroesophageal reflux disease); Muscle spasms (05/09/2013); COPD (chronic obstructive pulmonary disease); and Hypertension.   Presented with  Patient reports onset of nausea and vomiting, reports mild diarrhea. for the past 4 days. He have not been eating much. This AM patient felt light headed and asked to call 911. Patient resides at [facility] assisted living.  Hr reports some chest discomfort after persistent vomiting. He was evaluated in [facility] ER and given some fluids and discharged to Assisted living when he improved Initially patient had some compliant of shortness of braeth and had CTA done that showed no PE. Back at assisted living he reports his symptoms have recurred. This afternoon he noted some bright red blood in his vomit after he was retching hard. CT scan done showed duodenitis. He received some phenergan and dilaudid with some improvement. NG tube was placed showing small amount of brown gastric contents no overt bleeding noted but small a lot of coffee ground cannot be ruled out. Hemoccult negative  Of note patient has remote hx of PE.   Hospitalist was called for admission for Duodenitis and hematemesis  Review of Systems:    Pertinent positives include: chest pain, abdominal pain, nausea, vomiting, hematemesis  Constitutional:  No weight loss, night sweats, Fevers, chills, fatigue, weight loss  HEENT:  No headaches, Difficulty swallowing,Tooth/dental problems,Sore throat,  No sneezing, itching, ear ache, nasal congestion, post nasal drip,  Cardio-vascular:  No  Orthopnea, PND, anasarca, dizziness, palpitations. no Bilateral lower extremity swelling  GI:  No heartburn, indigestion,  diarrhea, change in bowel habits, loss of appetite, melena, blood  in stool,  Resp:  no shortness of breath at rest. No dyspnea on exertion, No excess mucus, no productive cough, No non-productive cough, No coughing up of blood.No change in color of mucus.No wheezing. Skin:  no rash or lesions. No jaundice GU:  no dysuria, change in color of urine, no urgency or frequency. No straining to urinate.  No flank pain.  Musculoskeletal:  No joint pain or no joint swelling. No decreased range of motion. No back pain.  Psych:  No change in mood or affect. No depression or anxiety. No memory loss.  Neuro: no localizing neurological complaints, no tingling, no weakness, no double vision, no gait abnormality, no slurred speech, no confusion  Otherwise ROS are negative except for above, 10 systems were reviewed  Past Medical History: Past Medical History  Diagnosis Date  . Chronic pain   . Depression   . Anxiety   . Suicidal overdose   . GERD (gastroesophageal reflux disease)   . Muscle spasms 05/09/2013    Right trapezoid  . COPD (chronic obstructive pulmonary disease)   . Hypertension    Past Surgical History  Procedure Laterality Date  . Ankle surgery    . Hernia repair    . Neck fusion       Medications: Prior to Admission medications   Medication Sig Start Date End Date Taking? Authorizing Provider  desvenlafaxine (PRISTIQ) 100 MG 24 hr tablet Take 1 tablet (100 mg total) by mouth daily. For depression 05/17/13  Yes Verne Spurr, PA-C  fluticasone (FLONASE) 50 MCG/ACT nasal spray Place 2 sprays into both nostrils daily.   Yes Historical Provider, MD  lidocaine (LIDODERM) 5 %  Place 1 patch onto the skin daily. Remove & Discard patch within 12 hours or as directed by MD 04/05/13  Yes Fransisca Kaufmann, NP  loratadine (CLARITIN) 10 MG tablet Take 10 mg by mouth daily.   Yes Historical Provider, MD  Multiple Vitamin (MULTIVITAMIN WITH MINERALS) TABS tablet Take 1 tablet by mouth daily. 05/17/13  Yes Verne Spurr, PA-C  pregabalin (LYRICA) 50 MG capsule Take  50 mg by mouth 3 (three) times daily.   Yes Historical Provider, MD  propranolol (INDERAL) 20 MG tablet Take 20 mg by mouth daily.   Yes Historical Provider, MD  ranitidine (ZANTAC) 150 MG tablet Take 150 mg by mouth 2 (two) times daily.   Yes Historical Provider, MD  terbinafine (LAMISIL) 1 % cream Apply 1 application topically 2 (two) times daily.   Yes Historical Provider, MD  tiotropium (SPIRIVA) 18 MCG inhalation capsule Place 18 mcg into inhaler and inhale daily.   Yes Historical Provider, MD  albuterol (PROVENTIL HFA;VENTOLIN HFA) 108 (90 BASE) MCG/ACT inhaler Inhale 2 puffs into the lungs every 6 (six) hours as needed for wheezing or shortness of breath.    Historical Provider, MD  cyclobenzaprine (FLEXERIL) 5 MG tablet Take 5 mg by mouth 3 (three) times daily.     Historical Provider, MD  dextromethorphan (DELSYM) 30 MG/5ML liquid Take 5 mLs (30 mg total) by mouth as needed for cough. Patient not taking: Reported on 11/26/2014 08/05/14   Roxy Horseman, PA-C  dicyclomine (BENTYL) 20 MG tablet Take 20 mg by mouth 4 (four) times daily -  before meals and at bedtime.    Historical Provider, MD  gabapentin (NEURONTIN) 400 MG capsule Take 800 mg by mouth 3 (three) times daily.    Historical Provider, MD  hydrOXYzine (VISTARIL) 25 MG capsule Take 25-50 mg by mouth 2 (two) times daily. 1 cap in AM and 2 caps in PM    Historical Provider, MD  loperamide (IMODIUM) 2 MG capsule Take 2 mg by mouth 4 (four) times daily as needed for diarrhea or loose stools (diarrhea).     Historical Provider, MD  promethazine (PHENERGAN) 25 MG tablet Take 1 tablet (25 mg total) by mouth every 6 (six) hours as needed for nausea or vomiting. 11/26/14   Junius Finner, PA-C  simvastatin (ZOCOR) 40 MG tablet Take 40 mg by mouth daily.    Historical Provider, MD  SUMAtriptan (IMITREX) 50 MG tablet Take 1 tablet (50 mg total) by mouth every 2 (two) hours as needed for migraine. 04/05/13   Fransisca Kaufmann, NP  traMADol (ULTRAM) 50 MG  tablet Take 1 tablet (50 mg total) by mouth at bedtime. 05/17/13   Verne Spurr, PA-C  traZODone (DESYREL) 150 MG tablet Take 150 mg by mouth at bedtime.    Historical Provider, MD    Allergies:   Allergies  Allergen Reactions  . Abilify [Aripiprazole] Swelling  . Ativan [Lorazepam]     Extreme nervousness  . Celexa [Citalopram]     Nervousness  . Effexor [Venlafaxine]     Trouble urinating  . Levaquin [Levofloxacin] Diarrhea  . Lexapro [Escitalopram Oxalate] Other (See Comments)    dizzy  . Lipitor [Atorvastatin]     Muscle weakness  . Nsaids     GI Bleeds   . Prozac [Fluoxetine]     Difficulty walking  . Toradol [Ketorolac Tromethamine] Nausea And Vomiting  . Tylenol [Acetaminophen]     GI Bleeds, tolerates ibuprofen  . Valium [Diazepam]     Nervousness  .  Wellbutrin [Bupropion] Other (See Comments)    "feels drunk"    Social History:  Ambulatory   independently   From [facility]   reports that he has quit smoking. His smoking use included Cigarettes. He has a 9.75 pack-year smoking history. He does not have any smokeless tobacco history on file. He reports that he uses illicit drugs ("Crack" cocaine). He reports that he does not drink alcohol.    Family History: family history includes CAD in his father; Heart failure in his mother.    Physical Exam: Patient Vitals for the past 24 hrs:  BP Temp Temp src Pulse Resp SpO2 Height Weight  11/26/14 2149 171/99 mmHg 98.4 F (36.9 C) Oral 82 18 99 % - -  11/26/14 2050 174/99 mmHg 98.2 F (36.8 C) Oral 82 18 99 % - -  11/26/14 1809 (!) 170/104 mmHg 98.2 F (36.8 C) Oral 98 18 97 %  (1.803 m) 101.606 kg (224 lb)    1. General:  in No Acute distress 2. Psychological: Alert and  Oriented 3. Head/ENT:    Dry Mucous Membranes                          Head Non traumatic, neck supple                          Normal  Dentition 4. SKIN:  decreased Skin turgor,  Skin clean Dry and intact no rash 5. Heart:  Regular rate and rhythm no Murmur, Rub or gallop 6. Lungs: Clear to auscultation bilaterally, no wheezes or crackles   7. Abdomen: Soft, epigastric tenderness, Non distended 8. Lower extremities: no clubbing, cyanosis, or edema 9. Neurologically Grossly intact, moving all 4 extremities equally 10. MSK: Normal range of motion  body mass index is 31.26 kg/(m^2).   Labs on Admission:   Results for orders placed or performed during the hospital encounter of 11/26/14 (from the past 24 hour(s))  CBC     Status: Abnormal   Collection Time: 11/26/14  6:24 PM  Result Value Ref Range   WBC 13.7 (H) 4.0 - 10.5 K/uL   RBC 6.06 (H) 4.22 - 5.81 MIL/uL   Hemoglobin 18.6 (H) 13.0 - 17.0 g/dL   HCT 16.1 (H) 09.6 - 04.5 %   MCV 86.8 78.0 - 100.0 fL   MCH 30.7 26.0 - 34.0 pg   MCHC 35.4 30.0 - 36.0 g/dL   RDW 40.9 81.1 - 91.4 %   Platelets 183 150 - 400 K/uL    UA on citrated but no evidence of UTI high ketones  Lab Results  Component Value Date   HGBA1C  06/06/2009    5.5 (NOTE) The ADA recommends the following therapeutic goal for glycemic control related to Hgb A1c measurement: Goal of therapy: <6.5 Hgb A1c  Reference: American Diabetes Association: Clinical Practice Recommendations 2010, Diabetes Care, 2010, 33: (Suppl  1).    Estimated Creatinine Clearance: 83 mL/min (by C-G formula based on Cr of 1.22).  BNP (last 3 results) No results for input(s): PROBNP in the last 8760 hours.  Other results:  I have pearsonaly reviewed this: ECG REPORT  Rate:77   Rhythm: NSR ST&T Change: no ischemic changes   Filed Weights   11/26/14 1809  Weight: 101.606 kg (224 lb)     Cultures:    Component Value Date/Time   SDES URINE, CLEAN CATCH 05/09/2013 0711  SPECREQUEST NONE 05/09/2013 0711   CULT NO GROWTH Performed at [facility] 05/09/2013 1610   REPTSTATUS 05/10/2013 FINAL 05/09/2013 0711     Radiological Exams on Admission: Dg Chest 2 View  11/26/2014   CLINICAL  DATA:  Nausea vomiting and diarrhea.  Sweats and weakness.  EXAM: CHEST  2 VIEW  COMPARISON:  08/05/2014  FINDINGS: The heart size and mediastinal contours are within normal limits. Both lungs are clear. The visualized skeletal structures are unremarkable.  IMPRESSION: No active cardiopulmonary disease.   Electronically Signed   By: Signa Kell M.D.   On: 11/26/2014 08:37   Ct Angio Chest Pe W/cm &/or Wo Cm  11/26/2014   CLINICAL DATA:  Acute intermittent chest pain with vomiting. History of pulmonary embolus. Elevated D-dimer.  EXAM: CT ANGIOGRAPHY CHEST WITH CONTRAST  TECHNIQUE: Multidetector CT imaging of the chest was performed using the standard protocol during bolus administration of intravenous contrast. Multiplanar CT image reconstructions and MIPs were obtained to evaluate the vascular anatomy.  CONTRAST:  OMNIPAQUE IOHEXOL 350 MG/ML SOLN  COMPARISON:  11/26/2014, 06/06/2009  FINDINGS: Pulmonary arteries are well visualized. No significant filling defect or pulmonary embolus demonstrated by CTA. Intact thoracic aorta. Negative for aneurysm or dissection. No mediastinal hemorrhage or hematoma. No adenopathy. Normal heart size. No pericardial or pleural effusion. Negative for hiatal hernia.  Lung windows demonstrate overall low volumes with bibasilar vascular crowding and hypoventilatory changes. Scarring or atelectasis in the inferior lingula and medial right middle lobe. Trachea and central airways are patent. Stable well-circumscribed sub cm nodules in the medial right upper lobe, images 27 and 29. Stable punctate sub cm nodule in the lingula, image 47.  Diffuse degenerative changes of the thoracic spine. No acute osseous finding.  Included upper abdomen demonstrates scattered sub cm hypodensities throughout the liver, suspect small hepatic cysts. These are too small to definitively characterize. No biliary dilatation. No acute upper abdominal findings.  Review of the MIP images confirms the  above findings.  IMPRESSION: Negative for significant acute pulmonary embolus CTA.  Low lung volumes with bibasilar hypoventilatory changes and atelectasis.  Stable right upper lobe and lingula sub cm pulmonary nodules compared to 06/06/2009 compatible with benign nodules.   Electronically Signed   By: Judie Petit.  Shick M.D.   On: 11/26/2014 10:47   Ct Abdomen Pelvis W Contrast  11/26/2014   CLINICAL DATA:  Initial evaluation for acute mid abdominal pain with nausea and vomiting.  EXAM: CT ABDOMEN AND PELVIS WITH CONTRAST  TECHNIQUE: Multidetector CT imaging of the abdomen and pelvis was performed using the standard protocol following bolus administration of intravenous contrast.  CONTRAST:  80mL OMNIPAQUE IOHEXOL 300 MG/ML  SOLN  COMPARISON:  Prior study from 03/29/2006.  FINDINGS: The visualized lung bases are clear. No pleural or pericardial effusion.  Motion artifact limits evaluation of the upper abdomen.  A few scattered subcentimeter hypodensities noted within the liver, too small the characterize, but may reflect small cysts versus biliary hamartomas. Liver is otherwise unremarkable. Gallbladder grossly normal, although motion artifact limits evaluation. No biliary dilatation.  Spleen, adrenal glands, and pancreas are within normal limits.  Kidneys are equal in size with symmetric enhancement. Subcentimeter hypodense lesions within the bilateral kidneys are small the characterize, but statistically likely reflect a small cysts. No nephrolithiasis, hydronephrosis, or focal enhancing renal mass.  Stomach within normal limits. Small bowel of normal caliber without evidence of obstruction. There is mild wall thickening with mucosal enhancement within the second- third portion of  the duodenum, suggesting mild acute duodenitis (series 3, image 25). Please note that motion artifact limits evaluation to this region Appendix well visualized in the right lower quadrant and is of normal caliber and appearance without  associated inflammatory changes to suggest acute appendicitis. Colonic diverticulosis without acute diverticulitis. No acute inflammatory changes seen about the bowels.  Bladder within normal limits. Prostate mildly prominent measuring 4.8 cm in transverse diameter with coarse calcifications.  No free air or fluid. No pathologically enlarged intra-abdominal pelvic lymph nodes. Normal intravascular enhancement seen throughout the abdomen and pelvis.  No acute osseous abnormality. No worrisome lytic or blastic osseous lesions. Mild multilevel degenerative endplate spurring seen throughout the visualized spine. Bilateral facet arthrosis present within the lower lumbar spine.  IMPRESSION: 1. Mild wall thickening with mucosal enhancement and inflammatory stranding about the second- third portion of the duodenum, suggesting acute duodenitis. 2. No other acute intra-abdominal pelvic process. 3. Normal appendix. 4. Colonic diverticulosis without acute diverticulitis.   Electronically Signed   By: Rise MuBenjamin  McClintock M.D.   On: 11/26/2014 21:13    Chart has been reviewed  Assessment/Plan  54 year old gentleman with history of chronic pain syndrome, hypertension presents with intractable nausea and vomiting followed now with mild hematemesis. Vital signs stable accelerated hypertension. Patient noted to be severely dehydrated.  Present on Admission:  . Hematemesis - suspect Mallory-Weiss tear even extensive retching. We'll initiate patient on Protonix recommend GI consult in the a.m. continue to monitor vital signs and CBC . Intractable nausea and vomiting - evidence of duodenitis treated with Protonix treatment nausea and vomiting symptomatically  . Chest pain - in the setting of extensive vomiting and retching. Given risk factors cycle cardiac candidiasis  . Dehydration - administer  IV fluids  . Metabolic acidosis - check lactic acid  . Accelerated hypertension - it is visibly uncomfortable. While nothing  by mouth we'll give labetalol as needed  . Chronic pain syndrome - while nothing by mouth controlled control with IV meds    Prophylaxis: SCD  Protonix  CODE STATUS:  FULL CODE   Other plan as per orders.  I have spent a total of 55 min on this admission  Akane Tessier 11/26/2014, 10:30 PM  [facility]  Pager [phone]   after 2 AM please page floor coverage PA If 7AM-7PM, please contact the day team taking care of the patient  [email or web address]  Password [email or web address]

## 2014-11-26 NOTE — ED Notes (Signed)
[facility] en route.   Attempted to call report to [facility] x 2. No answer both times.

## 2014-11-26 NOTE — ED Notes (Signed)
[facility] [address] c/o emesis and diarrhea. Pt was seen earlier today and DC'd with phenergan but unable to take due to persistent vomiting. Pt actively vomiting upon arrival and requesting phenergan. 18 G Left AV In placed by [facility].

## 2014-11-26 NOTE — ED Notes (Signed)
He reports n/v/d x 4 days.  He is awake, alert and in no distress.

## 2014-11-26 NOTE — ED Provider Notes (Signed)
CSN: [id number]     Arrival date & time 11/26/14  0800 History   First MD Initiated Contact with Patient 11/26/14 0801     Chief Complaint  Patient presents with  . Emesis     (Consider location/radiation/quality/duration/timing/severity/associated sxs/prior Treatment) HPI  Pt is a 54yo male with hx of chronic pain, depression, anxiety, suicidal overdose, GERD, COPD, and HTN, transported to ED via EMS from [facility] assisted living facility with c/o generalized weakness, 4 days of n/v/d, as well as cough and SOB.  Pt denies chest pain. Reports eating a greasy piece of beef earlier this week, prior to symptoms starting. Since then, he has not been able to keep anything down. He has had about 6 episodes of vomiting per day.  Pt states nausea and vomiting is worse than the diarrhea with 2-3 episodes a day. No blood or mucous in stool. Pt denies abdominal pain or urinary symptoms. Pt unsure of sick contacts due to living in assisted living.  Denies recent antibiotic use or recent travel. Reports hot and cold chills with night sweats but has not measured his temperature. No medications tried at home PTA.     Past Medical History  Diagnosis Date  . Chronic pain   . Depression   . Anxiety   . Suicidal overdose   . GERD (gastroesophageal reflux disease)   . Muscle spasms 05/09/2013    Right trapezoid  . COPD (chronic obstructive pulmonary disease)   . Hypertension    Past Surgical History  Procedure Laterality Date  . Ankle surgery    . Hernia repair    . Neck fusion     No family history on file. History  Substance Use Topics  . Smoking status: Current Every Day Smoker -- 0.25 packs/day for 39 years    Types: Cigarettes  . Smokeless tobacco: Not on file  . Alcohol Use: No     Comment: quit in 2003    Review of Systems  Constitutional: Positive for fever (subjective), chills, diaphoresis, appetite change and fatigue.  HENT: Positive for congestion. Negative for sore throat.    Respiratory: Positive for cough and shortness of breath.   Cardiovascular: Negative for chest pain, palpitations and leg swelling.  Gastrointestinal: Positive for nausea, vomiting and diarrhea. Negative for abdominal pain.  Genitourinary: Negative for dysuria, frequency and flank pain.  Musculoskeletal: Negative for myalgias and back pain.  Neurological: Positive for weakness ( generalized) and light-headedness. Negative for headaches.  All other systems reviewed and are negative.     Allergies  Abilify; Ativan; Celexa; Effexor; Levaquin; Lexapro; Lipitor; Nsaids; Prozac; Toradol; Tylenol; Valium; and Wellbutrin  Home Medications   Prior to Admission medications   Medication Sig Start Date End Date Taking? Authorizing Provider  albuterol (PROVENTIL HFA;VENTOLIN HFA) 108 (90 BASE) MCG/ACT inhaler Inhale 2 puffs into the lungs every 6 (six) hours as needed for wheezing or shortness of breath.   Yes Historical Provider, MD  cyclobenzaprine (FLEXERIL) 5 MG tablet Take 5 mg by mouth 3 (three) times daily.    Yes Historical Provider, MD  desvenlafaxine (PRISTIQ) 100 MG 24 hr tablet Take 1 tablet (100 mg total) by mouth daily. For depression 05/17/13  Yes Verne SpurrNeil Mashburn, PA-C  dicyclomine (BENTYL) 20 MG tablet Take 20 mg by mouth 4 (four) times daily -  before meals and at bedtime.   Yes Historical Provider, MD  fluticasone (FLONASE) 50 MCG/ACT nasal spray Place 2 sprays into both nostrils daily.   Yes Historical Provider, MD  gabapentin (NEURONTIN) 400 MG capsule Take 800 mg by mouth 3 (three) times daily.   Yes Historical Provider, MD  hydrOXYzine (VISTARIL) 25 MG capsule Take 25-50 mg by mouth 2 (two) times daily. 1 cap in AM and 2 caps in PM   Yes Historical Provider, MD  lidocaine (LIDODERM) 5 % Place 1 patch onto the skin daily. Remove & Discard patch within 12 hours or as directed by MD 04/05/13  Yes Fransisca Kaufmann, NP  loperamide (IMODIUM) 2 MG capsule Take 2 mg by mouth 4 (four) times daily as  needed for diarrhea or loose stools.   Yes Historical Provider, MD  loratadine (CLARITIN) 10 MG tablet Take 10 mg by mouth daily.   Yes Historical Provider, MD  Multiple Vitamin (MULTIVITAMIN WITH MINERALS) TABS tablet Take 1 tablet by mouth daily. 05/17/13  Yes Verne Spurr, PA-C  propranolol (INDERAL) 20 MG tablet Take 20 mg by mouth daily.   Yes Historical Provider, MD  ranitidine (ZANTAC) 150 MG tablet Take 150 mg by mouth 2 (two) times daily.   Yes Historical Provider, MD  simvastatin (ZOCOR) 40 MG tablet Take 40 mg by mouth daily.   Yes Historical Provider, MD  SUMAtriptan (IMITREX) 50 MG tablet Take 1 tablet (50 mg total) by mouth every 2 (two) hours as needed for migraine. 04/05/13  Yes Fransisca Kaufmann, NP  terbinafine (LAMISIL) 1 % cream Apply 1 application topically 2 (two) times daily.   Yes Historical Provider, MD  tiotropium (SPIRIVA) 18 MCG inhalation capsule Place 18 mcg into inhaler and inhale daily.   Yes Historical Provider, MD  traMADol (ULTRAM) 50 MG tablet Take 1 tablet (50 mg total) by mouth at bedtime. 05/17/13  Yes Verne Spurr, PA-C  traZODone (DESYREL) 150 MG tablet Take 150 mg by mouth at bedtime.   Yes Historical Provider, MD  dextromethorphan (DELSYM) 30 MG/5ML liquid Take 5 mLs (30 mg total) by mouth as needed for cough. Patient not taking: Reported on 11/26/2014 08/05/14   Roxy Horseman, PA-C  promethazine (PHENERGAN) 25 MG tablet Take 1 tablet (25 mg total) by mouth every 6 (six) hours as needed for nausea or vomiting. 11/26/14   Junius Finner, PA-C  propranolol (INDERAL) 10 MG tablet Take 1 tablet (10 mg total) by mouth daily. Patient not taking: Reported on 11/26/2014 04/05/13   Fransisca Kaufmann, NP  simvastatin (ZOCOR) 10 MG tablet Take 1 tablet (10 mg total) by mouth at bedtime. Patient not taking: Reported on 11/26/2014 04/05/13   Fransisca Kaufmann, NP   BP 161/102 mmHg  Pulse 85  Temp(Src) 97.7 F (36.5 C) (Oral)  Resp 13  SpO2 98% Physical Exam  Constitutional: He appears  well-developed and well-nourished.  Pt sitting on side of exam bed, appears fatigued. Winded getting into bed.  HENT:  Head: Normocephalic and atraumatic.  Eyes: Conjunctivae are normal. No scleral icterus.  Neck: Normal range of motion.  Cardiovascular: Normal rate, regular rhythm and normal heart sounds.   Pulmonary/Chest: Effort normal and breath sounds normal. No respiratory distress. He has no wheezes. He has no rales. He exhibits no tenderness.  SOB, winded between sentences. No accessory muscle use. Mild diffuse rhonchi, clears after pt coughed.  No wheeze. No chest wall tenderness  Abdominal: Soft. Bowel sounds are normal. He exhibits no distension and no mass. There is no tenderness. There is no rebound and no guarding.  Soft, non-distended, non-tender. No CVAT  Musculoskeletal: Normal range of motion.  Neurological: He is alert.  Skin: Skin is warm and  dry.  Nursing note and vitals reviewed.   ED Course  Procedures (including critical care time) Labs Review Labs Reviewed  CBC WITH DIFFERENTIAL/PLATELET - Abnormal; Notable for the following:    WBC 11.2 (*)    RBC 6.26 (*)    Hemoglobin 19.4 (*)    HCT 54.9 (*)    Neutro Abs 8.3 (*)    All other components within normal limits  COMPREHENSIVE METABOLIC PANEL - Abnormal; Notable for the following:    Sodium 133 (*)    CO2 17 (*)    Glucose, Bld 109 (*)    BUN 27 (*)    Total Protein 8.5 (*)    GFR calc non Af Amer 65 (*)    GFR calc Af Amer 75 (*)    All other components within normal limits  D-DIMER, QUANTITATIVE - Abnormal; Notable for the following:    D-Dimer, Quant 1.11 (*)    All other components within normal limits  LIPASE, BLOOD    Imaging Review Dg Chest 2 View  11/26/2014   CLINICAL DATA:  Nausea vomiting and diarrhea.  Sweats and weakness.  EXAM: CHEST  2 VIEW  COMPARISON:  08/05/2014  FINDINGS: The heart size and mediastinal contours are within normal limits. Both lungs are clear. The visualized  skeletal structures are unremarkable.  IMPRESSION: No active cardiopulmonary disease.   Electronically Signed   By: Signa Kell M.D.   On: 11/26/2014 08:37   Ct Angio Chest Pe W/cm &/or Wo Cm  11/26/2014   CLINICAL DATA:  Acute intermittent chest pain with vomiting. History of pulmonary embolus. Elevated D-dimer.  EXAM: CT ANGIOGRAPHY CHEST WITH CONTRAST  TECHNIQUE: Multidetector CT imaging of the chest was performed using the standard protocol during bolus administration of intravenous contrast. Multiplanar CT image reconstructions and MIPs were obtained to evaluate the vascular anatomy.  CONTRAST:  OMNIPAQUE IOHEXOL 350 MG/ML SOLN  COMPARISON:  11/26/2014, 06/06/2009  FINDINGS: Pulmonary arteries are well visualized. No significant filling defect or pulmonary embolus demonstrated by CTA. Intact thoracic aorta. Negative for aneurysm or dissection. No mediastinal hemorrhage or hematoma. No adenopathy. Normal heart size. No pericardial or pleural effusion. Negative for hiatal hernia.  Lung windows demonstrate overall low volumes with bibasilar vascular crowding and hypoventilatory changes. Scarring or atelectasis in the inferior lingula and medial right middle lobe. Trachea and central airways are patent. Stable well-circumscribed sub cm nodules in the medial right upper lobe, images 27 and 29. Stable punctate sub cm nodule in the lingula, image 47.  Diffuse degenerative changes of the thoracic spine. No acute osseous finding.  Included upper abdomen demonstrates scattered sub cm hypodensities throughout the liver, suspect small hepatic cysts. These are too small to definitively characterize. No biliary dilatation. No acute upper abdominal findings.  Review of the MIP images confirms the above findings.  IMPRESSION: Negative for significant acute pulmonary embolus CTA.  Low lung volumes with bibasilar hypoventilatory changes and atelectasis.  Stable right upper lobe and lingula sub cm pulmonary nodules  compared to 06/06/2009 compatible with benign nodules.   Electronically Signed   By: Judie Petit.  Shick M.D.   On: 11/26/2014 10:47     EKG Interpretation   Date/Time:  Sunday November 26 2014 08:04:27 EST Ventricular Rate:  77 PR Interval:  142 QRS Duration: 93 QT Interval:  394 QTC Calculation: 446 R Axis:   39 Text Interpretation:  Sinus rhythm since last tracing no significant  change Confirmed by Effie Shy  MD, ELLIOTT [phone]) on 11/26/2014 8:18:53  AM      MDM   Final diagnoses:  Nausea vomiting and diarrhea  SOB (shortness of breath)  Cough    Pt is a 54yo male with hx of COPD, presenting to ED with generalized weakness after 4 days of n/v/d, hot and cold chills with diaphoresis, SOB and cough. No recent travel or use of antibiotics. Pt appears acutely ill on exam. Afebrile, Vitals: WNL with BP of 134/95.   Pt denies CP, abdominal pain or urinary symptoms.  Benign abdominal exam. Doubt ACS. Some concern for pneumonia, will get CXR. No evidence of surgical abdomen, doubt cholelithiasis, SBO, pancreatis, mesenteric ischemia, or appendicitis. Signs and symptoms most c/w viral gastroenteritis.    EKG: unremarkable Labs: mildly elevated WBC 11.2, mild hyponatremia 133, K+: WNL at 4.4, Glucose: WNL 109, Cr: WNL at 1.22. Anion gap: WNL 10 CXR: unremarkable.  Labs: unremarkable, discussed with pt.  Pt has received 1L IV fluids. Pt still states he feels "off" and appears SOB. Reports nausea has improved.  O2-97% on RA. HR-80bpm.  Offered pt something to eat and drink, pt declined.  Discussed pt with Dr. Effie Shy, cannot r/o PE due to pt being over 50yo. Low risk for PE, however, with continued SOB, will get D-dimer.  Pt also given second 1L bag of IV NS.    9:59 AM D-dimer elevated to 1.11. Will get CT angio chest to r/o PE.  CT angio chest: negative for significant acute pulmonary embolus CTA.    No vomiting in ED.  Pt stable for discharge back to assisted living. Rx: phenergan.       Junius Finner, PA-C 11/26/14 1552  Flint Melter, MD 11/27/14 1700

## 2014-11-27 DIAGNOSIS — G894 Chronic pain syndrome: Secondary | ICD-10-CM

## 2014-11-27 DIAGNOSIS — K920 Hematemesis: Secondary | ICD-10-CM

## 2014-11-27 DIAGNOSIS — R110 Nausea: Secondary | ICD-10-CM

## 2014-11-27 LAB — TROPONIN I
TROPONIN I: 0.03 ng/mL (ref <0.031)
Troponin I: 0.03 ng/mL (ref <0.031)

## 2014-11-27 LAB — CBC
HEMATOCRIT: 50.7 % (ref 39.0–52.0)
Hemoglobin: 17.7 g/dL — ABNORMAL HIGH (ref 13.0–17.0)
MCH: 30.7 pg (ref 26.0–34.0)
MCHC: 34.9 g/dL (ref 30.0–36.0)
MCV: 87.9 fL (ref 78.0–100.0)
Platelets: 154 10*3/uL (ref 150–400)
RBC: 5.77 MIL/uL (ref 4.22–5.81)
RDW: 13.1 % (ref 11.5–15.5)
WBC: 12.5 10*3/uL — ABNORMAL HIGH (ref 4.0–10.5)

## 2014-11-27 LAB — COMPREHENSIVE METABOLIC PANEL
ALK PHOS: 69 U/L (ref 39–117)
ALT: 24 U/L (ref 0–53)
ANION GAP: 12 (ref 5–15)
AST: 25 U/L (ref 0–37)
Albumin: 4.1 g/dL (ref 3.5–5.2)
BUN: 24 mg/dL — ABNORMAL HIGH (ref 6–23)
CHLORIDE: 112 mmol/L (ref 96–112)
CO2: 17 mmol/L — AB (ref 19–32)
Calcium: 8.3 mg/dL — ABNORMAL LOW (ref 8.4–10.5)
Creatinine, Ser: 1.06 mg/dL (ref 0.50–1.35)
GFR calc Af Amer: 89 mL/min — ABNORMAL LOW (ref >90)
GFR, EST NON AFRICAN AMERICAN: 77 mL/min — AB (ref >90)
Glucose, Bld: 126 mg/dL — ABNORMAL HIGH (ref 70–99)
POTASSIUM: 4 mmol/L (ref 3.5–5.1)
Sodium: 141 mmol/L (ref 135–145)
TOTAL PROTEIN: 7.7 g/dL (ref 6.0–8.3)
Total Bilirubin: 1 mg/dL (ref 0.3–1.2)

## 2014-11-27 LAB — RAPID URINE DRUG SCREEN, HOSP PERFORMED
Amphetamines: NOT DETECTED
Barbiturates: NOT DETECTED
Benzodiazepines: NOT DETECTED
Cocaine: NOT DETECTED
OPIATES: POSITIVE — AB
Tetrahydrocannabinol: NOT DETECTED

## 2014-11-27 LAB — PHOSPHORUS: PHOSPHORUS: 3.1 mg/dL (ref 2.3–4.6)

## 2014-11-27 LAB — TSH: TSH: 0.929 u[IU]/mL (ref 0.350–4.500)

## 2014-11-27 LAB — MAGNESIUM: Magnesium: 2 mg/dL (ref 1.5–2.5)

## 2014-11-27 LAB — MRSA PCR SCREENING: MRSA BY PCR: NEGATIVE

## 2014-11-27 LAB — TYPE AND SCREEN
ABO/RH(D): O POS
Antibody Screen: NEGATIVE

## 2014-11-27 LAB — PROTIME-INR
INR: 1.13 (ref 0.00–1.49)
PROTHROMBIN TIME: 14.6 s (ref 11.6–15.2)

## 2014-11-27 LAB — LACTIC ACID, PLASMA: Lactic Acid, Venous: 1 mmol/L (ref 0.5–2.0)

## 2014-11-27 LAB — ABO/RH: ABO/RH(D): O POS

## 2014-11-27 MED ORDER — TRAZODONE HCL 150 MG PO TABS
150.0000 mg | ORAL_TABLET | Freq: Every day | ORAL | Status: DC
  Administered 2014-11-27 – 2014-11-29 (×3): 150 mg via ORAL
  Filled 2014-11-27 (×3): qty 1

## 2014-11-27 MED ORDER — SODIUM CHLORIDE 0.9 % IV SOLN
INTRAVENOUS | Status: AC
  Administered 2014-11-27: via INTRAVENOUS

## 2014-11-27 MED ORDER — ONDANSETRON HCL 4 MG PO TABS: 4.0000 mg | ORAL_TABLET | Freq: Four times a day (QID) | ORAL | Status: DC | PRN

## 2014-11-27 MED ORDER — PROMETHAZINE HCL 25 MG/ML IJ SOLN
25.0000 mg | Freq: Four times a day (QID) | INTRAMUSCULAR | Status: DC | PRN
  Administered 2014-11-27: 25 mg via INTRAVENOUS
  Filled 2014-11-27 (×2): qty 1

## 2014-11-27 MED ORDER — MORPHINE SULFATE 2 MG/ML IJ SOLN
2.0000 mg | INTRAMUSCULAR | Status: DC | PRN
  Administered 2014-11-27 (×3): 2 mg via INTRAVENOUS
  Filled 2014-11-27 (×3): qty 1

## 2014-11-27 MED ORDER — TIOTROPIUM BROMIDE MONOHYDRATE 18 MCG IN CAPS
18.0000 ug | ORAL_CAPSULE | Freq: Every day | RESPIRATORY_TRACT | Status: DC
  Administered 2014-11-27 – 2014-11-30 (×4): 18 ug via RESPIRATORY_TRACT
  Filled 2014-11-27: qty 5

## 2014-11-27 MED ORDER — METOCLOPRAMIDE HCL 5 MG/ML IJ SOLN
5.0000 mg | Freq: Four times a day (QID) | INTRAMUSCULAR | Status: DC
  Administered 2014-11-27 – 2014-11-29 (×10): 5 mg via INTRAVENOUS
  Filled 2014-11-27: qty 1
  Filled 2014-11-27: qty 2
  Filled 2014-11-27 (×3): qty 1
  Filled 2014-11-27 (×2): qty 2
  Filled 2014-11-27 (×2): qty 1
  Filled 2014-11-27 (×2): qty 2
  Filled 2014-11-27: qty 1
  Filled 2014-11-27: qty 2
  Filled 2014-11-27: qty 1

## 2014-11-27 MED ORDER — ONDANSETRON HCL 4 MG/2ML IJ SOLN
4.0000 mg | Freq: Four times a day (QID) | INTRAMUSCULAR | Status: AC
  Administered 2014-11-27 – 2014-11-29 (×8): 4 mg via INTRAVENOUS
  Filled 2014-11-27 (×8): qty 2

## 2014-11-27 MED ORDER — OXYCODONE HCL 5 MG PO TABS
5.0000 mg | ORAL_TABLET | ORAL | Status: DC | PRN
  Administered 2014-11-27 – 2014-11-30 (×8): 5 mg via ORAL
  Filled 2014-11-27 (×8): qty 1

## 2014-11-27 MED ORDER — ALBUTEROL SULFATE (2.5 MG/3ML) 0.083% IN NEBU: 2.5000 mg | INHALATION_SOLUTION | Freq: Four times a day (QID) | RESPIRATORY_TRACT | Status: DC | PRN

## 2014-11-27 MED ORDER — MORPHINE SULFATE 2 MG/ML IJ SOLN
2.0000 mg | Freq: Four times a day (QID) | INTRAMUSCULAR | Status: DC | PRN
  Administered 2014-11-28 – 2014-11-29 (×2): 2 mg via INTRAVENOUS
  Filled 2014-11-27 (×2): qty 1

## 2014-11-27 MED ORDER — ONDANSETRON HCL 4 MG/2ML IJ SOLN
4.0000 mg | Freq: Four times a day (QID) | INTRAMUSCULAR | Status: DC | PRN
  Administered 2014-11-27: 4 mg via INTRAVENOUS
  Filled 2014-11-27: qty 2

## 2014-11-27 MED ORDER — LABETALOL HCL 5 MG/ML IV SOLN
10.0000 mg | INTRAVENOUS | Status: DC | PRN
  Administered 2014-11-27 (×2): 10 mg via INTRAVENOUS
  Filled 2014-11-27 (×3): qty 4

## 2014-11-27 MED ORDER — ACETAMINOPHEN 650 MG RE SUPP: 650.0000 mg | Freq: Four times a day (QID) | RECTAL | Status: DC | PRN

## 2014-11-27 MED ORDER — ALBUTEROL SULFATE HFA 108 (90 BASE) MCG/ACT IN AERS: 2.0000 | INHALATION_SPRAY | Freq: Four times a day (QID) | RESPIRATORY_TRACT | Status: DC | PRN

## 2014-11-27 MED ORDER — PREGABALIN 50 MG PO CAPS
50.0000 mg | ORAL_CAPSULE | Freq: Three times a day (TID) | ORAL | Status: DC
  Administered 2014-11-27 – 2014-11-30 (×9): 50 mg via ORAL
  Filled 2014-11-27 (×9): qty 1

## 2014-11-27 MED ORDER — DESVENLAFAXINE SUCCINATE ER 50 MG PO TB24
100.0000 mg | ORAL_TABLET | Freq: Every day | ORAL | Status: DC
Start: 2014-11-27 — End: 2014-11-30
  Administered 2014-11-27 – 2014-11-30 (×4): 100 mg via ORAL
  Filled 2014-11-27 (×4): qty 2

## 2014-11-27 MED ORDER — ACETAMINOPHEN 325 MG PO TABS: 650.0000 mg | ORAL_TABLET | Freq: Four times a day (QID) | ORAL | Status: DC | PRN

## 2014-11-27 MED ORDER — GABAPENTIN 400 MG PO CAPS
800.0000 mg | ORAL_CAPSULE | Freq: Three times a day (TID) | ORAL | Status: DC
  Administered 2014-11-27 – 2014-11-30 (×9): 800 mg via ORAL
  Filled 2014-11-27 (×11): qty 2

## 2014-11-27 MED ORDER — PROPRANOLOL HCL 20 MG PO TABS
20.0000 mg | ORAL_TABLET | Freq: Every day | ORAL | Status: DC
  Administered 2014-11-27 – 2014-11-30 (×4): 20 mg via ORAL
  Filled 2014-11-27 (×4): qty 1

## 2014-11-27 MED ORDER — SODIUM CHLORIDE 0.9 % IJ SOLN
3.0000 mL | Freq: Two times a day (BID) | INTRAMUSCULAR | Status: DC
  Administered 2014-11-28 – 2014-11-29 (×3): 3 mL via INTRAVENOUS

## 2014-11-27 MED ORDER — SODIUM CHLORIDE 0.9 % IV SOLN
INTRAVENOUS | Status: DC
  Administered 2014-11-27 (×2): via INTRAVENOUS

## 2014-11-27 MED ORDER — PANTOPRAZOLE SODIUM 40 MG IV SOLR
40.0000 mg | Freq: Two times a day (BID) | INTRAVENOUS | Status: DC
Start: 2014-11-27 — End: 2014-11-29
  Administered 2014-11-27 – 2014-11-28 (×5): 40 mg via INTRAVENOUS
  Filled 2014-11-27 (×6): qty 40

## 2014-11-27 NOTE — Care Management Note (Addendum)
    Page 1 of 1   11/30/2014     11:48:04 AM CARE MANAGEMENT NOTE 11/30/2014  Patient:  Jeffrey [name],Jeffrey [name]   Account Number:  [id number]  Date Initiated:  11/27/2014  Documentation initiated by:  Lanier ClamMAHABIR,Croy Drumwright  Subjective/Objective Assessment:   54 y/o m admitted [name]/Hematemesis.     Action/Plan:   From [facility].   Anticipated DC Date:  11/30/2014   Anticipated DC Plan:  ASSISTED LIVING / REST HOME      DC Planning Services  CM consult      Choice offered to / List presented to:             Status of service:  Completed, signed off Medicare Important Message given?   (If response is "NO", the following Medicare IM given date fields will be blank) Date Medicare IM given:   Medicare IM given by:   Date Additional Medicare IM given:   Additional Medicare IM given by:    Discharge Disposition:  ASSISTED LIVING  Per UR Regulation:  Reviewed for med. necessity/level of care/duration of stay  If discussed at Long Length of Stay Meetings, dates discussed:    Comments:  11/30/14 Lanier ClamKathy Jeffrey Bonnell RN BSN NCM [phone] d/c back to [facility]. No d/c needs or orders.  11/27/14 Lanier ClamKathy Allana Shrestha RN BSN NCM [phone] GI following-NGT-clamping, EGD in am.Recommend PT cons when appropriate.

## 2014-11-27 NOTE — Progress Notes (Signed)
Received from SDU alert oriented, NGT clamped, no complaints of any pain or discomfort.

## 2014-11-27 NOTE — Progress Notes (Addendum)
[facility] PROGRESS NOTE  GREGOREY NABOR ZOX:[id number] DOB: 03-12-1961 DOA: 11/26/2014 PCP: Vinnie Langton, NP  Assessment/Plan:  Hematemesis  - May be due to Mallory-Weiss tear even extensive retching. - Continue IV Protonix BID. - Requested consultation with [facility], patient indicates that he has seen Dr. Madilyn Fireman in the patient. - Continue to monitor CBC, hemoglobin stable.   - Currently on clear liquid diet. - Stool occult pending.  Intractable nausea and vomiting  - Evidence of duodenitis on CT, treated with Protonix treatment nausea and vomiting symptomatically. - Continue NG with  intermittent suction.    Uncontrolled hypertension  - Blood pressure improved from yesterday.  Currently on when necessary labetalol every 2 hours as needed.  As patient is currently nothing by mouth, once able to tolerate oral intake and depending on patient's clinical course may consider starting patient on antihypertensive medications.   Chest pain  - In the setting of extensive vomiting and retching. - cardiac enzymes are being cycled, troponins are negative.  Dehydration  - Due to GI losses. - Continue IV fluids.  Metabolic acidosis  - Lactic acid normal, may be due to GI losses/dehydration.  Chronic pain syndrome  - Control with IV meds as needed.  Depression - Resume home psych medications.  Code Status: Full code Family Communication: No family at bedside, patient updated. Disposition Plan: Pending, continue in stepdown.  Consultants:  GI  Procedures:  CT of abdomen and pelvis on 11/26/2014.  Antibiotics:  None.  HPI/Subjective:  Patient feeling slightly better today than yesterday. NG in place, with intermittent suction.  Objective: Filed Vitals:   11/27/14 0400 11/27/14 0500 11/27/14 0600 11/27/14 0800  BP: 170/111 176/110 155/98 150/93  Pulse: 98 99 89 94  Temp: 97.9 F (36.6 C)     TempSrc: Oral     Resp: Height:      Weight:       SpO2: 93% 92% 92% 91%    Intake/Output Summary (Last 24 hours) at 11/27/14 0810 Last data filed at 11/27/14 0800  Gross per 24 hour  Intake 723.33 ml  Output    950 ml  Net -226.67 ml   Filed Weights   11/26/14 1809 11/27/14 0015  Weight: 101.606 kg (224 lb) 98.5 kg (217 lb 2.5 oz)    Exam:  Physical Exam: General: Awake, Oriented, No acute distress, NG in place. HEENT: EOMI. Neck: Supple CV: S1 and S2 Lungs: Clear to ascultation bilaterally Abdomen: Soft, Nontender, Nondistended, +bowel sounds. Ext: Good pulses. Trace edema.  Data Reviewed: Basic Metabolic Panel:  Recent Labs Lab 11/26/14 0809 11/27/14 0108  NA 133* 141  K 4.4 4.0  CL 106 112  CO2 17* 17*  GLUCOSE 109* 126*  BUN 27* 24*  CREATININE 1.22 1.06  CALCIUM 9.0 8.3*  MG  --  2.0  PHOS  --  3.1   Liver Function Tests:  Recent Labs Lab 11/26/14 0809 11/27/14 0108  AST 25 25  ALT 26 24  ALKPHOS 76 69  BILITOT 1.2 1.0  PROT 8.5* 7.7  ALBUMIN 4.6 4.1    Recent Labs Lab 11/26/14 0809  LIPASE 23   No results for input(s): AMMONIA in the last 168 hours. CBC:  Recent Labs Lab 11/26/14 0809 11/26/14 1824 11/27/14 0108  WBC 11.2* 13.7* 12.5*  NEUTROABS 8.3*  --   --   HGB 19.4* 18.6* 17.7*  HCT 54.9* 52.6* 50.7  MCV 87.7 86.8 87.9  PLT 163 183 154  Cardiac Enzymes:  Recent Labs Lab 11/27/14 0027 11/27/14 0628  TROPONINI <0.03 0.03   BNP (last 3 results) No results for input(s): BNP in the last 8760 hours.  ProBNP (last 3 results) No results for input(s): PROBNP in the last 8760 hours.  CBG: No results for input(s): GLUCAP in the last 168 hours.  Recent Results (from the past 240 hour(s))  MRSA PCR Screening     Status: None   Collection Time: 11/27/14 12:22 AM  Result Value Ref Range Status   MRSA by PCR NEGATIVE NEGATIVE Final    Comment:        The GeneXpert MRSA Assay (FDA approved for NASAL specimens only), is one component of a comprehensive MRSA  colonization surveillance program. It is not intended to diagnose MRSA infection nor to guide or monitor treatment for MRSA infections.      Studies: Dg Chest 2 View  11/26/2014   CLINICAL DATA:  Nausea vomiting and diarrhea.  Sweats and weakness.  EXAM: CHEST  2 VIEW  COMPARISON:  08/05/2014  FINDINGS: The heart size and mediastinal contours are within normal limits. Both lungs are clear. The visualized skeletal structures are unremarkable.  IMPRESSION: No active cardiopulmonary disease.   Electronically Signed   By: Signa Kell M.D.   On: 11/26/2014 08:37   Ct Angio Chest Pe W/cm &/or Wo Cm  11/26/2014   CLINICAL DATA:  Acute intermittent chest pain with vomiting. History of pulmonary embolus. Elevated D-dimer.  EXAM: CT ANGIOGRAPHY CHEST WITH CONTRAST  TECHNIQUE: Multidetector CT imaging of the chest was performed using the standard protocol during bolus administration of intravenous contrast. Multiplanar CT image reconstructions and MIPs were obtained to evaluate the vascular anatomy.  CONTRAST:  OMNIPAQUE IOHEXOL 350 MG/ML SOLN  COMPARISON:  11/26/2014, 06/06/2009  FINDINGS: Pulmonary arteries are well visualized. No significant filling defect or pulmonary embolus demonstrated by CTA. Intact thoracic aorta. Negative for aneurysm or dissection. No mediastinal hemorrhage or hematoma. No adenopathy. Normal heart size. No pericardial or pleural effusion. Negative for hiatal hernia.  Lung windows demonstrate overall low volumes with bibasilar vascular crowding and hypoventilatory changes. Scarring or atelectasis in the inferior lingula and medial right middle lobe. Trachea and central airways are patent. Stable well-circumscribed sub cm nodules in the medial right upper lobe, images 27 and 29. Stable punctate sub cm nodule in the lingula, image 47.  Diffuse degenerative changes of the thoracic spine. No acute osseous finding.  Included upper abdomen demonstrates scattered sub cm hypodensities  throughout the liver, suspect small hepatic cysts. These are too small to definitively characterize. No biliary dilatation. No acute upper abdominal findings.  Review of the MIP images confirms the above findings.  IMPRESSION: Negative for significant acute pulmonary embolus CTA.  Low lung volumes with bibasilar hypoventilatory changes and atelectasis.  Stable right upper lobe and lingula sub cm pulmonary nodules compared to 06/06/2009 compatible with benign nodules.   Electronically Signed   By: Judie Petit.  Shick M.D.   On: 11/26/2014 10:47   Ct Abdomen Pelvis W Contrast  11/26/2014   CLINICAL DATA:  Initial evaluation for acute mid abdominal pain with nausea and vomiting.  EXAM: CT ABDOMEN AND PELVIS WITH CONTRAST  TECHNIQUE: Multidetector CT imaging of the abdomen and pelvis was performed using the standard protocol following bolus administration of intravenous contrast.  CONTRAST:  80mL OMNIPAQUE IOHEXOL 300 MG/ML  SOLN  COMPARISON:  Prior study from 03/29/2006.  FINDINGS: The visualized lung bases are clear. No pleural or pericardial effusion.  Motion artifact limits evaluation of the upper abdomen.  A few scattered subcentimeter hypodensities noted within the liver, too small the characterize, but may reflect small cysts versus biliary hamartomas. Liver is otherwise unremarkable. Gallbladder grossly normal, although motion artifact limits evaluation. No biliary dilatation.  Spleen, adrenal glands, and pancreas are within normal limits.  Kidneys are equal in size with symmetric enhancement. Subcentimeter hypodense lesions within the bilateral kidneys are small the characterize, but statistically likely reflect a small cysts. No nephrolithiasis, hydronephrosis, or focal enhancing renal mass.  Stomach within normal limits. Small bowel of normal caliber without evidence of obstruction. There is mild wall thickening with mucosal enhancement within the second- third portion of the duodenum, suggesting mild acute  duodenitis (series 3, image 25). Please note that motion artifact limits evaluation to this region Appendix well visualized in the right lower quadrant and is of normal caliber and appearance without associated inflammatory changes to suggest acute appendicitis. Colonic diverticulosis without acute diverticulitis. No acute inflammatory changes seen about the bowels.  Bladder within normal limits. Prostate mildly prominent measuring 4.8 cm in transverse diameter with coarse calcifications.  No free air or fluid. No pathologically enlarged intra-abdominal pelvic lymph nodes. Normal intravascular enhancement seen throughout the abdomen and pelvis.  No acute osseous abnormality. No worrisome lytic or blastic osseous lesions. Mild multilevel degenerative endplate spurring seen throughout the visualized spine. Bilateral facet arthrosis present within the lower lumbar spine.  IMPRESSION: 1. Mild wall thickening with mucosal enhancement and inflammatory stranding about the second- third portion of the duodenum, suggesting acute duodenitis. 2. No other acute intra-abdominal pelvic process. 3. Normal appendix. 4. Colonic diverticulosis without acute diverticulitis.   Electronically Signed   By: Rise MuBenjamin  McClintock M.D.   On: 11/26/2014 21:13    Scheduled Meds: . gabapentin  800 mg Oral TID  . metoCLOPramide (REGLAN) injection  5 mg Intravenous 4 times per day  . pantoprazole (PROTONIX) IV  40 mg Intravenous Q12H  . pregabalin  50 mg Oral TID  . propranolol  20 mg Oral Daily  . sodium chloride  3 mL Intravenous Q12H  . tiotropium  18 mcg Inhalation Daily  . traZODone  150 mg Oral QHS   Continuous Infusions: . sodium chloride 100 mL/hr at 11/27/14 0600    Active Problems:   Chronic pain syndrome   Hematemesis   Intractable nausea and vomiting   Chest pain   Dehydration   Metabolic acidosis   Accelerated hypertension    Juanda Luba A, MD  [facility] Pager [phone]. If 7PM-7AM, please  contact night-coverage at [email or web address], password [email or web address] 11/27/2014, 8:10 AM  LOS: 1 day

## 2014-11-27 NOTE — Progress Notes (Signed)
Clinical Social Work Department BRIEF PSYCHOSOCIAL ASSESSMENT 11/27/2014  Patient:  Jeffrey [name], Jeffrey [name]     Account Number:  [id number]     Admit date:  11/26/2014  Clinical Social Worker:  Maryln Manuel  Date/Time:  11/27/2014 03:00 PM  Referred by:  Physician  Date Referred:  11/27/2014 Referred for  ALF Placement   Other Referral:   Interview type:  Patient Other interview type:    PSYCHOSOCIAL DATA Living Status:  FACILITY Admitted from facility:  [facility] Level of care:  Assisted Living Primary support name:  [name] Primary support relationship to patient:  FRIEND Degree of support available:   adequate    CURRENT CONCERNS Current Concerns  Post-Acute Placement   Other Concerns:    SOCIAL WORK ASSESSMENT / PLAN CSW received referral that pt admitted from [facility].    CSW met with pt at bedside. CSW introduced self and explained role. Pt confirmed that he is a resident at [name]. Pt shared that he has been a resident at facility since last May. CSW inquired with pt regarding his plans when he leaves the hospital and pt states that he plans to return to [name] upon discharge. Pt discussed that he would like to be provided a ALF list in order to begin to explore other options once he returns to [name] Pt states that he is satisfied with the care at [name], but does not like the location and that is why he wants to look into other options. CSW expressed understanding and discussed that ALF list can be provided to pt prior to discharge. Pt appreciative.    CSW completed FL2 and sent pt clinicals to [name].    CSW to continue to follow to assist with pt return to [name] when pt medically ready for discharge.   Assessment/plan status:  Psychosocial Support/Ongoing Assessment of Needs Other assessment/ plan:   discharge planning   Information/referral to community resources:   Referral back to  [name]    PATIENT'S/FAMILY'S RESPONSE TO PLAN OF CARE: Pt alert and oriented x 4. Pt appropriately engaged in conversation. Pt shared that he is pleased with [name] at this time and plans to return when medically stable, but would appreciate a list of ALF facilities in order to begin exploring other potential options for ALF once he returns to [name]. Pt expressed that he is not in a rush to find another ALF, but is interested in calling other facilities to explore options.  Pt appreciative of CSW visit and assistance.   [name], MSW, [place] [facility] [phone]

## 2014-11-27 NOTE — Consult Note (Signed)
[facility] Gastroenterology Consultation Note  Referring Provider: Dr. Andreas Blower [facility]) Primary Care Physician:  Vinnie Langton, NP Primary Gastroenterologist:  Dr. Dorena Cookey  Reason for Consultation:  Hematemesis  HPI: Jeffrey [name] is a 54 y.o. male admitted for hematemesis.  For the past several days, patient has had progressive generalized abdominal pain and nausea and vomiting.  Ultimately developed some bloody emesis.  Symptoms persistent and thus he sought admission to ED.  No melena or hematochezia.  Is having some chest discomfort as well after multiple episodes of emesis.  Chest CT negative for pulmonary embolism.  CT abdomen showed duodenitis.  No NSAIDs.  Last endoscopy 2007 by Dr. Madilyn Fireman showed antritis.   Past Medical History  Diagnosis Date  . Chronic pain   . Depression   . Anxiety   . Suicidal overdose   . GERD (gastroesophageal reflux disease)   . Muscle spasms 05/09/2013    Right trapezoid  . COPD (chronic obstructive pulmonary disease)   . Hypertension     Past Surgical History  Procedure Laterality Date  . Ankle surgery    . Hernia repair    . Neck fusion      Prior to Admission medications   Medication Sig Start Date End Date Taking? Authorizing Provider  desvenlafaxine (PRISTIQ) 100 MG 24 hr tablet Take 1 tablet (100 mg total) by mouth daily. For depression 05/17/13  Yes Verne Spurr, PA-C  fluticasone (FLONASE) 50 MCG/ACT nasal spray Place 2 sprays into both nostrils daily.   Yes Historical Provider, MD  lidocaine (LIDODERM) 5 % Place 1 patch onto the skin daily. Remove & Discard patch within 12 hours or as directed by MD 04/05/13  Yes Fransisca Kaufmann, NP  loratadine (CLARITIN) 10 MG tablet Take 10 mg by mouth daily.   Yes Historical Provider, MD  Multiple Vitamin (MULTIVITAMIN WITH MINERALS) TABS tablet Take 1 tablet by mouth daily. 05/17/13  Yes Verne Spurr, PA-C  pregabalin (LYRICA) 50 MG capsule Take 50 mg by mouth 3 (three) times daily.   Yes Historical  Provider, MD  propranolol (INDERAL) 20 MG tablet Take 20 mg by mouth daily.   Yes Historical Provider, MD  ranitidine (ZANTAC) 150 MG tablet Take 150 mg by mouth 2 (two) times daily.   Yes Historical Provider, MD  terbinafine (LAMISIL) 1 % cream Apply 1 application topically 2 (two) times daily.   Yes Historical Provider, MD  tiotropium (SPIRIVA) 18 MCG inhalation capsule Place 18 mcg into inhaler and inhale daily.   Yes Historical Provider, MD  albuterol (PROVENTIL HFA;VENTOLIN HFA) 108 (90 BASE) MCG/ACT inhaler Inhale 2 puffs into the lungs every 6 (six) hours as needed for wheezing or shortness of breath.    Historical Provider, MD  cyclobenzaprine (FLEXERIL) 5 MG tablet Take 5 mg by mouth 3 (three) times daily.     Historical Provider, MD  dextromethorphan (DELSYM) 30 MG/5ML liquid Take 5 mLs (30 mg total) by mouth as needed for cough. Patient not taking: Reported on 11/26/2014 08/05/14   Roxy Horseman, PA-C  dicyclomine (BENTYL) 20 MG tablet Take 20 mg by mouth 4 (four) times daily -  before meals and at bedtime.    Historical Provider, MD  gabapentin (NEURONTIN) 400 MG capsule Take 800 mg by mouth 3 (three) times daily.    Historical Provider, MD  hydrOXYzine (VISTARIL) 25 MG capsule Take 25-50 mg by mouth 2 (two) times daily. 1 cap in AM and 2 caps in PM    Historical Provider, MD  loperamide (IMODIUM) 2 MG capsule Take 2 mg by mouth 4 (four) times daily as needed for diarrhea or loose stools (diarrhea).     Historical Provider, MD  promethazine (PHENERGAN) 25 MG tablet Take 1 tablet (25 mg total) by mouth every 6 (six) hours as needed for nausea or vomiting. 11/26/14   Junius Finner, PA-C  simvastatin (ZOCOR) 40 MG tablet Take 40 mg by mouth daily.    Historical Provider, MD  SUMAtriptan (IMITREX) 50 MG tablet Take 1 tablet (50 mg total) by mouth every 2 (two) hours as needed for migraine. 04/05/13   Fransisca Kaufmann, NP  traMADol (ULTRAM) 50 MG tablet Take 1 tablet (50 mg total) by mouth at  bedtime. 05/17/13   Verne Spurr, PA-C  traZODone (DESYREL) 150 MG tablet Take 150 mg by mouth at bedtime.    Historical Provider, MD    Current Facility-Administered Medications  Medication Dose Route Frequency Provider Last Rate Last Dose  . 0.9 %  sodium chloride infusion   Intravenous Continuous Therisa Doyne, MD 100 mL/hr at 11/27/14 0600    . albuterol (PROVENTIL) (2.5 MG/3ML) 0.083% nebulizer solution 2.5 mg  2.5 mg Nebulization Q6H PRN Therisa Doyne, MD      . gabapentin (NEURONTIN) capsule 800 mg  800 mg Oral TID Therisa Doyne, MD      . labetalol (NORMODYNE,TRANDATE) injection 10 mg  10 mg Intravenous Q2H PRN Therisa Doyne, MD   10 mg at 11/27/14 0511  . metoCLOPramide (REGLAN) injection 5 mg  5 mg Intravenous 4 times per day Therisa Doyne, MD   5 mg at 11/27/14 0510  . morphine 2 MG/ML injection 2 mg  2 mg Intravenous Q4H PRN Therisa Doyne, MD   2 mg at 11/27/14 0806  . ondansetron (ZOFRAN) tablet 4 mg  4 mg Oral Q6H PRN Therisa Doyne, MD       Or  . ondansetron (ZOFRAN) injection 4 mg  4 mg Intravenous Q6H PRN Therisa Doyne, MD   4 mg at 11/27/14 0510  . pantoprazole (PROTONIX) injection 40 mg  40 mg Intravenous Q12H Therisa Doyne, MD   40 mg at 11/27/14 0045  . pregabalin (LYRICA) capsule 50 mg  50 mg Oral TID Therisa Doyne, MD      . promethazine (PHENERGAN) injection 25 mg  25 mg Intravenous Q6H PRN Therisa Doyne, MD   25 mg at 11/27/14 0045  . propranolol (INDERAL) tablet 20 mg  20 mg Oral Daily Therisa Doyne, MD      . sodium chloride 0.9 % injection 3 mL  3 mL Intravenous Q12H Therisa Doyne, MD   3 mL at 11/27/14 0030  . tiotropium (SPIRIVA) inhalation capsule 18 mcg  18 mcg Inhalation Daily Therisa Doyne, MD      . traZODone (DESYREL) tablet 150 mg  150 mg Oral QHS Therisa Doyne, MD        Allergies as of 11/26/2014 - Review Complete 11/26/2014  Allergen Reaction Noted  . Abilify [aripiprazole]  Swelling 03/29/2013  . Ativan [lorazepam]  03/29/2013  . Celexa [citalopram]  03/29/2013  . Effexor [venlafaxine]  03/29/2013  . Levaquin [levofloxacin] Diarrhea 03/29/2013  . Lexapro [escitalopram oxalate] Other (See Comments) 04/08/2013  . Lipitor [atorvastatin]  03/29/2013  . Nsaids  05/09/2013  . Prozac [fluoxetine]  03/29/2013  . Toradol [ketorolac tromethamine] Nausea And Vomiting 03/29/2013  . Tylenol [acetaminophen]  03/29/2013  . Valium [diazepam]  03/29/2013  . Wellbutrin [bupropion] Other (See Comments) 05/09/2013    Family History  Problem  Relation Age of Onset  . Heart failure Mother   . CAD Father     History   Social History  . Marital Status: Single    Spouse Name: N/A  . Number of Children: N/A  . Years of Education: N/A   Occupational History  . Not on file.   Social History Main Topics  . Smoking status: Former Smoker -- 0.25 packs/day for 39 years    Types: Cigarettes  . Smokeless tobacco: Not on file  . Alcohol Use: No     Comment: quit in 2003  . Drug Use: Yes    Special: "Crack" cocaine     Comment: not since 2014  . Sexual Activity: Not on file   Other Topics Concern  . Not on file   Social History Narrative    Review of Systems: ROS Dr. Adela Glimpse 11/26/14 reviewed and I agree  Physical Exam: Vital signs in last 24 hours: Temp:  [97.7 F (36.5 C)-98.4 F (36.9 C)] 98 F (36.7 C) (02/22 0800) Pulse Rate:  [80-115] 94 (02/22 0800) Resp:  [13-21] 13 (02/22 0800) BP: (145-189)/(92-121) 150/93 mmHg (02/22 0800) SpO2:  [91 %-100 %] 91 % (02/22 0800) Weight:  [98.5 kg (217 lb 2.5 oz)-101.606 kg (224 lb)] 98.5 kg (217 lb 2.5 oz) (02/22 0015) Last BM Date: 11/26/14 General:   Alert,  Well-developed, well-nourished, pleasant and cooperative in NAD Head:  Normocephalic and atraumatic. Eyes:  Sclera clear, no icterus.   Conjunctiva pink. Ears:  Normal auditory acuity. Nose:  No deformity, discharge,  or lesions.  NGT in place with bilious  drainage; no bloody drainage Mouth:  No deformity or lesions.  Oropharynx pink & moist. Neck:  Supple; no masses or thyromegaly. Lungs:  Clear throughout to auscultation.   No wheezes, crackles, or rhonchi. No acute distress. Heart:  Regular rate and rhythm; no murmurs, clicks, rubs,  or gallops. Abdomen:  Soft, mild generalized tenderness, nondistended. No masses, hepatosplenomegaly or hernias noted. Normal bowel sounds, without guarding, and without rebound.    No peritonitis Msk:  Symmetrical without gross deformities. Normal posture. Pulses:  Normal pulses noted. Extremities:  Without clubbing or edema. Neurologic:  Alert and  oriented x4;  grossly normal neurologically. Skin:  Intact without significant lesions or rashes. Psych:  Alert and cooperative. Normal mood and affect.   Lab Results:  Recent Labs  11/26/14 0809 11/26/14 1824 11/27/14 0108  WBC 11.2* 13.7* 12.5*  HGB 19.4* 18.6* 17.7*  HCT 54.9* 52.6* 50.7  PLT 163 183 154   BMET  Recent Labs  11/26/14 0809 11/27/14 0108  NA 133* 141  K 4.4 4.0  CL 106 112  CO2 17* 17*  GLUCOSE 109* 126*  BUN 27* 24*  CREATININE 1.22 1.06  CALCIUM 9.0 8.3*   LFT  Recent Labs  11/27/14 0108  PROT 7.7  ALBUMIN 4.1  AST 25  ALT 24  ALKPHOS 69  BILITOT 1.0   PT/INR  Recent Labs  11/27/14 0100  LABPROT 14.6  INR 1.13    Studies/Results: Dg Chest 2 View  11/26/2014   CLINICAL DATA:  Nausea vomiting and diarrhea.  Sweats and weakness.  EXAM: CHEST  2 VIEW  COMPARISON:  08/05/2014  FINDINGS: The heart size and mediastinal contours are within normal limits. Both lungs are clear. The visualized skeletal structures are unremarkable.  IMPRESSION: No active cardiopulmonary disease.   Electronically Signed   By: Signa Kell M.D.   On: 11/26/2014 08:37   Ct Angio Chest  Pe W/cm &/or Wo Cm  11/26/2014   CLINICAL DATA:  Acute intermittent chest pain with vomiting. History of pulmonary embolus. Elevated D-dimer.  EXAM: CT  ANGIOGRAPHY CHEST WITH CONTRAST  TECHNIQUE: Multidetector CT imaging of the chest was performed using the standard protocol during bolus administration of intravenous contrast. Multiplanar CT image reconstructions and MIPs were obtained to evaluate the vascular anatomy.  CONTRAST:  100mL OMNIPAQUE IOHEXOL 350 MG/ML SOLN  COMPARISON:  11/26/2014, 06/06/2009  FINDINGS: Pulmonary arteries are well visualized. No significant filling defect or pulmonary embolus demonstrated by CTA. Intact thoracic aorta. Negative for aneurysm or dissection. No mediastinal hemorrhage or hematoma. No adenopathy. Normal heart size. No pericardial or pleural effusion. Negative for hiatal hernia.  Lung windows demonstrate overall low volumes with bibasilar vascular crowding and hypoventilatory changes. Scarring or atelectasis in the inferior lingula and medial right middle lobe. Trachea and central airways are patent. Stable well-circumscribed sub cm nodules in the medial right upper lobe, images 27 and 29. Stable punctate sub cm nodule in the lingula, image 47.  Diffuse degenerative changes of the thoracic spine. No acute osseous finding.  Included upper abdomen demonstrates scattered sub cm hypodensities throughout the liver, suspect small hepatic cysts. These are too small to definitively characterize. No biliary dilatation. No acute upper abdominal findings.  Review of the MIP images confirms the above findings.  IMPRESSION: Negative for significant acute pulmonary embolus CTA.  Low lung volumes with bibasilar hypoventilatory changes and atelectasis.  Stable right upper lobe and lingula sub cm pulmonary nodules compared to 06/06/2009 compatible with benign nodules.   Electronically Signed   By: Judie PetitM.  Shick M.D.   On: 11/26/2014 10:47   Ct Abdomen Pelvis W Contrast  11/26/2014   CLINICAL DATA:  Initial evaluation for acute mid abdominal pain with nausea and vomiting.  EXAM: CT ABDOMEN AND PELVIS WITH CONTRAST  TECHNIQUE: Multidetector CT  imaging of the abdomen and pelvis was performed using the standard protocol following bolus administration of intravenous contrast.  CONTRAST:  80mL OMNIPAQUE IOHEXOL 300 MG/ML  SOLN  COMPARISON:  Prior study from 03/29/2006.  FINDINGS: The visualized lung bases are clear. No pleural or pericardial effusion.  Motion artifact limits evaluation of the upper abdomen.  A few scattered subcentimeter hypodensities noted within the liver, too small the characterize, but may reflect small cysts versus biliary hamartomas. Liver is otherwise unremarkable. Gallbladder grossly normal, although motion artifact limits evaluation. No biliary dilatation.  Spleen, adrenal glands, and pancreas are within normal limits.  Kidneys are equal in size with symmetric enhancement. Subcentimeter hypodense lesions within the bilateral kidneys are small the characterize, but statistically likely reflect a small cysts. No nephrolithiasis, hydronephrosis, or focal enhancing renal mass.  Stomach within normal limits. Small bowel of normal caliber without evidence of obstruction. There is mild wall thickening with mucosal enhancement within the second- third portion of the duodenum, suggesting mild acute duodenitis (series 3, image 25). Please note that motion artifact limits evaluation to this region Appendix well visualized in the right lower quadrant and is of normal caliber and appearance without associated inflammatory changes to suggest acute appendicitis. Colonic diverticulosis without acute diverticulitis. No acute inflammatory changes seen about the bowels.  Bladder within normal limits. Prostate mildly prominent measuring 4.8 cm in transverse diameter with coarse calcifications.  No free air or fluid. No pathologically enlarged intra-abdominal pelvic lymph nodes. Normal intravascular enhancement seen throughout the abdomen and pelvis.  No acute osseous abnormality. No worrisome lytic or blastic osseous lesions. Mild multilevel  degenerative endplate spurring seen throughout the visualized spine. Bilateral facet arthrosis present within the lower lumbar spine.  IMPRESSION: 1. Mild wall thickening with mucosal enhancement and inflammatory stranding about the second- third portion of the duodenum, suggesting acute duodenitis. 2. No other acute intra-abdominal pelvic process. 3. Normal appendix. 4. Colonic diverticulosis without acute diverticulitis.   Electronically Signed   By: Rise Mu M.D.   On: 11/26/2014 21:13   Impression:  1.  Nausea and vomiting.   2.  Hematemesis.  Suspect esophagitis +/- Mallory Weiss tear.  No active bleeding at this time. 3.  Abnormal CT abdomen, duodenitis.  Plan:  1.  Scheduled antiemetics. 2.  Clamp NGT and trial of sips of clears. 3.  PPI. 4.  Endoscopy tomorrow, unless he has decompensation. 5.  Risks (bleeding, infection, bowel perforation that could require surgery, sedation-related changes in cardiopulmonary systems), benefits (identification and possible treatment of source of symptoms, exclusion of certain causes of symptoms), and alternatives (watchful waiting, radiographic imaging studies, empiric medical treatment) of upper endoscopy (EGD) were explained to patient/family in detail and patient wishes to proceed.   LOS: 1 day   Derius Ghosh M  11/27/2014, 8:38 AM

## 2014-11-28 ENCOUNTER — Inpatient Hospital Stay (HOSPITAL_COMMUNITY): Payer: Medicaid Other | Admitting: *Deleted

## 2014-11-28 ENCOUNTER — Encounter (HOSPITAL_COMMUNITY)
Admission: EM | Disposition: A | Discharge status: Nursing Home (Includes ICF) | Payer: Self-pay | Source: Home / Self Care | Attending: Internal Medicine

## 2014-11-28 ENCOUNTER — Encounter (HOSPITAL_COMMUNITY): Payer: Self-pay

## 2014-11-28 HISTORY — PX: ESOPHAGOGASTRODUODENOSCOPY (EGD) WITH PROPOFOL: SHX5813

## 2014-11-28 LAB — HEMOGLOBIN A1C
Hgb A1c MFr Bld: 5.9 % — ABNORMAL HIGH (ref 4.8–5.6)
Mean Plasma Glucose: 123 mg/dL

## 2014-11-28 LAB — CBC
HCT: 48.5 % (ref 39.0–52.0)
HEMOGLOBIN: 15.9 g/dL (ref 13.0–17.0)
MCH: 29.6 pg (ref 26.0–34.0)
MCHC: 32.8 g/dL (ref 30.0–36.0)
MCV: 90.1 fL (ref 78.0–100.0)
Platelets: 153 10*3/uL (ref 150–400)
RBC: 5.38 MIL/uL (ref 4.22–5.81)
RDW: 13.5 % (ref 11.5–15.5)
WBC: 11.7 10*3/uL — ABNORMAL HIGH (ref 4.0–10.5)

## 2014-11-28 LAB — BASIC METABOLIC PANEL
Anion gap: 11 (ref 5–15)
BUN: 16 mg/dL (ref 6–23)
CHLORIDE: 108 mmol/L (ref 96–112)
CO2: 19 mmol/L (ref 19–32)
CREATININE: 0.96 mg/dL (ref 0.50–1.35)
Calcium: 8.4 mg/dL (ref 8.4–10.5)
GFR calc Af Amer: >90 mL/min (ref >90)
Glucose, Bld: 92 mg/dL (ref 70–99)
Potassium: 4.1 mmol/L (ref 3.5–5.1)
Sodium: 138 mmol/L (ref 135–145)

## 2014-11-28 SURGERY — ESOPHAGOGASTRODUODENOSCOPY (EGD) WITH PROPOFOL
Anesthesia: Monitor Anesthesia Care | Laterality: Left

## 2014-11-28 MED ORDER — PROPOFOL 10 MG/ML IV BOLUS
INTRAVENOUS | Status: AC
  Filled 2014-11-28: qty 20

## 2014-11-28 MED ORDER — FENTANYL CITRATE 0.05 MG/ML IJ SOLN
INTRAMUSCULAR | Status: AC
  Filled 2014-11-28: qty 2

## 2014-11-28 MED ORDER — PROPOFOL 10 MG/ML IV EMUL
INTRAVENOUS | Status: DC | PRN
  Administered 2014-11-28: 50 mg via INTRAVENOUS
  Administered 2014-11-28 (×2): 30 mg via INTRAVENOUS
  Administered 2014-11-28: 20 mg via INTRAVENOUS
  Administered 2014-11-28: 30 mg via INTRAVENOUS
  Administered 2014-11-28 (×2): 20 mg via INTRAVENOUS
  Administered 2014-11-28: 30 mg via INTRAVENOUS
  Administered 2014-11-28: 20 mg via INTRAVENOUS

## 2014-11-28 MED ORDER — LACTATED RINGERS IV SOLN
INTRAVENOUS | Status: DC | PRN
  Administered 2014-11-28: 13:00:00 via INTRAVENOUS

## 2014-11-28 MED ORDER — FENTANYL CITRATE 0.05 MG/ML IJ SOLN
INTRAMUSCULAR | Status: DC | PRN
Start: 2014-11-28 — End: 2014-11-28
  Administered 2014-11-28: 100 ug via INTRAVENOUS

## 2014-11-28 SURGICAL SUPPLY — 14 items

## 2014-11-28 NOTE — Progress Notes (Signed)
[facility] PROGRESS NOTE  WALID HAIG ONG:[id number] DOB: June 18, 1961 DOA: 11/26/2014 PCP: Vinnie Langton, NP  HPI/Subjective:  Patient feeling slightly better today than yesterday. NG in place, with intermittent suction.  Assessment/Plan:  Hematemesis  - May be due to Mallory-Weiss tear even extensive retching. - Continue IV Protonix BID. - plan for EGD today  - Continue to monitor CBC, hemoglobin stable.   Intractable nausea and vomiting  - Evidence of duodenitis on CT, treated with Protonix treatment nausea and vomiting symptomatically Uncontrolled hypertension  - Blood pressure improved, monitor  Chest pain  - In the setting of extensive vomiting and retching. - cardiac enzymes negative. Dehydration  - Due to GI losses. - Continue IV fluids. Metabolic acidosis  - Lactic acid normal, may be due to GI losses/dehydration. Chronic pain syndrome  - Control with IV meds as needed. Depression - Resume home psych medications.   Code Status: Full code Family Communication: No family at bedside, patient updated. Disposition Plan: Pending, continue in stepdown.  Consultants:  GI  Procedures:  CT of abdomen and pelvis on 11/26/2014.  Antibiotics:  None.  Objective: Filed Vitals:   11/28/14 1349 11/28/14 1350 11/28/14 1400 11/28/14 1419  BP: 161/106 148/82 163/90 137/87  Pulse: 96 97 87 90  Temp: 98.3 F (36.8 C)   98 F (36.7 C)  TempSrc: Oral   Oral  Resp: Height:      Weight:      SpO2: 95% 96% 95% 97%    Intake/Output Summary (Last 24 hours) at 11/28/14 1435 Last data filed at 11/28/14 1344  Gross per 24 hour  Intake   2410 ml  Output   1200 ml  Net   1210 ml   Filed Weights   11/26/14 1809 11/27/14 0015  Weight: 101.606 kg (224 lb) 98.5 kg (217 lb 2.5 oz)    Exam:  Physical Exam: General: Awake, Oriented, No acute distress, NG in place. HEENT: EOMI. Neck: Supple CV: S1 and S2 Lungs: Clear to ascultation  bilaterally Abdomen: Soft, Nontender, Nondistended, +bowel sounds. Ext: Good pulses. Trace edema.  Data Reviewed: Basic Metabolic Panel:  Recent Labs Lab 11/26/14 0809 11/27/14 0108 11/28/14 0430  NA 133* 141 138  K 4.4 4.0 4.1  CL 106 112 108  CO2 17* 17* 19  GLUCOSE 109* 126* 92  BUN 27* 24* 16  CREATININE 1.22 1.06 0.96  CALCIUM 9.0 8.3* 8.4  MG  --  2.0  --   PHOS  --  3.1  --    Liver Function Tests:  Recent Labs Lab 11/26/14 0809 11/27/14 0108  AST 25 25  ALT 26 24  ALKPHOS 76 69  BILITOT 1.2 1.0  PROT 8.5* 7.7  ALBUMIN 4.6 4.1    Recent Labs Lab 11/26/14 0809  LIPASE 23   CBC:  Recent Labs Lab 11/26/14 0809 11/26/14 1824 11/27/14 0108 11/28/14 0430  WBC 11.2* 13.7* 12.5* 11.7*  NEUTROABS 8.3*  --   --   --   HGB 19.4* 18.6* 17.7* 15.9  HCT 54.9* 52.6* 50.7 48.5  MCV 87.7 86.8 87.9 90.1  PLT 163 183 154 153   Cardiac Enzymes:  Recent Labs Lab 11/27/14 0027 11/27/14 0628 11/27/14 1237  TROPONINI <0.03 0.03 0.03    Recent Results (from the past 240 hour(s))  MRSA PCR Screening     Status: None   Collection Time: 11/27/14 12:22 AM  Result Value Ref Range Status   MRSA by PCR NEGATIVE  NEGATIVE Final    Comment:        The GeneXpert MRSA Assay (FDA approved for NASAL specimens only), is one component of a comprehensive MRSA colonization surveillance program. It is not intended to diagnose MRSA infection nor to guide or monitor treatment for MRSA infections.      Studies: Ct Abdomen Pelvis W Contrast  11/26/2014   CLINICAL DATA:  Initial evaluation for acute mid abdominal pain with nausea and vomiting.  EXAM: CT ABDOMEN AND PELVIS WITH CONTRAST  TECHNIQUE: Multidetector CT imaging of the abdomen and pelvis was performed using the standard protocol following bolus administration of intravenous contrast.  CONTRAST:  80mL OMNIPAQUE IOHEXOL 300 MG/ML  SOLN  COMPARISON:  Prior study from 03/29/2006.  FINDINGS: The visualized lung bases  are clear. No pleural or pericardial effusion.  Motion artifact limits evaluation of the upper abdomen.  A few scattered subcentimeter hypodensities noted within the liver, too small the characterize, but may reflect small cysts versus biliary hamartomas. Liver is otherwise unremarkable. Gallbladder grossly normal, although motion artifact limits evaluation. No biliary dilatation.  Spleen, adrenal glands, and pancreas are within normal limits.  Kidneys are equal in size with symmetric enhancement. Subcentimeter hypodense lesions within the bilateral kidneys are small the characterize, but statistically likely reflect a small cysts. No nephrolithiasis, hydronephrosis, or focal enhancing renal mass.  Stomach within normal limits. Small bowel of normal caliber without evidence of obstruction. There is mild wall thickening with mucosal enhancement within the second- third portion of the duodenum, suggesting mild acute duodenitis (series 3, image 25). Please note that motion artifact limits evaluation to this region Appendix well visualized in the right lower quadrant and is of normal caliber and appearance without associated inflammatory changes to suggest acute appendicitis. Colonic diverticulosis without acute diverticulitis. No acute inflammatory changes seen about the bowels.  Bladder within normal limits. Prostate mildly prominent measuring 4.8 cm in transverse diameter with coarse calcifications.  No free air or fluid. No pathologically enlarged intra-abdominal pelvic lymph nodes. Normal intravascular enhancement seen throughout the abdomen and pelvis.  No acute osseous abnormality. No worrisome lytic or blastic osseous lesions. Mild multilevel degenerative endplate spurring seen throughout the visualized spine. Bilateral facet arthrosis present within the lower lumbar spine.  IMPRESSION: 1. Mild wall thickening with mucosal enhancement and inflammatory stranding about the second- third portion of the duodenum,  suggesting acute duodenitis. 2. No other acute intra-abdominal pelvic process. 3. Normal appendix. 4. Colonic diverticulosis without acute diverticulitis.   Electronically Signed   By: Rise MuBenjamin  McClintock M.D.   On: 11/26/2014 21:13    Scheduled Meds: . desvenlafaxine  100 mg Oral Daily  . gabapentin  800 mg Oral TID  . metoCLOPramide (REGLAN) injection  5 mg Intravenous 4 times per day  . ondansetron (ZOFRAN) IV  4 mg Intravenous 4 times per day  . pantoprazole (PROTONIX) IV  40 mg Intravenous Q12H  . pregabalin  50 mg Oral TID  . propranolol  20 mg Oral Daily  . sodium chloride  3 mL Intravenous Q12H  . tiotropium  18 mcg Inhalation Daily  . traZODone  150 mg Oral QHS   Continuous Infusions:    Active Problems:   Chronic pain syndrome   Hematemesis   Intractable nausea and vomiting   Chest pain   Dehydration   Metabolic acidosis   Accelerated hypertension    Pamella PertGHERGHE, COSTIN, MD  [facility] Pager [phone]. If 7PM-7AM, please contact night-coverage at [email or web address], password [email or web address] 11/28/2014, 2:35 PM  LOS: 2 days

## 2014-11-28 NOTE — Interval H&P Note (Signed)
History and Physical Interval Note:  11/28/2014 1:22 PM  Jeffrey [name] W Tosi  has presented today for surgery, with the diagnosis of Hematemesis, chest pain, abdominal pain, duodenitis on CT scan  The various methods of treatment have been discussed with the patient and family. After consideration of risks, benefits and other options for treatment, the patient has consented to  Procedure(s): ESOPHAGOGASTRODUODENOSCOPY (EGD) WITH PROPOFOL (Left) as a surgical intervention .  The patient's history has been reviewed, patient examined, no change in status, stable for surgery.  I have reviewed the patient's chart and labs.  Questions were answered to the patient's satisfaction.     [name]  Assessment:  1.  Nausea and vomiting. 2.  Hematemesis. 3.  Abdominal pain. 4.  Duodenitis on CT.  Plan:  1.  Endoscopy. 2.  Risks (bleeding, infection, bowel perforation that could require surgery, sedation-related changes in cardiopulmonary systems), benefits (identification and possible treatment of source of symptoms, exclusion of certain causes of symptoms), and alternatives (watchful waiting, radiographic imaging studies, empiric medical treatment) of upper endoscopy (EGD) were explained to patient/family in detail and patient wishes to proceed.

## 2014-11-28 NOTE — Anesthesia Postprocedure Evaluation (Signed)
  Anesthesia Post-op Note  Patient: Jeffrey [name]  Procedure(s) Performed: Procedure(s): ESOPHAGOGASTRODUODENOSCOPY (EGD) WITH PROPOFOL (Left)  Patient Location: PACU  Anesthesia Type: MAC  Level of Consciousness: awake and alert   Airway and Oxygen Therapy: Patient Spontanous Breathing  Post-op Pain: none  Post-op Assessment: Post-op Vital signs reviewed, Patient's Cardiovascular Status Stable and Respiratory Function Stable  Post-op Vital Signs: Reviewed  Filed Vitals:   11/28/14 1400  BP: 163/90  Pulse: 87  Temp:   Resp: 21    Complications: No apparent anesthesia complications

## 2014-11-28 NOTE — Op Note (Signed)
[facility] [address] [place], [id number]   ENDOSCOPY PROCEDURE REPORT  PATIENT: Jeffrey [name], Jeffrey [name]  MR#: [id number] BIRTHDATE: 1961-03-13 , 53  yrs. old GENDER: male ENDOSCOPIST: Willis ModenaWilliam Midori Dado, MD REFERRED BY:  [facility] PROCEDURE DATE:  11/28/2014 PROCEDURE:  EGD, diagnostic ASA CLASS:     Class III INDICATIONS:  hematemesis, chest pain, abnormal CT (duodenitis). MEDICATIONS: Per Anesthesia TOPICAL ANESTHETIC:  DESCRIPTION OF PROCEDURE: After the risks benefits and alternatives of the procedure were thoroughly explained, informed consent was obtained.  The Pentax Gastroscope [id number] endoscope was introduced through the mouth and advanced to the second portion of the duodenum. The instrument was slowly withdrawn as the mucosa was fully examined.    Findings:  Nasogastric tube was removed after patient was sedated. Several long linear ulcerations in the mid-to-distal esophagus, highly compatible with reflux esophagitis.  GE junction was a bit edematous, but I did not readily discern a Mallory-Weiss tear. Stomach had some punctate erythematous marks, likely nasogastric tube suction marks.  Some mild antral gastritis noted as well.  In the duodenal bulb and descending duodenum, there was some generalized erythema and some patchy erosions linear clean-based serpiginous ulcerations.  No old or fresh blood was seen to the extent of our examination.              The scope was then withdrawn from the patient and the procedure completed.  COMPLICATIONS: There were no immediate complications.  ENDOSCOPIC IMPRESSION:     As above.  Hematemesis likely from reflux esophagitis.  Unclear source of duodenal erosions and ulcerations, though these endoscopic findings explain the patient's CT scan findings.  RECOMMENDATIONS:     1.  Watch for potential complications of procedure. 2.  Keep nasogastric tube out, and see how patient does. 3.  Full liquid  diet, advance slowly as tolerated. 4.  Antiemetics as-needed. 5.  IV pantoprazole today, consider transition to oral formulation tomorrow. 6.  Check  H. pylori serologies. 7.  Check gastrin level. 8.  [facility] will follow.  eSigned:  Willis ModenaWilliam Ashari Llewellyn, MD 11/28/2014 1:55 PM   CC:  CPT CODES: ICD CODES:  The ICD and CPT codes recommended by this software are interpretations from the data that the clinical staff has captured with the software.  The verification of the translation of this report to the ICD and CPT codes and modifiers is the sole responsibility of the health care institution and practicing physician where this report was generated.  [employer] will not be held responsible for the validity of the ICD and CPT codes included on this report.  AMA assumes no liability for data contained or not contained herein. CPT is a Publishing rights managerregistered trademark of the [employer].

## 2014-11-28 NOTE — Anesthesia Preprocedure Evaluation (Signed)
Anesthesia Evaluation  Patient identified by MRN, date of birth, ID band Patient awake    Reviewed: Allergy & Precautions, H&P , NPO status , Patient's Chart, lab work & pertinent test results, reviewed documented beta blocker date and time   Airway Mallampati: II  TM Distance: >3 FB Neck ROM: Full    Dental no notable dental hx. (+) Partial Lower, Partial Upper, Dental Advisory Given   Pulmonary COPD COPD inhaler, former smoker,  breath sounds clear to auscultation  Pulmonary exam normal       Cardiovascular hypertension, Pt. on medications and On Home Beta Blockers Rhythm:Regular Rate:Normal     Neuro/Psych PSYCHIATRIC DISORDERS Anxiety Depression negative neurological ROS     GI/Hepatic negative GI ROS, Neg liver ROS, GERD-  Medicated and Controlled,  Endo/Other  negative endocrine ROS  Renal/GU negative Renal ROS  negative genitourinary   Musculoskeletal   Abdominal   Peds  Hematology negative hematology ROS (+)   Anesthesia Other Findings   Reproductive/Obstetrics negative OB ROS                             Anesthesia Physical Anesthesia Plan  ASA: III  Anesthesia Plan: MAC   Post-op Pain Management:    Induction: Intravenous  Airway Management Planned: Nasal Cannula  Additional Equipment:   Intra-op Plan:   Post-operative Plan:   Informed Consent: I have reviewed the patients History and Physical, chart, labs and discussed the procedure including the risks, benefits and alternatives for the proposed anesthesia with the patient or authorized representative who has indicated his/her understanding and acceptance.   Dental advisory given  Plan Discussed with: CRNA  Anesthesia Plan Comments:         Anesthesia Quick Evaluation

## 2014-11-28 NOTE — H&P (View-Only) (Signed)
[facility] Consultation Note  Referring Provider: Dr. Srikar Reddy ([facility]) Primary Care Physician:  IBRAHIM, SADAOU, NP Primary Gastroenterologist:  Dr. John Hayes  Reason for Consultation:  Hematemesis  HPI: Jeffrey [name] is a 54 y.o. male admitted for hematemesis.  For the past several days, patient has had progressive generalized abdominal pain and nausea and vomiting.  Ultimately developed some bloody emesis.  Symptoms persistent and thus he sought admission to ED.  No melena or hematochezia.  Is having some chest discomfort as well after multiple episodes of emesis.  Chest CT negative for pulmonary embolism.  CT abdomen showed duodenitis.  No NSAIDs.  Last endoscopy 2007 by Dr. Hayes showed antritis.   Past Medical History  Diagnosis Date  . Chronic pain   . Depression   . Anxiety   . Suicidal overdose   . GERD (gastroesophageal reflux disease)   . Muscle spasms 05/09/2013    Right trapezoid  . COPD (chronic obstructive pulmonary disease)   . Hypertension     Past Surgical History  Procedure Laterality Date  . Ankle surgery    . Hernia repair    . Neck fusion      Prior to Admission medications   Medication Sig Start Date End Date Taking? Authorizing Provider  desvenlafaxine (PRISTIQ) 100 MG 24 hr tablet Take 1 tablet (100 mg total) by mouth daily. For depression 05/17/13  Yes Neil Mashburn, PA-C  fluticasone (FLONASE) 50 MCG/ACT nasal spray Place 2 sprays into both nostrils daily.   Yes Historical Provider, MD  lidocaine (LIDODERM) 5 % Place 1 patch onto the skin daily. Remove & Discard patch within 12 hours or as directed by MD 04/05/13  Yes Laura Davis, NP  loratadine (CLARITIN) 10 MG tablet Take 10 mg by mouth daily.   Yes Historical Provider, MD  Multiple Vitamin (MULTIVITAMIN WITH MINERALS) TABS tablet Take 1 tablet by mouth daily. 05/17/13  Yes Neil Mashburn, PA-C  pregabalin (LYRICA) 50 MG capsule Take 50 mg by mouth 3 (three) times daily.   Yes Historical  Provider, MD  propranolol (INDERAL) 20 MG tablet Take 20 mg by mouth daily.   Yes Historical Provider, MD  ranitidine (ZANTAC) 150 MG tablet Take 150 mg by mouth 2 (two) times daily.   Yes Historical Provider, MD  terbinafine (LAMISIL) 1 % cream Apply 1 application topically 2 (two) times daily.   Yes Historical Provider, MD  tiotropium (SPIRIVA) 18 MCG inhalation capsule Place 18 mcg into inhaler and inhale daily.   Yes Historical Provider, MD  albuterol (PROVENTIL HFA;VENTOLIN HFA) 108 (90 BASE) MCG/ACT inhaler Inhale 2 puffs into the lungs every 6 (six) hours as needed for wheezing or shortness of breath.    Historical Provider, MD  cyclobenzaprine (FLEXERIL) 5 MG tablet Take 5 mg by mouth 3 (three) times daily.     Historical Provider, MD  dextromethorphan (DELSYM) 30 MG/5ML liquid Take 5 mLs (30 mg total) by mouth as needed for cough. Patient not taking: Reported on 11/26/2014 08/05/14   Robert Browning, PA-C  dicyclomine (BENTYL) 20 MG tablet Take 20 mg by mouth 4 (four) times daily -  before meals and at bedtime.    Historical Provider, MD  gabapentin (NEURONTIN) 400 MG capsule Take 800 mg by mouth 3 (three) times daily.    Historical Provider, MD  hydrOXYzine (VISTARIL) 25 MG capsule Take 25-50 mg by mouth 2 (two) times daily. 1 cap in AM and 2 caps in PM    Historical Provider, MD    loperamide (IMODIUM) 2 MG capsule Take 2 mg by mouth 4 (four) times daily as needed for diarrhea or loose stools (diarrhea).     Historical Provider, MD  promethazine (PHENERGAN) 25 MG tablet Take 1 tablet (25 mg total) by mouth every 6 (six) hours as needed for nausea or vomiting. 11/26/14   Erin O'Malley, PA-C  simvastatin (ZOCOR) 40 MG tablet Take 40 mg by mouth daily.    Historical Provider, MD  SUMAtriptan (IMITREX) 50 MG tablet Take 1 tablet (50 mg total) by mouth every 2 (two) hours as needed for migraine. 04/05/13   Laura Davis, NP  traMADol (ULTRAM) 50 MG tablet Take 1 tablet (50 mg total) by mouth at  bedtime. 05/17/13   Neil Mashburn, PA-C  traZODone (DESYREL) 150 MG tablet Take 150 mg by mouth at bedtime.    Historical Provider, MD    Current Facility-Administered Medications  Medication Dose Route Frequency Provider Last Rate Last Dose  . 0.9 %  sodium chloride infusion   Intravenous Continuous Anastassia Doutova, MD 100 mL/hr at 11/27/14 0600    . albuterol (PROVENTIL) (2.5 MG/3ML) 0.083% nebulizer solution 2.5 mg  2.5 mg Nebulization Q6H PRN Anastassia Doutova, MD      . gabapentin (NEURONTIN) capsule 800 mg  800 mg Oral TID Anastassia Doutova, MD      . labetalol (NORMODYNE,TRANDATE) injection 10 mg  10 mg Intravenous Q2H PRN Anastassia Doutova, MD   10 mg at 11/27/14 0511  . metoCLOPramide (REGLAN) injection 5 mg  5 mg Intravenous 4 times per day Anastassia Doutova, MD   5 mg at 11/27/14 0510  . morphine 2 MG/ML injection 2 mg  2 mg Intravenous Q4H PRN Anastassia Doutova, MD   2 mg at 11/27/14 0806  . ondansetron (ZOFRAN) tablet 4 mg  4 mg Oral Q6H PRN Anastassia Doutova, MD       Or  . ondansetron (ZOFRAN) injection 4 mg  4 mg Intravenous Q6H PRN Anastassia Doutova, MD   4 mg at 11/27/14 0510  . pantoprazole (PROTONIX) injection 40 mg  40 mg Intravenous Q12H Anastassia Doutova, MD   40 mg at 11/27/14 0045  . pregabalin (LYRICA) capsule 50 mg  50 mg Oral TID Anastassia Doutova, MD      . promethazine (PHENERGAN) injection 25 mg  25 mg Intravenous Q6H PRN Anastassia Doutova, MD   25 mg at 11/27/14 0045  . propranolol (INDERAL) tablet 20 mg  20 mg Oral Daily Anastassia Doutova, MD      . sodium chloride 0.9 % injection 3 mL  3 mL Intravenous Q12H Anastassia Doutova, MD   3 mL at 11/27/14 0030  . tiotropium (SPIRIVA) inhalation capsule 18 mcg  18 mcg Inhalation Daily Anastassia Doutova, MD      . traZODone (DESYREL) tablet 150 mg  150 mg Oral QHS Anastassia Doutova, MD        Allergies as of 11/26/2014 - Review Complete 11/26/2014  Allergen Reaction Noted  . Abilify [aripiprazole]  Swelling 03/29/2013  . Ativan [lorazepam]  03/29/2013  . Celexa [citalopram]  03/29/2013  . Effexor [venlafaxine]  03/29/2013  . Levaquin [levofloxacin] Diarrhea 03/29/2013  . Lexapro [escitalopram oxalate] Other (See Comments) 04/08/2013  . Lipitor [atorvastatin]  03/29/2013  . Nsaids  05/09/2013  . Prozac [fluoxetine]  03/29/2013  . Toradol [ketorolac tromethamine] Nausea And Vomiting 03/29/2013  . Tylenol [acetaminophen]  03/29/2013  . Valium [diazepam]  03/29/2013  . Wellbutrin [bupropion] Other (See Comments) 05/09/2013    Family History  Problem   Relation Age of Onset  . Heart failure Mother   . CAD Father     History   Social History  . Marital Status: Single    Spouse Name: N/A  . Number of Children: N/A  . Years of Education: N/A   Occupational History  . Not on file.   Social History Main Topics  . Smoking status: Former Smoker -- 0.25 packs/day for 39 years    Types: Cigarettes  . Smokeless tobacco: Not on file  . Alcohol Use: No     Comment: quit in 2003  . Drug Use: Yes    Special: "Crack" cocaine     Comment: not since 2014  . Sexual Activity: Not on file   Other Topics Concern  . Not on file   Social History Narrative    Review of Systems: ROS Dr. Doutova 11/26/14 reviewed and I agree  Physical Exam: Vital signs in last 24 hours: Temp:  [97.7 F (36.5 C)-98.4 F (36.9 C)] 98 F (36.7 C) (02/22 0800) Pulse Rate:  [80-115] 94 (02/22 0800) Resp:  [13-21] 13 (02/22 0800) BP: (145-189)/(92-121) 150/93 mmHg (02/22 0800) SpO2:  [91 %-100 %] 91 % (02/22 0800) Weight:  [98.5 kg (217 lb 2.5 oz)-101.606 kg (224 lb)] 98.5 kg (217 lb 2.5 oz) (02/22 0015) Last BM Date: 11/26/14 General:   Alert,  Well-developed, well-nourished, pleasant and cooperative in NAD Head:  Normocephalic and atraumatic. Eyes:  Sclera clear, no icterus.   Conjunctiva pink. Ears:  Normal auditory acuity. Nose:  No deformity, discharge,  or lesions.  NGT in place with bilious  drainage; no bloody drainage Mouth:  No deformity or lesions.  Oropharynx pink & moist. Neck:  Supple; no masses or thyromegaly. Lungs:  Clear throughout to auscultation.   No wheezes, crackles, or rhonchi. No acute distress. Heart:  Regular rate and rhythm; no murmurs, clicks, rubs,  or gallops. Abdomen:  Soft, mild generalized tenderness, nondistended. No masses, hepatosplenomegaly or hernias noted. Normal bowel sounds, without guarding, and without rebound.    No peritonitis Msk:  Symmetrical without gross deformities. Normal posture. Pulses:  Normal pulses noted. Extremities:  Without clubbing or edema. Neurologic:  Alert and  oriented x4;  grossly normal neurologically. Skin:  Intact without significant lesions or rashes. Psych:  Alert and cooperative. Normal mood and affect.   Lab Results:  Recent Labs  11/26/14 0809 11/26/14 1824 11/27/14 0108  WBC 11.2* 13.7* 12.5*  HGB 19.4* 18.6* 17.7*  HCT 54.9* 52.6* 50.7  PLT 163 183 154   BMET  Recent Labs  11/26/14 0809 11/27/14 0108  NA 133* 141  K 4.4 4.0  CL 106 112  CO2 17* 17*  GLUCOSE 109* 126*  BUN 27* 24*  CREATININE 1.22 1.06  CALCIUM 9.0 8.3*   LFT  Recent Labs  11/27/14 0108  PROT 7.7  ALBUMIN 4.1  AST 25  ALT 24  ALKPHOS 69  BILITOT 1.0   PT/INR  Recent Labs  11/27/14 0100  LABPROT 14.6  INR 1.13    Studies/Results: Dg Chest 2 View  11/26/2014   CLINICAL DATA:  Nausea vomiting and diarrhea.  Sweats and weakness.  EXAM: CHEST  2 VIEW  COMPARISON:  08/05/2014  FINDINGS: The heart size and mediastinal contours are within normal limits. Both lungs are clear. The visualized skeletal structures are unremarkable.  IMPRESSION: No active cardiopulmonary disease.   Electronically Signed   By: Taylor  Stroud M.D.   On: 11/26/2014 08:37   Ct Angio Chest   Pe W/cm &/or Wo Cm  11/26/2014   CLINICAL DATA:  Acute intermittent chest pain with vomiting. History of pulmonary embolus. Elevated D-dimer.  EXAM: CT  ANGIOGRAPHY CHEST WITH CONTRAST  TECHNIQUE: Multidetector CT imaging of the chest was performed using the standard protocol during bolus administration of intravenous contrast. Multiplanar CT image reconstructions and MIPs were obtained to evaluate the vascular anatomy.  CONTRAST:  100mL OMNIPAQUE IOHEXOL 350 MG/ML SOLN  COMPARISON:  11/26/2014, 06/06/2009  FINDINGS: Pulmonary arteries are well visualized. No significant filling defect or pulmonary embolus demonstrated by CTA. Intact thoracic aorta. Negative for aneurysm or dissection. No mediastinal hemorrhage or hematoma. No adenopathy. Normal heart size. No pericardial or pleural effusion. Negative for hiatal hernia.  Lung windows demonstrate overall low volumes with bibasilar vascular crowding and hypoventilatory changes. Scarring or atelectasis in the inferior lingula and medial right middle lobe. Trachea and central airways are patent. Stable well-circumscribed sub cm nodules in the medial right upper lobe, images 27 and 29. Stable punctate sub cm nodule in the lingula, image 47.  Diffuse degenerative changes of the thoracic spine. No acute osseous finding.  Included upper abdomen demonstrates scattered sub cm hypodensities throughout the liver, suspect small hepatic cysts. These are too small to definitively characterize. No biliary dilatation. No acute upper abdominal findings.  Review of the MIP images confirms the above findings.  IMPRESSION: Negative for significant acute pulmonary embolus CTA.  Low lung volumes with bibasilar hypoventilatory changes and atelectasis.  Stable right upper lobe and lingula sub cm pulmonary nodules compared to 06/06/2009 compatible with benign nodules.   Electronically Signed   By: M.  Shick M.D.   On: 11/26/2014 10:47   Ct Abdomen Pelvis W Contrast  11/26/2014   CLINICAL DATA:  Initial evaluation for acute mid abdominal pain with nausea and vomiting.  EXAM: CT ABDOMEN AND PELVIS WITH CONTRAST  TECHNIQUE: Multidetector CT  imaging of the abdomen and pelvis was performed using the standard protocol following bolus administration of intravenous contrast.  CONTRAST:  80mL OMNIPAQUE IOHEXOL 300 MG/ML  SOLN  COMPARISON:  Prior study from 03/29/2006.  FINDINGS: The visualized lung bases are clear. No pleural or pericardial effusion.  Motion artifact limits evaluation of the upper abdomen.  A few scattered subcentimeter hypodensities noted within the liver, too small the characterize, but may reflect small cysts versus biliary hamartomas. Liver is otherwise unremarkable. Gallbladder grossly normal, although motion artifact limits evaluation. No biliary dilatation.  Spleen, adrenal glands, and pancreas are within normal limits.  Kidneys are equal in size with symmetric enhancement. Subcentimeter hypodense lesions within the bilateral kidneys are small the characterize, but statistically likely reflect a small cysts. No nephrolithiasis, hydronephrosis, or focal enhancing renal mass.  Stomach within normal limits. Small bowel of normal caliber without evidence of obstruction. There is mild wall thickening with mucosal enhancement within the second- third portion of the duodenum, suggesting mild acute duodenitis (series 3, image 25). Please note that motion artifact limits evaluation to this region Appendix well visualized in the right lower quadrant and is of normal caliber and appearance without associated inflammatory changes to suggest acute appendicitis. Colonic diverticulosis without acute diverticulitis. No acute inflammatory changes seen about the bowels.  Bladder within normal limits. Prostate mildly prominent measuring 4.8 cm in transverse diameter with coarse calcifications.  No free air or fluid. No pathologically enlarged intra-abdominal pelvic lymph nodes. Normal intravascular enhancement seen throughout the abdomen and pelvis.  No acute osseous abnormality. No worrisome lytic or blastic osseous lesions. Mild multilevel    degenerative endplate spurring seen throughout the visualized spine. Bilateral facet arthrosis present within the lower lumbar spine.  IMPRESSION: 1. Mild wall thickening with mucosal enhancement and inflammatory stranding about the second- third portion of the duodenum, suggesting acute duodenitis. 2. No other acute intra-abdominal pelvic process. 3. Normal appendix. 4. Colonic diverticulosis without acute diverticulitis.   Electronically Signed   By: Benjamin  McClintock M.D.   On: 11/26/2014 21:13   Impression:  1.  Nausea and vomiting.   2.  Hematemesis.  Suspect esophagitis +/- Mallory Weiss tear.  No active bleeding at this time. 3.  Abnormal CT abdomen, duodenitis.  Plan:  1.  Scheduled antiemetics. 2.  Clamp NGT and trial of sips of clears. 3.  PPI. 4.  Endoscopy tomorrow, unless he has decompensation. 5.  Risks (bleeding, infection, bowel perforation that could require surgery, sedation-related changes in cardiopulmonary systems), benefits (identification and possible treatment of source of symptoms, exclusion of certain causes of symptoms), and alternatives (watchful waiting, radiographic imaging studies, empiric medical treatment) of upper endoscopy (EGD) were explained to patient/family in detail and patient wishes to proceed.   LOS: 1 day   Oriyah Lamphear M  11/27/2014, 8:38 AM   

## 2014-11-28 NOTE — Transfer of Care (Signed)
Immediate Anesthesia Transfer of Care Note  Patient: Jeffrey [name]  Procedure(s) Performed: Procedure(s): ESOPHAGOGASTRODUODENOSCOPY (EGD) WITH PROPOFOL (Left)  Patient Location: PACU and Endoscopy Unit  Anesthesia Type:MAC  Level of Consciousness: awake, alert  and oriented  Airway & Oxygen Therapy: Patient Spontanous Breathing and Patient connected to nasal cannula oxygen  Post-op Assessment: Report given to RN and Post -op Vital signs reviewed and stable  Post vital signs: Reviewed and stable  Last Vitals:  Filed Vitals:   11/28/14 1156  BP: 168/101  Pulse:   Temp: 36.8 C  Resp: 16    Complications: No apparent anesthesia complications

## 2014-11-29 ENCOUNTER — Encounter (HOSPITAL_COMMUNITY): Payer: Self-pay | Admitting: Gastroenterology

## 2014-11-29 LAB — GASTRIN: Gastrin: 112 pg/mL (ref 0–115)

## 2014-11-29 MED ORDER — ONDANSETRON HCL 4 MG PO TABS: 4.0000 mg | ORAL_TABLET | Freq: Three times a day (TID) | ORAL | Status: DC | PRN

## 2014-11-29 MED ORDER — PANTOPRAZOLE SODIUM 40 MG PO TBEC
40.0000 mg | DELAYED_RELEASE_TABLET | Freq: Two times a day (BID) | ORAL | Status: DC
  Administered 2014-11-29 – 2014-11-30 (×3): 40 mg via ORAL
  Filled 2014-11-29 (×3): qty 1

## 2014-11-29 NOTE — Progress Notes (Signed)
Subjective: Abdominal pain improving. Nausea and vomiting improving. Tolerating current diet.  Objective: Vital signs in last 24 hours: Temp:  [98 F (36.7 C)-98.8 F (37.1 C)] 98.2 F (36.8 C) (02/24 0543) Pulse Rate:  [73-97] 73 (02/24 0543) Resp:  [16-24] 20 (02/24 0543) BP: (103-168)/(59-106) 125/80 mmHg (02/24 0543) SpO2:  [92 %-97 %] 92 % (02/24 0819) Weight change:  Last BM Date: 11/26/14  PE: GEN:  NAD ABD:  Mild epigastric tenderness, no peritonitis  Lab Results: CBC    Component Value Date/Time   WBC 11.7* 11/28/2014 0430   RBC 5.38 11/28/2014 0430   HGB 15.9 11/28/2014 0430   HCT 48.5 11/28/2014 0430   PLT 153 11/28/2014 0430   MCV 90.1 11/28/2014 0430   MCH 29.6 11/28/2014 0430   MCHC 32.8 11/28/2014 0430   RDW 13.5 11/28/2014 0430   LYMPHSABS 2.0 11/26/2014 0809   MONOABS 0.8 11/26/2014 0809   EOSABS 0.1 11/26/2014 0809   BASOSABS 0.0 11/26/2014 0809   CMP     Component Value Date/Time   NA 138 11/28/2014 0430   K 4.1 11/28/2014 0430   CL 108 11/28/2014 0430   CO2 19 11/28/2014 0430   GLUCOSE 92 11/28/2014 0430   BUN 16 11/28/2014 0430   CREATININE 0.96 11/28/2014 0430   CALCIUM 8.4 11/28/2014 0430   PROT 7.7 11/27/2014 0108   ALBUMIN 4.1 11/27/2014 0108   AST 25 11/27/2014 0108   ALT 24 11/27/2014 0108   ALKPHOS 69 11/27/2014 0108   BILITOT 1.0 11/27/2014 0108   GFRNONAA >90 11/28/2014 0430   GFRAA >90 11/28/2014 0430   H. Pylori serologies and gastrin level pending  Assessment:  1.  Abdominal pain, improving. 2.  Hematemesis and chest pain, improving, likely from esophagitis. 3.  Duodenitis on CT scan, corresponding to duodenal ulcerations and erosions on endoscopy.  Plan:  1.  Advance diet slowly as tolerated. 2.  Transition to oral pantoprazole 40 mg po bid, continue now and upon discharge. 3.  Discontinue metoclopramide. 4.  Transition to oral ondansetron, continue 4 mg po/SL q 6 hrs prn, now and upon discharge. 5.  Awaiting  H. Pylori and gastrin levels, which can be follow-up upon discharge. 6.  If able to tolerate diet well today without interval increase in abdominal pain or nausea/vomiting, could consider discharge home later today or in the morning. 7.  No NSAIDs. 8.  Will arrange outpatient follow-up with me.   Jeffrey [name],Jeffrey [name] M 11/29/2014, 9:02 AM

## 2014-11-29 NOTE — Progress Notes (Signed)
[facility] PROGRESS NOTE  Jeffrey [name] ZOX:[id number] DOB: 10/06/1961 DOA: 11/26/2014 PCP: Vinnie Langton, NP  HPI/Subjective: Feeling better, d/p EGD yesterdat   Assessment/Plan:  Hematemesis  - May be due to Mallory-Weiss tear even extensive retching. - EGD done yesterday showed duodenal generalized erythema and some patchy erosions, without a clear source - Continue to monitor CBC, hemoglobin stable.   - Continue PPI Intractable nausea and vomiting  - Evidence of duodenitis on CT, treated with Protonix treatment nausea and vomiting symptomatically - Improving, tolerating a diet today Uncontrolled hypertension  - Blood pressure improved, monitor  Chest pain  - In the setting of extensive vomiting and retching. - cardiac enzymes negative. Dehydration  - Due to GI losses. - Resolved Metabolic acidosis  - Lactic acid normal, may be due to GI losses/dehydration. Chronic pain syndrome  - Control with IV meds as needed. Depression - Resume home psych medications.   Code Status: Full code Family Communication: No family at bedside, patient updated. Disposition Plan: home in am   Consultants:  GI  Procedures:  CT of abdomen and pelvis on 11/26/2014.  Antibiotics:  None.  Objective: Filed Vitals:   11/28/14 1419 11/28/14 2008 11/29/14 0543 11/29/14 0819  BP: 137/87 103/59 125/80   Pulse: 90 82 73   Temp: 98 F (36.7 C) 98.8 F (37.1 C) 98.2 F (36.8 C)   TempSrc: Oral Oral Oral   Resp: Height:      Weight:      SpO2: 97% 96% 92% 92%    Intake/Output Summary (Last 24 hours) at 11/29/14 1225 Last data filed at 11/29/14 8119  Gross per 24 hour  Intake    700 ml  Output    302 ml  Net    398 ml   Filed Weights   11/26/14 1809 11/27/14 0015  Weight: 101.606 kg (224 lb) 98.5 kg (217 lb 2.5 oz)    Exam:  Physical Exam: General: Awake, Oriented, No acute distress, eating breakfast HEENT: EOMI. Neck: Supple CV: S1 and  S2 Lungs: Clear to ascultation bilaterally Abdomen: Soft, Nontender, Nondistended, +bowel sounds. Ext: Good pulses. Trace edema.  Data Reviewed: Basic Metabolic Panel:  Recent Labs Lab 11/26/14 0809 11/27/14 0108 11/28/14 0430  NA 133* 141 138  K 4.4 4.0 4.1  CL 106 112 108  CO2 17* 17* 19  GLUCOSE 109* 126* 92  BUN 27* 24* 16  CREATININE 1.22 1.06 0.96  CALCIUM 9.0 8.3* 8.4  MG  --  2.0  --   PHOS  --  3.1  --    Liver Function Tests:  Recent Labs Lab 11/26/14 0809 11/27/14 0108  AST 25 25  ALT 26 24  ALKPHOS 76 69  BILITOT 1.2 1.0  PROT 8.5* 7.7  ALBUMIN 4.6 4.1    Recent Labs Lab 11/26/14 0809  LIPASE 23   CBC:  Recent Labs Lab 11/26/14 0809 11/26/14 1824 11/27/14 0108 11/28/14 0430  WBC 11.2* 13.7* 12.5* 11.7*  NEUTROABS 8.3*  --   --   --   HGB 19.4* 18.6* 17.7* 15.9  HCT 54.9* 52.6* 50.7 48.5  MCV 87.7 86.8 87.9 90.1  PLT 163 183 154 153   Cardiac Enzymes:  Recent Labs Lab 11/27/14 0027 11/27/14 0628 11/27/14 1237  TROPONINI <0.03 0.03 0.03    Recent Results (from the past 240 hour(s))  MRSA PCR Screening     Status: None   Collection Time: 11/27/14 12:22 AM  Result Value  Ref Range Status   MRSA by PCR NEGATIVE NEGATIVE Final    Comment:        The GeneXpert MRSA Assay (FDA approved for NASAL specimens only), is one component of a comprehensive MRSA colonization surveillance program. It is not intended to diagnose MRSA infection nor to guide or monitor treatment for MRSA infections.      Studies: No results found.  Scheduled Meds: . desvenlafaxine  100 mg Oral Daily  . gabapentin  800 mg Oral TID  . pantoprazole  40 mg Oral BID  . pregabalin  50 mg Oral TID  . propranolol  20 mg Oral Daily  . sodium chloride  3 mL Intravenous Q12H  . tiotropium  18 mcg Inhalation Daily  . traZODone  150 mg Oral QHS   Continuous Infusions:    Active Problems:   Chronic pain syndrome   Hematemesis   Intractable nausea and  vomiting   Chest pain   Dehydration   Metabolic acidosis   Accelerated hypertension    Pamella PertGHERGHE, COSTIN, MD  [facility] Pager [phone]. If 7PM-7AM, please contact night-coverage at [email or web address], password [email or web address] 11/29/2014, 12:25 PM  LOS: 3 days

## 2014-11-30 LAB — HELICOBACTER PYLORI ABS-IGG+IGA, BLD
H Pylori IgA: <9 U (ref 0.0–8.9)
H Pylori IgG: <0.9 U/mL (ref 0.0–0.8)

## 2014-11-30 MED ORDER — OXYCODONE HCL 5 MG PO TABS: 5.0000 mg | ORAL_TABLET | ORAL | Status: AC | PRN

## 2014-11-30 MED ORDER — PANTOPRAZOLE SODIUM 40 MG PO TBEC: 40.0000 mg | DELAYED_RELEASE_TABLET | Freq: Two times a day (BID) | ORAL | Status: AC

## 2014-11-30 NOTE — Discharge Summary (Signed)
Physician Discharge Summary  DANTAE [name] NFA:[id number] DOB: [birth year] DOA: 11/26/2014  PCP: Vinnie Langton, NP  Admit date: 11/26/2014 Discharge date: 11/30/2014  Time spent: 45 minutes  Recommendations for Outpatient Follow-up:  1. Follow up with PCP in 2 weeks 2. Follow up with Dr. Dulce Sellar in 6-8 weeks 3. Continue Protonix twice daily until seen by Dr. Dulce Sellar   Discharge Diagnoses:  Active Problems:   Chronic pain syndrome   Hematemesis   Intractable nausea and vomiting   Chest pain   Dehydration   Metabolic acidosis   Accelerated hypertension  Discharge Condition: stable  Diet recommendation: regular  Filed Weights   11/26/14 1809 11/27/14 0015  Weight: 101.606 kg (224 lb) 98.5 kg (217 lb 2.5 oz)   History of present illness:  Jeffrey [name] is a 54 y.o. male has a past medical history of Chronic pain; Depression; Anxiety; Suicidal overdose; GERD (gastroesophageal reflux disease); Muscle spasms (05/09/2013); COPD (chronic obstructive pulmonary disease); and Hypertension. Presented with Patient reports onset of nausea and vomiting, reports mild diarrhea. for the past 4 days. He have not been eating much. This AM patient felt light headed and asked to call 911. Patient resides at [facility] assisted living.  Hr reports some chest discomfort after persistent vomiting. He was evaluated in [facility] ER and given some fluids and discharged to Assisted living when he improved Initially patient had some compliant of shortness of braeth and had CTA done that showed no PE. Back at assisted living he reports his symptoms have recurred. This afternoon he noted some bright red blood in his vomit after he was retching hard. CT scan done showed duodenitis. He received some phenergan and dilaudid with some improvement. NG tube was placed showing small amount of brown gastric contents no overt bleeding noted but small a lot of coffee ground cannot be ruled out. Hemoccult negative  Hospital  Course:  Hematemesis  - resolved, ? Small Mallory-Weiss tear, GI was consulted and followed patient - EGD done 2/23 showed duodenal generalized erythema and some patchy erosions, without a clear source (full report below) - CBC has remained stable.  - patient was started on BID PPI and will have GI outpatient follow up Intractable nausea and vomiting  - Evidence of duodenitis on CT, treated with Protonix treatment nausea and vomiting symptomatically - Improving, tolerating a diet Hypertension  - on admission, likely in the setting of #1 and #2, resolved Chest pain  - In the setting of extensive vomiting and retching. - cardiac enzymes negative. Dehydration  - Due to GI losses. - Resolved Metabolic acidosis  - Lactic acid normal, may be due to GI losses/dehydration. Chronic pain syndrome  - resume previous medications. Short term oxycodone on discharge for abdominal pain. Depression - Resume home psych medications.   Procedures:  EGD 2/23 Findings: Nasogastric tube was removed after patient was sedated. Several long linear ulcerations in the mid-to-distal esophagus, highly compatible with reflux esophagitis. GE junction was a bit edematous, but I did not readily discern a Mallory-Weiss tear. Stomach had some punctate erythematous marks, likely nasogastric tube suction marks. Some mild antral gastritis noted as well. In the duodenal bulb and descending duodenum, there was some generalized erythema and some patchy erosions linear clean-based serpiginous ulcerations. No old or fresh blood was seen to the extent of our examination. The scope was then withdrawn from the patient and the procedure completed.  ENDOSCOPIC IMPRESSION: As above. Hematemesis likely from reflux esophagitis. Unclear source of duodenal erosions and  ulcerations, though these endoscopic findings explain the patient's CT scan findings.  Consultations:  Gastroenterology   Discharge Exam: Filed Vitals:     11/29/14 0819 11/29/14 1407 11/29/14 2110 11/30/14 0521  BP:  95/62 114/71 97/63  Pulse:  67 70 65  Temp:  97.9 F (36.6 C) 98.1 F (36.7 C) 97.8 F (36.6 C)  TempSrc:  Oral Oral Oral  Resp:  18 18 18   Height:      Weight:      SpO2: 92% 95% 96% 93%    General: NAD Cardiovascular: RRR Respiratory: CTA biL  Discharge Instructions     Medication List    TAKE these medications        albuterol 108 (90 BASE) MCG/ACT inhaler  Commonly known as:  PROVENTIL HFA;VENTOLIN HFA  Inhale 2 puffs into the lungs every 6 (six) hours as needed for wheezing or shortness of breath.     cyclobenzaprine 5 MG tablet  Commonly known as:  FLEXERIL  Take 5 mg by mouth 3 (three) times daily.     desvenlafaxine 100 MG 24 hr tablet  Commonly known as:  PRISTIQ  Take 1 tablet (100 mg total) by mouth daily. For depression     dextromethorphan 30 MG/5ML liquid  Commonly known as:  DELSYM  Take 5 mLs (30 mg total) by mouth as needed for cough.     dicyclomine 20 MG tablet  Commonly known as:  BENTYL  Take 20 mg by mouth 4 (four) times daily -  before meals and at bedtime.     fluticasone 50 MCG/ACT nasal spray  Commonly known as:  FLONASE  Place 2 sprays into both nostrils daily.     gabapentin 400 MG capsule  Commonly known as:  NEURONTIN  Take 800 mg by mouth 3 (three) times daily.     hydrOXYzine 25 MG capsule  Commonly known as:  VISTARIL  Take 25-50 mg by mouth 2 (two) times daily. 1 cap in AM and 2 caps in PM     lidocaine 5 %  Commonly known as:  LIDODERM  Place 1 patch onto the skin daily. Remove & Discard patch within 12 hours or as directed by MD     loperamide 2 MG capsule  Commonly known as:  IMODIUM  Take 2 mg by mouth 4 (four) times daily as needed for diarrhea or loose stools (diarrhea).     loratadine 10 MG tablet  Commonly known as:  CLARITIN  Take 10 mg by mouth daily.     multivitamin with minerals Tabs tablet  Take 1 tablet by mouth daily.     oxyCODONE  5 MG immediate release tablet  Commonly known as:  Oxy IR/ROXICODONE  Take 1 tablet (5 mg total) by mouth every 4 (four) hours as needed for severe pain.     pantoprazole 40 MG tablet  Commonly known as:  PROTONIX  Take 1 tablet (40 mg total) by mouth 2 (two) times daily.     pregabalin 50 MG capsule  Commonly known as:  LYRICA  Take 50 mg by mouth 3 (three) times daily.     promethazine 25 MG tablet  Commonly known as:  PHENERGAN  Take 1 tablet (25 mg total) by mouth every 6 (six) hours as needed for nausea or vomiting.     propranolol 20 MG tablet  Commonly known as:  INDERAL  Take 20 mg by mouth daily.     ranitidine 150 MG tablet  Commonly known as:  ZANTAC  Take 150 mg by mouth 2 (two) times daily.     simvastatin 40 MG tablet  Commonly known as:  ZOCOR  Take 40 mg by mouth daily.     SUMAtriptan 50 MG tablet  Commonly known as:  IMITREX  Take 1 tablet (50 mg total) by mouth every 2 (two) hours as needed for migraine.     terbinafine 1 % cream  Commonly known as:  LAMISIL  Apply 1 application topically 2 (two) times daily.     tiotropium 18 MCG inhalation capsule  Commonly known as:  SPIRIVA  Place 18 mcg into inhaler and inhale daily.     traMADol 50 MG tablet  Commonly known as:  ULTRAM  Take 1 tablet (50 mg total) by mouth at bedtime.     traZODone 150 MG tablet  Commonly known as:  DESYREL  Take 150 mg by mouth at bedtime.           Follow-up Information    Follow up with Vinnie Langton, NP. Schedule an appointment as soon as possible for a visit in 2 weeks.   Specialty:  Nurse Practitioner   Contact information:   [address] [place] [address] [phone]       The results of significant diagnostics from this hospitalization (including imaging, microbiology, ancillary and laboratory) are listed below for reference.    Significant Diagnostic Studies: Dg Chest 2 View  11/26/2014   CLINICAL DATA:  Nausea vomiting and diarrhea.  Sweats  and weakness.  EXAM: CHEST  2 VIEW  COMPARISON:  08/05/2014  FINDINGS: The heart size and mediastinal contours are within normal limits. Both lungs are clear. The visualized skeletal structures are unremarkable.  IMPRESSION: No active cardiopulmonary disease.   Electronically Signed   By: Signa Kell M.D.   On: 11/26/2014 08:37   Ct Angio Chest Pe W/cm &/or Wo Cm  11/26/2014   CLINICAL DATA:  Acute intermittent chest pain with vomiting. History of pulmonary embolus. Elevated D-dimer.  EXAM: CT ANGIOGRAPHY CHEST WITH CONTRAST  TECHNIQUE: Multidetector CT imaging of the chest was performed using the standard protocol during bolus administration of intravenous contrast. Multiplanar CT image reconstructions and MIPs were obtained to evaluate the vascular anatomy.  CONTRAST:  OMNIPAQUE IOHEXOL 350 MG/ML SOLN  COMPARISON:  11/26/2014, 06/06/2009  FINDINGS: Pulmonary arteries are well visualized. No significant filling defect or pulmonary embolus demonstrated by CTA. Intact thoracic aorta. Negative for aneurysm or dissection. No mediastinal hemorrhage or hematoma. No adenopathy. Normal heart size. No pericardial or pleural effusion. Negative for hiatal hernia.  Lung windows demonstrate overall low volumes with bibasilar vascular crowding and hypoventilatory changes. Scarring or atelectasis in the inferior lingula and medial right middle lobe. Trachea and central airways are patent. Stable well-circumscribed sub cm nodules in the medial right upper lobe, images 27 and 29. Stable punctate sub cm nodule in the lingula, image 47.  Diffuse degenerative changes of the thoracic spine. No acute osseous finding.  Included upper abdomen demonstrates scattered sub cm hypodensities throughout the liver, suspect small hepatic cysts. These are too small to definitively characterize. No biliary dilatation. No acute upper abdominal findings.  Review of the MIP images confirms the above findings.  IMPRESSION: Negative for  significant acute pulmonary embolus CTA.  Low lung volumes with bibasilar hypoventilatory changes and atelectasis.  Stable right upper lobe and lingula sub cm pulmonary nodules compared to 06/06/2009 compatible with benign nodules.   Electronically Signed   By: Judie Petit.  Shick  M.D.   On: 11/26/2014 10:47   Ct Abdomen Pelvis W Contrast  11/26/2014   CLINICAL DATA:  Initial evaluation for acute mid abdominal pain with nausea and vomiting.  EXAM: CT ABDOMEN AND PELVIS WITH CONTRAST  TECHNIQUE: Multidetector CT imaging of the abdomen and pelvis was performed using the standard protocol following bolus administration of intravenous contrast.  CONTRAST:  80mL OMNIPAQUE IOHEXOL 300 MG/ML  SOLN  COMPARISON:  Prior study from 03/29/2006.  FINDINGS: The visualized lung bases are clear. No pleural or pericardial effusion.  Motion artifact limits evaluation of the upper abdomen.  A few scattered subcentimeter hypodensities noted within the liver, too small the characterize, but may reflect small cysts versus biliary hamartomas. Liver is otherwise unremarkable. Gallbladder grossly normal, although motion artifact limits evaluation. No biliary dilatation.  Spleen, adrenal glands, and pancreas are within normal limits.  Kidneys are equal in size with symmetric enhancement. Subcentimeter hypodense lesions within the bilateral kidneys are small the characterize, but statistically likely reflect a small cysts. No nephrolithiasis, hydronephrosis, or focal enhancing renal mass.  Stomach within normal limits. Small bowel of normal caliber without evidence of obstruction. There is mild wall thickening with mucosal enhancement within the second- third portion of the duodenum, suggesting mild acute duodenitis (series 3, image 25). Please note that motion artifact limits evaluation to this region Appendix well visualized in the right lower quadrant and is of normal caliber and appearance without associated inflammatory changes to suggest acute  appendicitis. Colonic diverticulosis without acute diverticulitis. No acute inflammatory changes seen about the bowels.  Bladder within normal limits. Prostate mildly prominent measuring 4.8 cm in transverse diameter with coarse calcifications.  No free air or fluid. No pathologically enlarged intra-abdominal pelvic lymph nodes. Normal intravascular enhancement seen throughout the abdomen and pelvis.  No acute osseous abnormality. No worrisome lytic or blastic osseous lesions. Mild multilevel degenerative endplate spurring seen throughout the visualized spine. Bilateral facet arthrosis present within the lower lumbar spine.  IMPRESSION: 1. Mild wall thickening with mucosal enhancement and inflammatory stranding about the second- third portion of the duodenum, suggesting acute duodenitis. 2. No other acute intra-abdominal pelvic process. 3. Normal appendix. 4. Colonic diverticulosis without acute diverticulitis.   Electronically Signed   By: Rise MuBenjamin  McClintock M.D.   On: 11/26/2014 21:13    Microbiology: Recent Results (from the past 240 hour(s))  MRSA PCR Screening     Status: None   Collection Time: 11/27/14 12:22 AM  Result Value Ref Range Status   MRSA by PCR NEGATIVE NEGATIVE Final    Comment:        The GeneXpert MRSA Assay (FDA approved for NASAL specimens only), is one component of a comprehensive MRSA colonization surveillance program. It is not intended to diagnose MRSA infection nor to guide or monitor treatment for MRSA infections.      Labs: Basic Metabolic Panel:  Recent Labs Lab 11/26/14 0809 11/27/14 0108 11/28/14 0430  NA 133* 141 138  K 4.4 4.0 4.1  CL 106 112 108  CO2 17* 17* 19  GLUCOSE 109* 126* 92  BUN 27* 24* 16  CREATININE 1.22 1.06 0.96  CALCIUM 9.0 8.3* 8.4  MG  --  2.0  --   PHOS  --  3.1  --    Liver Function Tests:  Recent Labs Lab 11/26/14 0809 11/27/14 0108  AST 25 25  ALT 26 24  ALKPHOS 76 69  BILITOT 1.2 1.0  PROT 8.5* 7.7  ALBUMIN  4.6 4.1  Recent Labs Lab 11/26/14 0809  LIPASE 23   CBC:  Recent Labs Lab 11/26/14 0809 11/26/14 1824 11/27/14 0108 11/28/14 0430  WBC 11.2* 13.7* 12.5* 11.7*  NEUTROABS 8.3*  --   --   --   HGB 19.4* 18.6* 17.7* 15.9  HCT 54.9* 52.6* 50.7 48.5  MCV 87.7 86.8 87.9 90.1  PLT 163 183 154 153   Cardiac Enzymes:  Recent Labs Lab 11/27/14 0027 11/27/14 0628 11/27/14 1237  TROPONINI <0.03 0.03 0.03    Signed:  GHERGHE, COSTIN  [facility] 11/30/2014, 10:00 AM

## 2014-11-30 NOTE — Progress Notes (Signed)
Patient is set to discharge back to [facility] today. [facility]. Patient aware. Discharge packet given to RN, Shanda BumpsJessica. Patient to return to ALF via cab paid for by ALF.    Lincoln MaxinKelly Kaivon Livesey, LCSW [facility] Clinical Social Worker cell #: [phone]

## 2014-11-30 NOTE — Discharge Instructions (Signed)
Follow with Jeffrey LangtonIBRAHIM, SADAOU, NP in 1-2 weeks  Please get a complete blood count and chemistry panel checked by your Primary MD at your next visit, and again as instructed by your Primary MD. Please get your medications reviewed and adjusted by your Primary MD.  Please request your Primary MD to go over all Hospital Tests and Procedure/Radiological results at the follow up, please get all Hospital records sent to your Prim MD by signing hospital release before you go home.  If you had Pneumonia of Lung problems at the Hospital: Please get a 2 view Chest X ray done in 6-8 weeks after hospital discharge or sooner if instructed by your Primary MD.  If you have Congestive Heart Failure: Please call your Cardiologist or Primary MD anytime you have any of the following symptoms:  1) 3 pound weight gain in 24 hours or 5 pounds in 1 week  2) shortness of breath, with or without a dry hacking cough  3) swelling in the hands, feet or stomach  4) if you have to sleep on extra pillows at night in order to breathe  Follow cardiac low salt diet and 1.5 lit/day fluid restriction.  If you have diabetes Accuchecks 4 times/day, Once in AM empty stomach and then before each meal. Log in all results and show them to your primary doctor at your next visit. If any glucose reading is under 80 or above 300 call your primary MD immediately.  If you have Seizure/Convulsions/Epilepsy: Please do not drive, operate heavy machinery, participate in activities at heights or participate in high speed sports until you have seen by Primary MD or a Neurologist and advised to do so again.  If you had Gastrointestinal Bleeding: Please ask your Primary MD to check a complete blood count within one week of discharge or at your next visit. Your endoscopic/colonoscopic biopsies that are pending at the time of discharge, will also need to followed by your Primary MD.  Get Medicines reviewed and adjusted. Please take all your  medications with you for your next visit with your Primary MD  Please request your Primary MD to go over all hospital tests and procedure/radiological results at the follow up, please ask your Primary MD to get all Hospital records sent to his/her office.  If you experience worsening of your admission symptoms, develop shortness of breath, life threatening emergency, suicidal or homicidal thoughts you must seek medical attention immediately by calling 911 or calling your MD immediately  if symptoms less severe.  You must read complete instructions/literature along with all the possible adverse reactions/side effects for all the Medicines you take and that have been prescribed to you. Take any new Medicines after you have completely understood and accpet all the possible adverse reactions/side effects.   Do not drive or operate heavy machinery when taking Pain medications.   Do not take more than prescribed Pain, Sleep and Anxiety Medications  Special Instructions: If you have smoked or chewed Tobacco  in the last 2 yrs please stop smoking, stop any regular Alcohol  and or any Recreational drug use.  Wear Seat belts while driving.  Please note You were cared for by a hospitalist during your hospital stay. If you have any questions about your discharge medications or the care you received while you were in the hospital after you are discharged, you can call the unit and asked to speak with the hospitalist on call if the hospitalist that took care of you is not available. Once  you are discharged, your primary care physician will handle any further medical issues. Please note that NO REFILLS for any discharge medications will be authorized once you are discharged, as it is imperative that you return to your primary care physician (or establish a relationship with a primary care physician if you do not have one) for your aftercare needs so that they can reassess your need for medications and monitor your  lab values.  You can reach the hospitalist office at phone [phone] or fax [phone]   If you do not have a primary care physician, you can call [phone] for a physician referral.  Activity: As tolerated with Full fall precautions use walker/cane & assistance as needed  Diet: regular  Disposition Home

## 2014-11-30 NOTE — Progress Notes (Signed)
Subjective: Abdominal pain improving. Tolerating diet.  Objective: Vital signs in last 24 hours: Temp:  [97.8 F (36.6 C)-98.1 F (36.7 C)] 97.8 F (36.6 C) (02/25 0521) Pulse Rate:  [65-70] 65 (02/25 0521) Resp:  [18] 18 (02/25 0521) BP: (95-114)/(62-71) 97/63 mmHg (02/25 0521) SpO2:  [93 %-96 %] 93 % (02/25 0521) Weight change:  Last BM Date: 11/26/14  PE: GEN:  NAD ABD:  Soft  Lab Results: CBC    Component Value Date/Time   WBC 11.7* 11/28/2014 0430   RBC 5.38 11/28/2014 0430   HGB 15.9 11/28/2014 0430   HCT 48.5 11/28/2014 0430   PLT 153 11/28/2014 0430   MCV 90.1 11/28/2014 0430   MCH 29.6 11/28/2014 0430   MCHC 32.8 11/28/2014 0430   RDW 13.5 11/28/2014 0430   LYMPHSABS 2.0 11/26/2014 0809   MONOABS 0.8 11/26/2014 0809   EOSABS 0.1 11/26/2014 0809   BASOSABS 0.0 11/26/2014 0809   CMP     Component Value Date/Time   NA 138 11/28/2014 0430   K 4.1 11/28/2014 0430   CL 108 11/28/2014 0430   CO2 19 11/28/2014 0430   GLUCOSE 92 11/28/2014 0430   BUN 16 11/28/2014 0430   CREATININE 0.96 11/28/2014 0430   CALCIUM 8.4 11/28/2014 0430   PROT 7.7 11/27/2014 0108   ALBUMIN 4.1 11/27/2014 0108   AST 25 11/27/2014 0108   ALT 24 11/27/2014 0108   ALKPHOS 69 11/27/2014 0108   BILITOT 1.0 11/27/2014 0108   GFRNONAA >90 11/28/2014 0430   GFRAA >90 11/28/2014 0430   Gastrin normal; H. Pylori serologies pending  Assessment:  1. Abdominal pain, improving. 2. Hematemesis and chest pain, improving, likely from esophagitis. 3. Duodenitis on CT scan, corresponding to duodenal ulcerations and erosions on endoscopy.  Plan:  1.  Pantoprazole 40 mg po bid now and upon discharge. 2.  Advancing diet. 3.  OK to discharge today; patient needs outpatient follow-up with me in 6-8 weeks. 4.  Will sign-off; please call with questions; thank you for the consult.   Freddy JakschUTLAW,Keali Mccraw M 11/30/2014, 9:09 AM

## 2016-11-23 IMAGING — CR DG CHEST 2V
2 series · 2 of 2 positions shown · non-contrast
Comparison: 08/05/2014

CLINICAL DATA: Nausea vomiting and diarrhea.  Sweats and weakness.

EXAM:
CHEST  2 VIEW

[w chest lat]
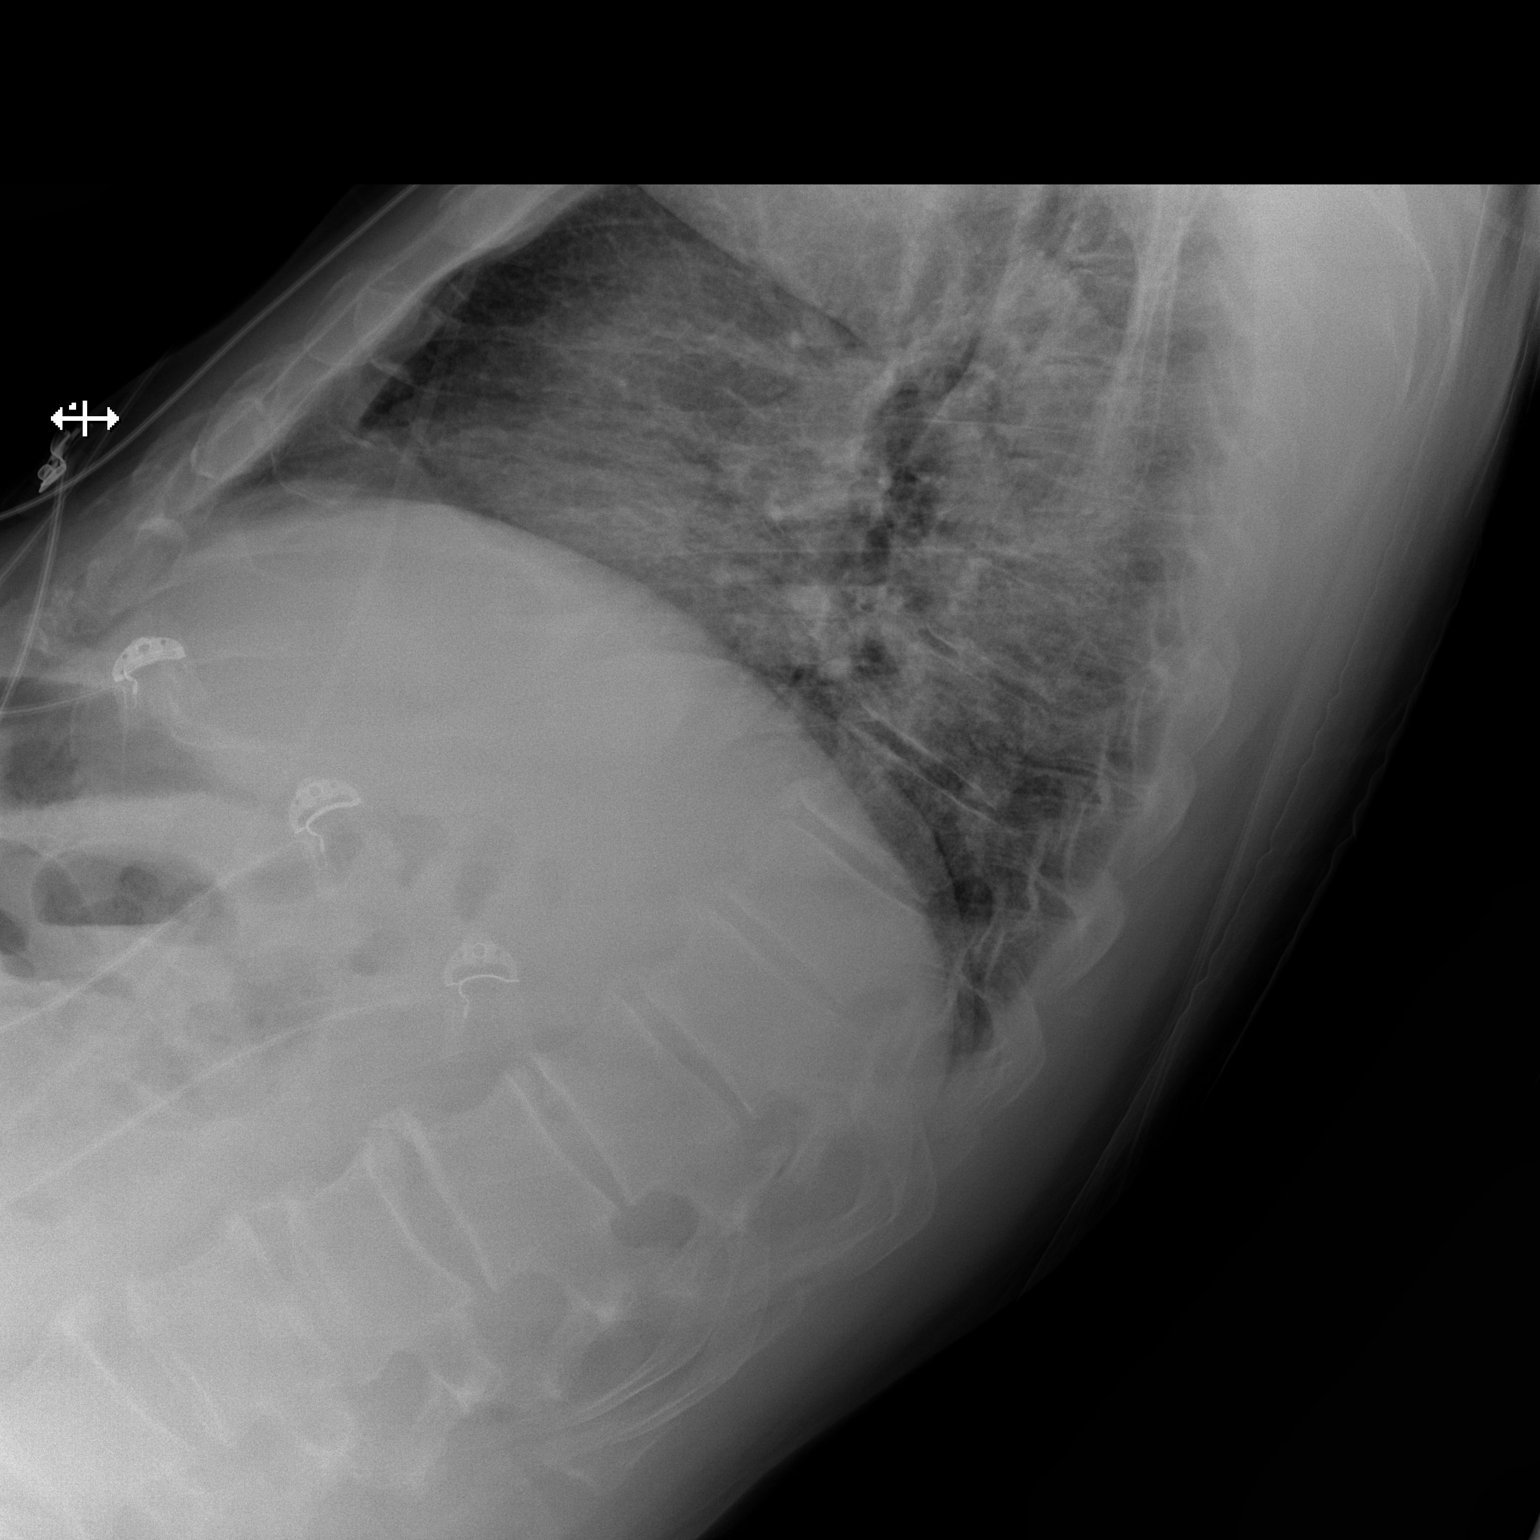

[x chest ap]
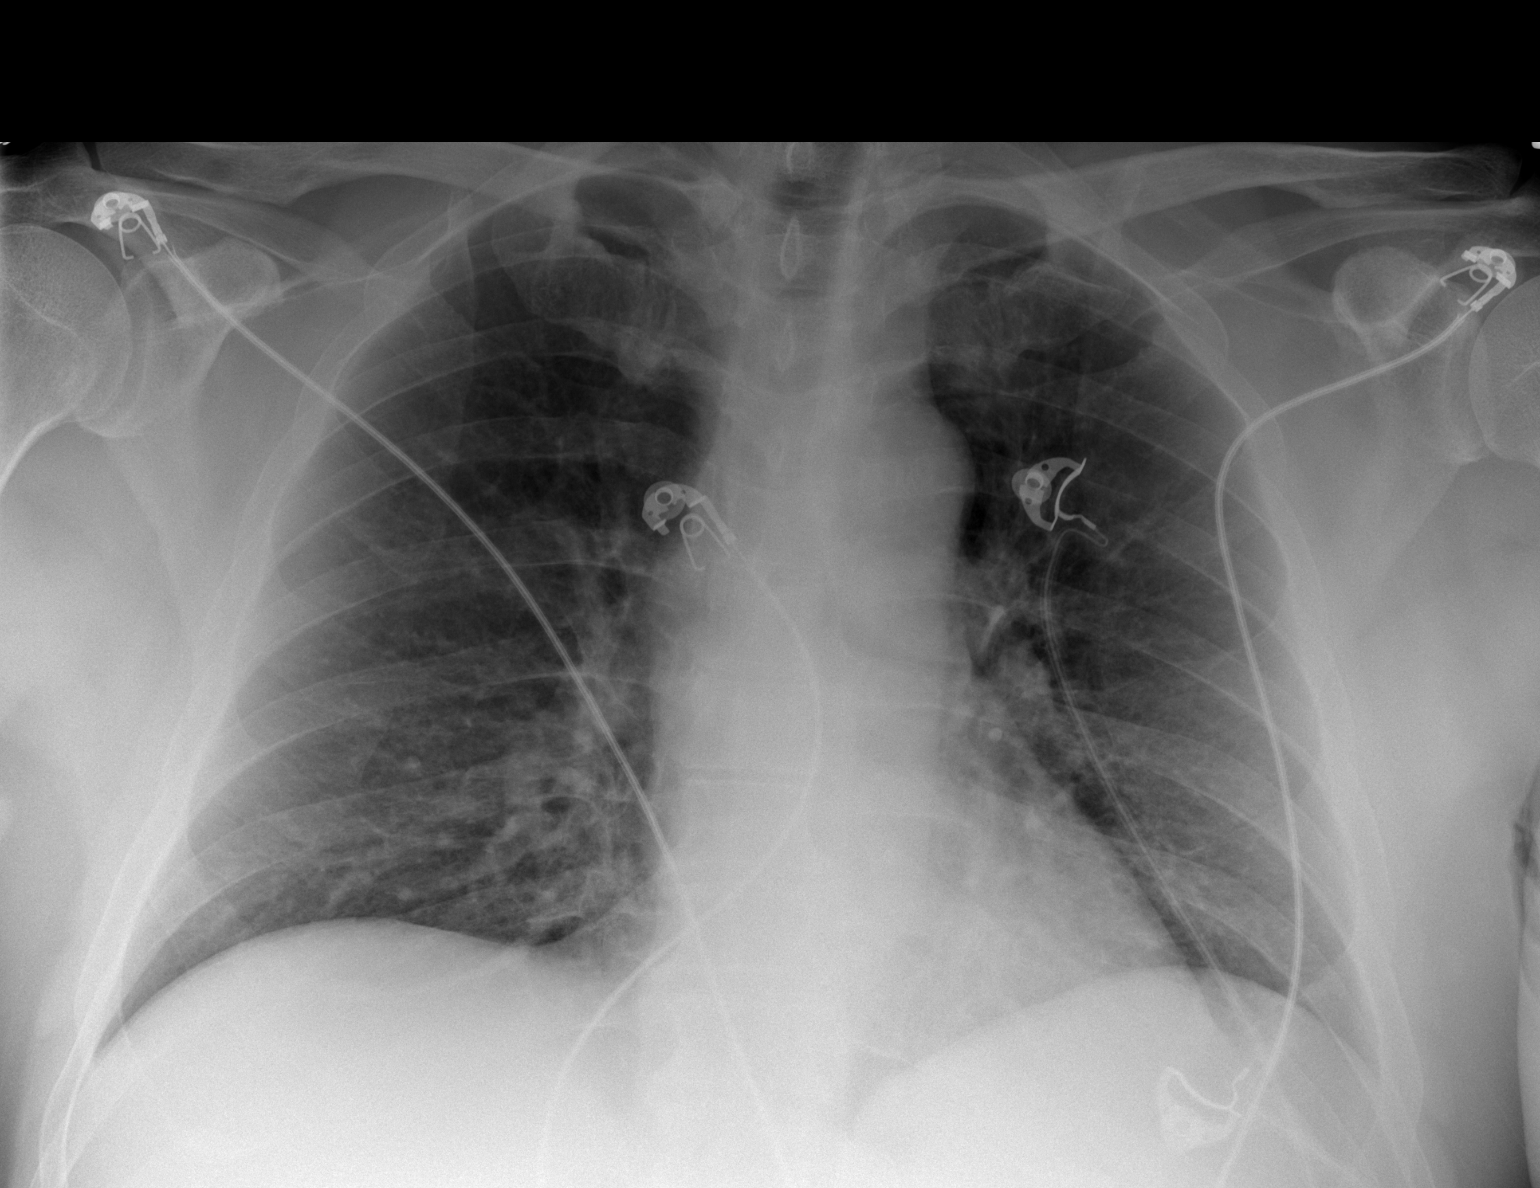

[2 of 2 positions shown; findings below may reference images not displayed]

FINDINGS: The heart size and mediastinal contours are within normal limits.
Both lungs are clear. The visualized skeletal structures are
unremarkable.
IMPRESSION: No active cardiopulmonary disease.

## 2016-11-23 IMAGING — CT CT ANGIO CHEST
1 of 2 series · 19 of 32 positions shown · IV contrast (OMNIPAQUE 350)
Comparison: 11/26/2014, 06/06/2009

CLINICAL DATA: Acute intermittent chest pain with vomiting. History
of pulmonary embolus. Elevated D-dimer.

EXAM:
CT ANGIOGRAPHY CHEST WITH CONTRAST
TECHNIQUE: Multidetector CT imaging of the chest was performed using the
standard protocol during bolus administration of intravenous
contrast. Multiplanar CT image reconstructions and MIPs were
obtained to evaluate the vascular anatomy.
CONTRAST:  100mL OMNIPAQUE IOHEXOL 350 MG/ML SOLN

[Series 6: thins for pacs · axial · 0.67mm/px · z∈[+1487,+1727]mm · 19 of 268 slices shown]
[im 14/268  lung]
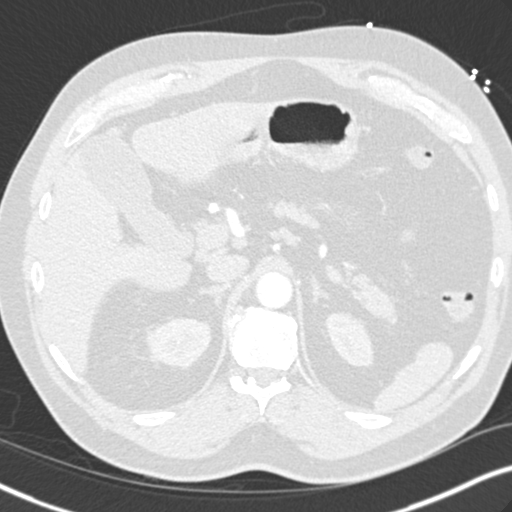
[im 27/268  mediastinal]
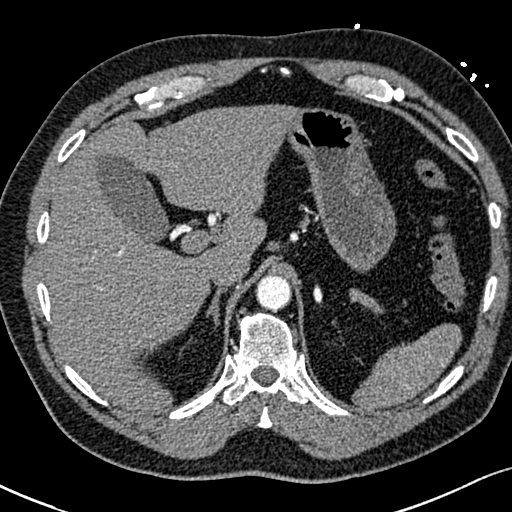
[im 41/268  lung]
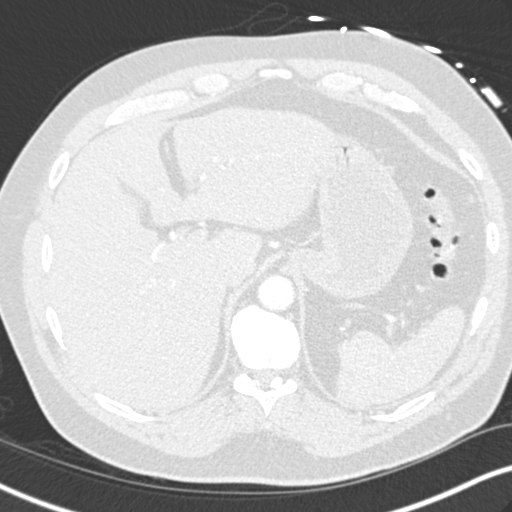
[im 67/268  mediastinal]
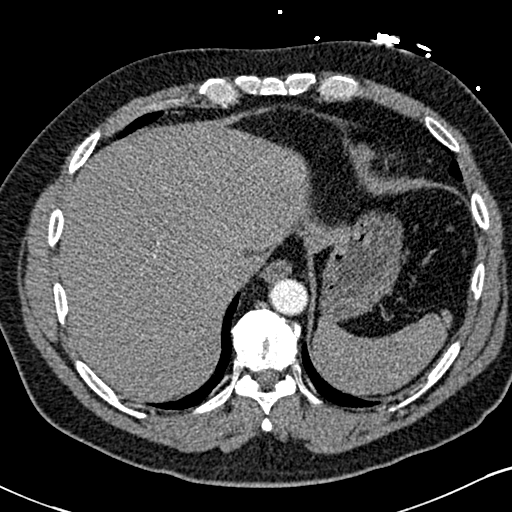
[im 81/268  lung]
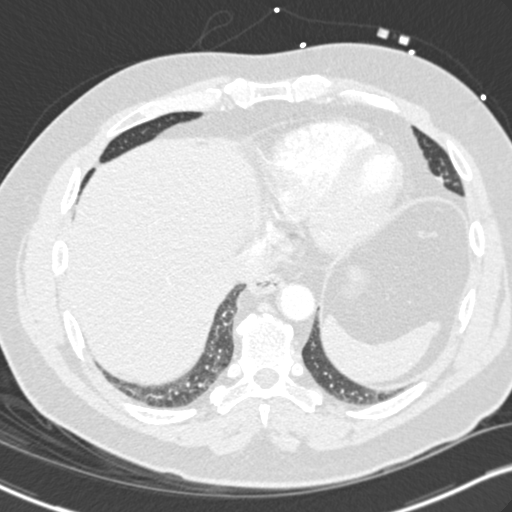
[im 90/268  mediastinal]
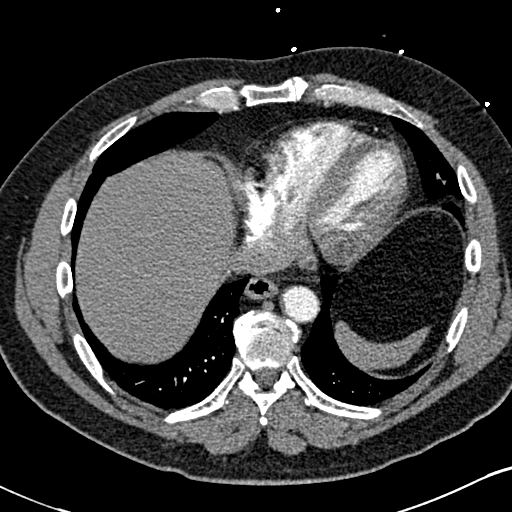
[im 94/268  lung]
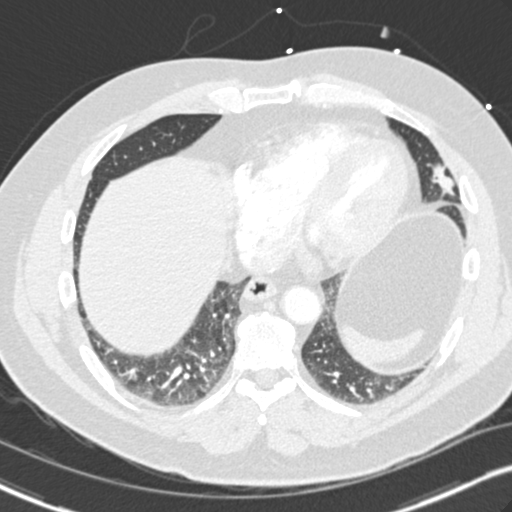
[im 107/268  mediastinal]
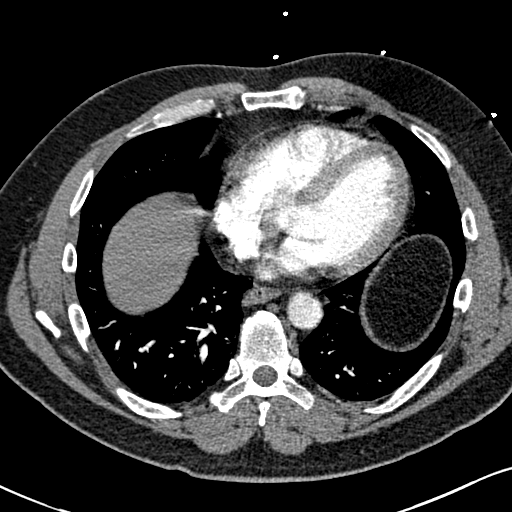
[im 121/268  lung]
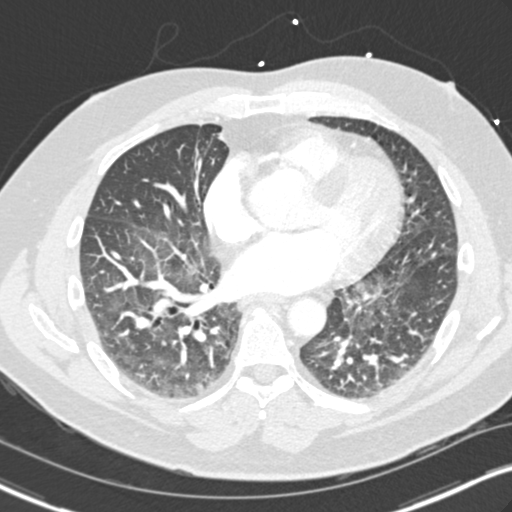
[im 134/268  mediastinal]
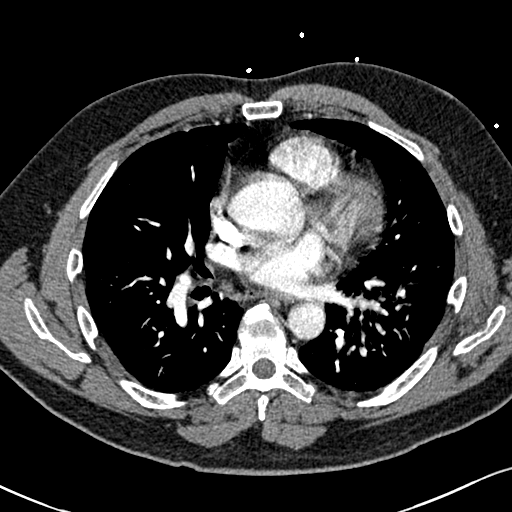
[im 147/268  lung]
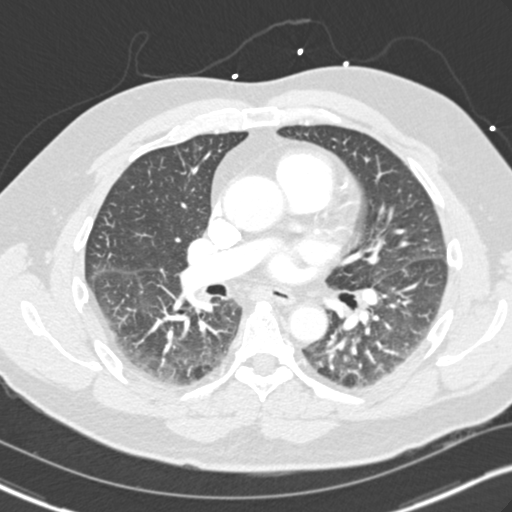
[im 161/268  mediastinal]
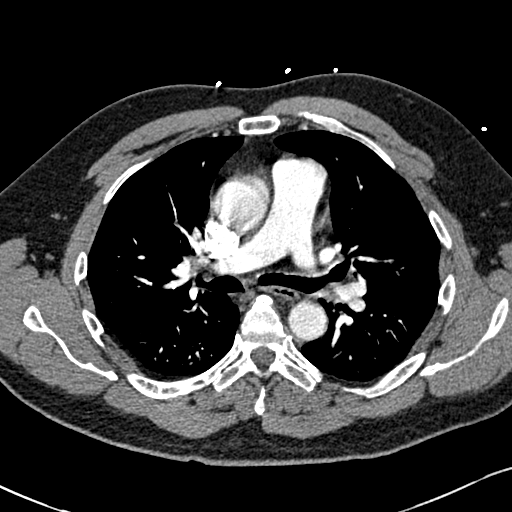
[im 174/268  lung]
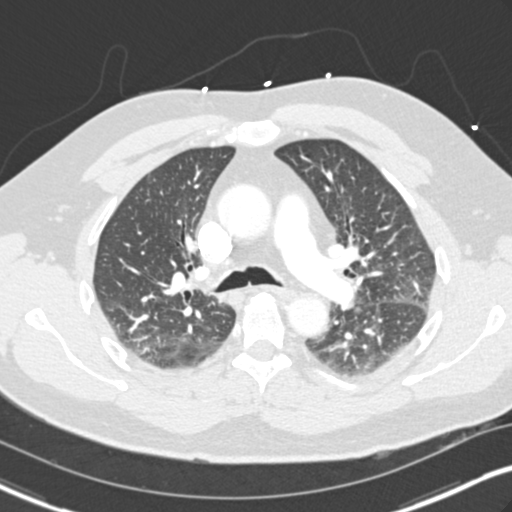
[im 179/268  mediastinal]
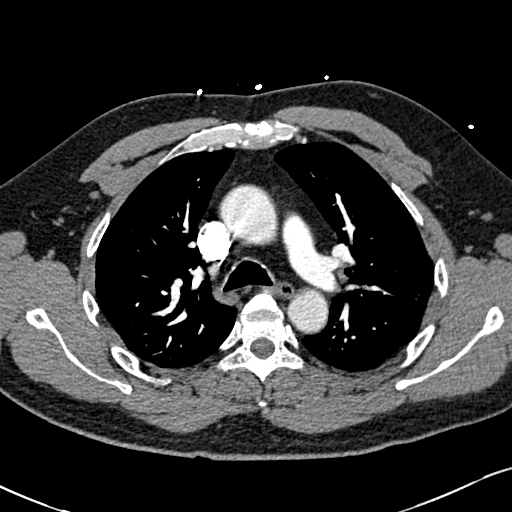
[im 187/268  lung]
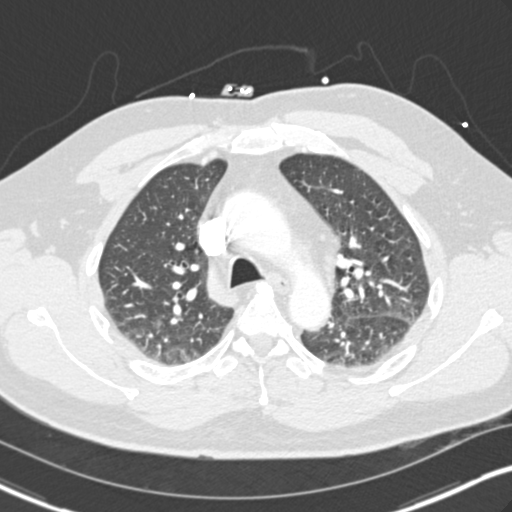
[im 201/268  mediastinal]
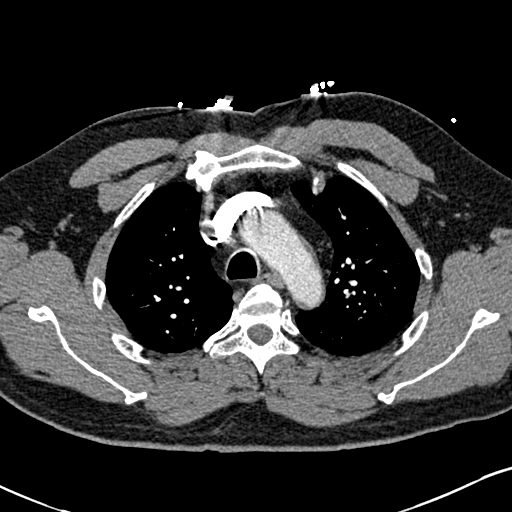
[im 227/268  lung]
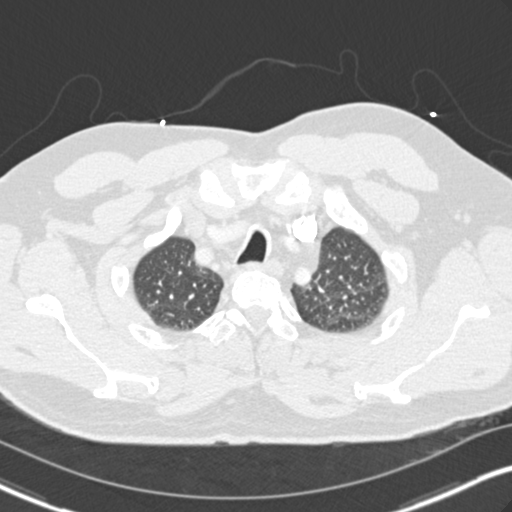
[im 241/268  mediastinal]
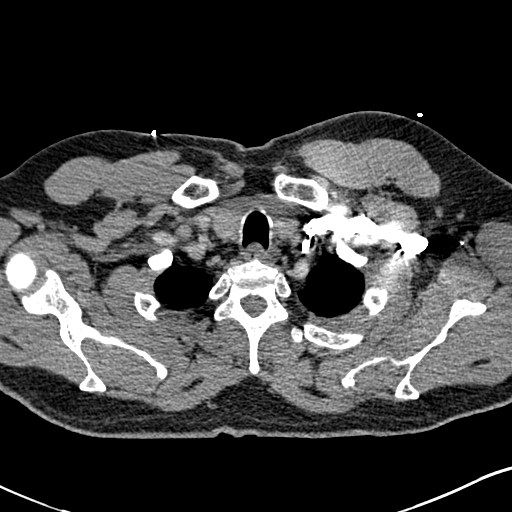
[im 254/268  lung]
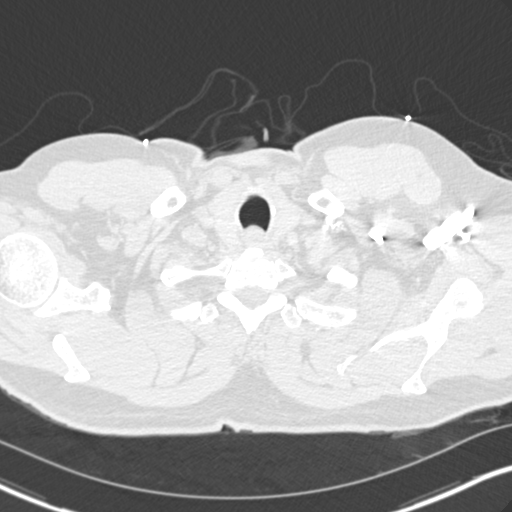

[19 of 32 positions shown; findings below may reference images not displayed]

FINDINGS: Pulmonary arteries are well visualized. No significant filling
defect or pulmonary embolus demonstrated by CTA. Intact thoracic
aorta. Negative for aneurysm or dissection. No mediastinal
hemorrhage or hematoma. No adenopathy. Normal heart size. No
pericardial or pleural effusion. Negative for hiatal hernia.

Lung windows demonstrate overall low volumes with bibasilar vascular
crowding and hypoventilatory changes. Scarring or atelectasis in the
inferior lingula and medial right middle lobe. Trachea and central
airways are patent. Stable well-circumscribed sub cm nodules in the
medial right upper lobe, images 27 and 29. Stable punctate sub cm
nodule in the lingula, image 47.

Diffuse degenerative changes of the thoracic spine. No acute osseous
finding.

Included upper abdomen demonstrates scattered sub cm hypodensities
throughout the liver, suspect small hepatic cysts. These are too
small to definitively characterize. No biliary dilatation. No acute
upper abdominal findings.

Review of the MIP images confirms the above findings.
IMPRESSION: Negative for significant acute pulmonary embolus CTA.

Low lung volumes with bibasilar hypoventilatory changes and
atelectasis.

Stable right upper lobe and lingula sub cm pulmonary nodules
compared to 06/06/2009 compatible with benign nodules.
